# Patient Record
Sex: Female | Born: 1950 | Race: White | Hispanic: No | Marital: Married | Smoking: Never smoker
Health system: Southern US, Community
[De-identification: ages and names within clinical notes are randomized; demographics above are authoritative.]

## PROBLEM LIST (undated history)

## (undated) DIAGNOSIS — E119 Type 2 diabetes mellitus without complications: Secondary | ICD-10-CM

## (undated) DIAGNOSIS — M1990 Unspecified osteoarthritis, unspecified site: Secondary | ICD-10-CM

## (undated) DIAGNOSIS — R112 Nausea with vomiting, unspecified: Secondary | ICD-10-CM

## (undated) DIAGNOSIS — Z9889 Other specified postprocedural states: Secondary | ICD-10-CM

## (undated) DIAGNOSIS — T7840XA Allergy, unspecified, initial encounter: Secondary | ICD-10-CM

## (undated) DIAGNOSIS — F329 Major depressive disorder, single episode, unspecified: Secondary | ICD-10-CM

## (undated) DIAGNOSIS — F419 Anxiety disorder, unspecified: Secondary | ICD-10-CM

## (undated) DIAGNOSIS — H269 Unspecified cataract: Secondary | ICD-10-CM

## (undated) DIAGNOSIS — J449 Chronic obstructive pulmonary disease, unspecified: Secondary | ICD-10-CM

## (undated) DIAGNOSIS — J45909 Unspecified asthma, uncomplicated: Secondary | ICD-10-CM

## (undated) DIAGNOSIS — IMO0002 Reserved for concepts with insufficient information to code with codable children: Secondary | ICD-10-CM

## (undated) DIAGNOSIS — E785 Hyperlipidemia, unspecified: Secondary | ICD-10-CM

## (undated) DIAGNOSIS — I10 Essential (primary) hypertension: Secondary | ICD-10-CM

## (undated) DIAGNOSIS — F32A Depression, unspecified: Secondary | ICD-10-CM

## (undated) HISTORY — DX: Unspecified asthma, uncomplicated: J45.909

## (undated) HISTORY — PX: COLONOSCOPY WITH ESOPHAGOGASTRODUODENOSCOPY (EGD): SHX5779

## (undated) HISTORY — DX: Reserved for concepts with insufficient information to code with codable children: IMO0002

## (undated) HISTORY — PX: BREAST SURGERY: SHX581

## (undated) HISTORY — DX: Chronic obstructive pulmonary disease, unspecified: J44.9

## (undated) HISTORY — DX: Essential (primary) hypertension: I10

## (undated) HISTORY — PX: HAND SURGERY: SHX662

## (undated) HISTORY — DX: Unspecified cataract: H26.9

## (undated) HISTORY — PX: KNEE SURGERY: SHX244

## (undated) HISTORY — PX: EYE SURGERY: SHX253

## (undated) HISTORY — DX: Type 2 diabetes mellitus without complications: E11.9

## (undated) HISTORY — DX: Hyperlipidemia, unspecified: E78.5

## (undated) HISTORY — DX: Allergy, unspecified, initial encounter: T78.40XA

## (undated) HISTORY — DX: Major depressive disorder, single episode, unspecified: F32.9

## (undated) HISTORY — DX: Unspecified osteoarthritis, unspecified site: M19.90

## (undated) HISTORY — PX: BACK SURGERY: SHX140

## (undated) HISTORY — DX: Anxiety disorder, unspecified: F41.9

## (undated) HISTORY — DX: Depression, unspecified: F32.A

---

## 1997-11-16 ENCOUNTER — Ambulatory Visit (HOSPITAL_COMMUNITY): Admission: RE | Admit: 1997-11-16 | Discharge: 1997-11-16 | Payer: Self-pay | Admitting: Neurological Surgery

## 1997-12-30 ENCOUNTER — Encounter: Payer: Self-pay | Admitting: Neurological Surgery

## 1997-12-30 ENCOUNTER — Ambulatory Visit (HOSPITAL_COMMUNITY): Admission: RE | Admit: 1997-12-30 | Discharge: 1997-12-30 | Payer: Self-pay | Admitting: Neurological Surgery

## 1998-02-16 ENCOUNTER — Emergency Department (HOSPITAL_COMMUNITY): Admission: EM | Admit: 1998-02-16 | Discharge: 1998-02-16 | Payer: Self-pay | Admitting: Emergency Medicine

## 1998-12-07 ENCOUNTER — Other Ambulatory Visit: Admission: RE | Admit: 1998-12-07 | Discharge: 1998-12-07 | Payer: Self-pay | Admitting: Gynecology

## 1999-12-06 ENCOUNTER — Encounter: Payer: Self-pay | Admitting: Gynecology

## 1999-12-06 ENCOUNTER — Encounter: Admission: RE | Admit: 1999-12-06 | Discharge: 1999-12-06 | Payer: Self-pay | Admitting: Gynecology

## 2000-01-11 ENCOUNTER — Other Ambulatory Visit: Admission: RE | Admit: 2000-01-11 | Discharge: 2000-01-11 | Payer: Self-pay | Admitting: Gynecology

## 2000-10-16 ENCOUNTER — Encounter: Admission: RE | Admit: 2000-10-16 | Discharge: 2000-10-16 | Payer: Self-pay | Admitting: Neurological Surgery

## 2000-10-16 ENCOUNTER — Encounter: Payer: Self-pay | Admitting: Neurological Surgery

## 2001-03-17 ENCOUNTER — Other Ambulatory Visit: Admission: RE | Admit: 2001-03-17 | Discharge: 2001-03-17 | Payer: Self-pay | Admitting: Gynecology

## 2002-04-01 ENCOUNTER — Encounter: Admission: RE | Admit: 2002-04-01 | Discharge: 2002-04-01 | Payer: Self-pay | Admitting: Gynecology

## 2002-04-01 ENCOUNTER — Encounter: Payer: Self-pay | Admitting: Gynecology

## 2002-04-05 ENCOUNTER — Other Ambulatory Visit: Admission: RE | Admit: 2002-04-05 | Discharge: 2002-04-05 | Payer: Self-pay | Admitting: Gynecology

## 2002-12-07 ENCOUNTER — Encounter: Payer: Self-pay | Admitting: Family Medicine

## 2002-12-07 ENCOUNTER — Encounter: Admission: RE | Admit: 2002-12-07 | Discharge: 2002-12-07 | Payer: Self-pay | Admitting: Family Medicine

## 2003-07-25 ENCOUNTER — Other Ambulatory Visit: Admission: RE | Admit: 2003-07-25 | Discharge: 2003-07-25 | Payer: Self-pay | Admitting: Gynecology

## 2003-07-27 ENCOUNTER — Encounter: Admission: RE | Admit: 2003-07-27 | Discharge: 2003-07-27 | Payer: Self-pay | Admitting: Gynecology

## 2004-11-30 ENCOUNTER — Encounter: Admission: RE | Admit: 2004-11-30 | Discharge: 2004-11-30 | Payer: Self-pay | Admitting: Gynecology

## 2004-12-04 ENCOUNTER — Other Ambulatory Visit: Admission: RE | Admit: 2004-12-04 | Discharge: 2004-12-04 | Payer: Self-pay | Admitting: Gynecology

## 2005-12-30 ENCOUNTER — Other Ambulatory Visit: Admission: RE | Admit: 2005-12-30 | Discharge: 2005-12-30 | Payer: Self-pay | Admitting: Gynecology

## 2006-01-15 ENCOUNTER — Encounter: Admission: RE | Admit: 2006-01-15 | Discharge: 2006-01-15 | Payer: Self-pay | Admitting: Gynecology

## 2006-05-13 LAB — HM COLONOSCOPY: HM Colonoscopy: NORMAL

## 2007-04-13 LAB — HM PAP SMEAR: HM Pap smear: NORMAL

## 2007-07-20 ENCOUNTER — Ambulatory Visit: Payer: Self-pay | Admitting: Internal Medicine

## 2007-07-20 DIAGNOSIS — J209 Acute bronchitis, unspecified: Secondary | ICD-10-CM | POA: Insufficient documentation

## 2007-07-20 DIAGNOSIS — J45909 Unspecified asthma, uncomplicated: Secondary | ICD-10-CM | POA: Insufficient documentation

## 2007-07-20 LAB — CONVERTED CEMR LAB
Basophils Relative: 0.6 % (ref 0.0–1.0)
Eosinophils Absolute: 0.5 10*3/uL (ref 0.0–0.6)
Eosinophils Relative: 7.7 % — ABNORMAL HIGH (ref 0.0–5.0)
Hemoglobin: 12.6 g/dL (ref 12.0–15.0)
Neutro Abs: 2.9 10*3/uL (ref 1.4–7.7)
Platelets: 222 10*3/uL (ref 150–400)
RDW: 12.8 % (ref 11.5–14.6)
WBC: 6.5 10*3/uL (ref 4.5–10.5)

## 2007-09-08 ENCOUNTER — Encounter: Payer: Self-pay | Admitting: Internal Medicine

## 2007-09-08 ENCOUNTER — Ambulatory Visit: Payer: Self-pay | Admitting: Internal Medicine

## 2007-09-08 DIAGNOSIS — J309 Allergic rhinitis, unspecified: Secondary | ICD-10-CM | POA: Insufficient documentation

## 2007-10-19 ENCOUNTER — Encounter: Admission: RE | Admit: 2007-10-19 | Discharge: 2007-10-19 | Payer: Self-pay | Admitting: Internal Medicine

## 2007-10-22 ENCOUNTER — Ambulatory Visit: Payer: Self-pay

## 2008-06-20 LAB — TSH: TSH: 0.39 u[IU]/mL — AB (ref <=5.90)

## 2009-03-20 ENCOUNTER — Encounter: Admission: RE | Admit: 2009-03-20 | Discharge: 2009-03-20 | Payer: Self-pay | Admitting: Gynecology

## 2010-06-03 ENCOUNTER — Encounter: Payer: Self-pay | Admitting: Gynecology

## 2010-06-13 ENCOUNTER — Encounter: Payer: Self-pay | Admitting: Gynecology

## 2010-08-14 ENCOUNTER — Other Ambulatory Visit: Payer: Self-pay | Admitting: Gynecology

## 2010-08-14 DIAGNOSIS — R928 Other abnormal and inconclusive findings on diagnostic imaging of breast: Secondary | ICD-10-CM

## 2010-08-23 ENCOUNTER — Ambulatory Visit
Admission: RE | Admit: 2010-08-23 | Discharge: 2010-08-23 | Disposition: A | Discharge status: Home / Self Care | Payer: 59 | Source: Ambulatory Visit | Attending: Gynecology | Admitting: Gynecology

## 2010-08-23 DIAGNOSIS — R928 Other abnormal and inconclusive findings on diagnostic imaging of breast: Secondary | ICD-10-CM

## 2011-02-11 LAB — LIPID PANEL
Cholesterol: 254 mg/dL — AB (ref 0–200)
HDL: 51 mg/dL (ref 35–70)
LDL Cholesterol: 166 mg/dL
Triglycerides: 187 mg/dL — AB (ref 40–160)

## 2011-02-11 LAB — HEPATIC FUNCTION PANEL
ALT: 22 U/L (ref 7–35)
AST: 23 U/L (ref 13–35)

## 2011-04-22 ENCOUNTER — Ambulatory Visit (INDEPENDENT_AMBULATORY_CARE_PROVIDER_SITE_OTHER): Payer: 59

## 2011-04-22 DIAGNOSIS — Z23 Encounter for immunization: Secondary | ICD-10-CM

## 2011-04-22 DIAGNOSIS — H268 Other specified cataract: Secondary | ICD-10-CM

## 2011-04-22 DIAGNOSIS — Z0000 Encounter for general adult medical examination without abnormal findings: Secondary | ICD-10-CM

## 2011-08-07 ENCOUNTER — Other Ambulatory Visit: Payer: Self-pay | Admitting: Family Medicine

## 2011-08-08 ENCOUNTER — Other Ambulatory Visit: Payer: Self-pay | Admitting: Physician Assistant

## 2011-09-04 ENCOUNTER — Other Ambulatory Visit: Payer: Self-pay | Admitting: Family Medicine

## 2011-09-05 ENCOUNTER — Telehealth: Payer: Self-pay | Admitting: Internal Medicine

## 2011-09-05 MED ORDER — ALPRAZOLAM 1 MG PO TABS: 1.0000 mg | ORAL_TABLET | Freq: Every evening | ORAL | Status: AC | PRN

## 2011-09-05 NOTE — Telephone Encounter (Signed)
Xanax 1 mg #30 refilled

## 2011-09-30 ENCOUNTER — Other Ambulatory Visit: Payer: Self-pay | Admitting: Family Medicine

## 2011-10-02 ENCOUNTER — Ambulatory Visit (INDEPENDENT_AMBULATORY_CARE_PROVIDER_SITE_OTHER): Payer: 59 | Admitting: Family Medicine

## 2011-10-02 VITALS — BP 142/80 | HR 78 | Temp 98.8°F | Resp 16 | Ht 62.0 in | Wt 163.0 lb

## 2011-10-02 DIAGNOSIS — L0291 Cutaneous abscess, unspecified: Secondary | ICD-10-CM

## 2011-10-02 DIAGNOSIS — R52 Pain, unspecified: Secondary | ICD-10-CM

## 2011-10-02 MED ORDER — TRAMADOL HCL 50 MG PO TABS: 50.0000 mg | ORAL_TABLET | Freq: Three times a day (TID) | ORAL | Status: AC | PRN

## 2011-10-02 MED ORDER — CEPHALEXIN 500 MG PO CAPS: 500.0000 mg | ORAL_CAPSULE | Freq: Four times a day (QID) | ORAL | Status: DC

## 2011-10-02 MED ORDER — DOXYCYCLINE HYCLATE 100 MG PO TABS: 100.0000 mg | ORAL_TABLET | Freq: Two times a day (BID) | ORAL | Status: AC

## 2011-10-02 NOTE — Progress Notes (Signed)
  [facility]:  Office Visit  Chief Complaint:  Chief Complaint  Patient presents with  . Cellulitis    Left hand    HPI: Jodi Williams is a 61 y.o. female who complains of  4 day hisotry of wound on left hand near thumb, started out as pimple like, then looked like there might be fluid/pus underneath, patient took a pin needle to it to drain fluid. + pain, + redness. No abx or OTC treatment.   No past medical history on file. No past surgical history on file. History   Social History  . Marital Status: Married    Spouse Name: N/A    Number of Children: N/A  . Years of Education: N/A   Social History Main Topics  . Smoking status: Never Smoker   . Smokeless tobacco: None  . Alcohol Use: None  . Drug Use: None  . Sexually Active: None   Other Topics Concern  . None   Social History Narrative  . None   No family history on file. No Known Allergies Prior to Admission medications   Medication Sig Start Date End Date Taking? Authorizing Provider  ALPRAZolam Prudy Feeler) 1 MG tablet Take 1 tablet (1 mg total) by mouth at bedtime as needed for sleep. 09/05/11 10/05/11 Yes Rickard Patience, PA-C  buPROPion [facility] SR) 150 MG 12 hr tablet Take 1 tablet (150 mg total) by mouth 2 (two) times daily. NEEDS OFFICE VISIT 09/30/11  Yes Ryan M Dunn, PA-C  citalopram (CELEXA) 20 MG tablet TAKE 2 TABLETS BY MOUTH EVERY DAY 08/07/11  Yes Pattricia Boss, PA-C     ROS: The patient denies fevers, chills, night sweats, unintentional weight loss, chest pain, palpitations, wheezing, dyspnea on exertion, nausea, vomiting, abdominal pain, dysuria, hematuria, melena, numbness, weakness, or tingling. + wound  All other systems have been reviewed and were otherwise negative with the exception of those mentioned in the HPI and as above.    PHYSICAL EXAM: Filed Vitals:   10/02/11 1922  BP: 142/80  Pulse: 78  Temp: 98.8 F (37.1 C)  Resp: 16   Filed Vitals:   10/02/11 1922    Height: 5\' 2"  (1.575 m)  Weight: 163 lb (73.936 kg)   Body mass index is 29.81 kg/(m^2).  General: Alert, no acute distress HEENT:  Normocephalic, atraumatic, oropharynx patent.  Cardiovascular:  Regular rate and rhythm, no rubs murmurs or gallops.  No Carotid bruits, radial pulse intact. No pedal edema.  Respiratory: Clear to auscultation bilaterally.  No wheezes, rales, or rhonchi.  No cyanosis, no use of accessory musculature GI: No organomegaly, abdomen is soft and non-tender, positive bowel sounds.  No masses. Skin: +  2 by 2 cm well circumscribed abscess on left dorsum of hand at hyperthenar eminence. + pustular drainage, erythematous, tender Neurologic: Facial musculature symmetric. Radial pulses intact, full ROM Psychiatric: Patient is appropriate throughout our interaction. Lymphatic: No cervical lymphadenopathy Musculoskeletal: Gait intact.   LABS:    EKG/XRAY:   Primary read interpreted by Dr. Conley Rolls at [facility].   ASSESSMENT/PLAN: Encounter Diagnoses  Name Primary?  . Cellulitis and abscess Yes  . Pain    Patient declined I&D She was willing to allow [place] to get a wound cx ( she nearly passed out in the process) Rx Doxycycline 100 mg BID and also Tramadol for pain.     Deloria Brassfield PHUONG, DO 10/03/2011 1:59 AM

## 2011-10-03 ENCOUNTER — Encounter: Payer: Self-pay | Admitting: Family Medicine

## 2011-10-06 ENCOUNTER — Telehealth: Payer: Self-pay | Admitting: Family Medicine

## 2011-10-06 LAB — WOUND CULTURE
Gram Stain: NONE SEEN
Gram Stain: NONE SEEN
Gram Stain: NONE SEEN

## 2011-10-06 NOTE — Telephone Encounter (Signed)
Spoke with patient regarding wound cx, she is feeling better, cellulitis is improved.  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

## 2011-10-08 ENCOUNTER — Telehealth: Payer: Self-pay

## 2011-10-08 NOTE — Telephone Encounter (Signed)
Pt has mersa and Dr Patsy Lager told her not to go get her nails done she has a dental appt tomorrow should she still go to that?

## 2011-10-09 NOTE — Telephone Encounter (Signed)
Patient was dx with mrsa on 5/22 and would like to know if she is ok to go to dental app

## 2011-10-09 NOTE — Telephone Encounter (Signed)
Spoke with patient and let her know that it was ok to go to dentist.

## 2011-10-09 NOTE — Telephone Encounter (Signed)
Yes this is fine. Keep wound covered and continue medication

## 2011-10-14 ENCOUNTER — Other Ambulatory Visit: Payer: Self-pay | Admitting: Internal Medicine

## 2011-11-02 ENCOUNTER — Other Ambulatory Visit: Payer: Self-pay | Admitting: Physician Assistant

## 2011-11-13 ENCOUNTER — Other Ambulatory Visit: Payer: Self-pay | Admitting: Physician Assistant

## 2011-11-13 MED ORDER — ALPRAZOLAM 1 MG PO TABS: ORAL_TABLET | ORAL | Status: DC

## 2011-11-28 ENCOUNTER — Ambulatory Visit (INDEPENDENT_AMBULATORY_CARE_PROVIDER_SITE_OTHER): Payer: 59 | Admitting: Family Medicine

## 2011-11-28 VITALS — BP 114/72

## 2011-11-28 DIAGNOSIS — F329 Major depressive disorder, single episode, unspecified: Secondary | ICD-10-CM

## 2011-11-28 DIAGNOSIS — M25529 Pain in unspecified elbow: Secondary | ICD-10-CM

## 2011-11-28 DIAGNOSIS — F32A Depression, unspecified: Secondary | ICD-10-CM

## 2011-11-28 DIAGNOSIS — M7710 Lateral epicondylitis, unspecified elbow: Secondary | ICD-10-CM

## 2011-11-28 DIAGNOSIS — I10 Essential (primary) hypertension: Secondary | ICD-10-CM

## 2011-11-28 DIAGNOSIS — F3289 Other specified depressive episodes: Secondary | ICD-10-CM

## 2011-11-28 MED ORDER — TRIAMTERENE-HCTZ 37.5-25 MG PO TABS: 1.0000 | ORAL_TABLET | Freq: Every day | ORAL | Status: DC

## 2011-11-28 MED ORDER — CITALOPRAM HYDROBROMIDE 20 MG PO TABS: 40.0000 mg | ORAL_TABLET | Freq: Every day | ORAL | Status: DC

## 2011-11-28 MED ORDER — CELECOXIB 100 MG PO CAPS: 100.0000 mg | ORAL_CAPSULE | Freq: Two times a day (BID) | ORAL | Status: DC

## 2011-11-28 MED ORDER — HYDROCODONE-ACETAMINOPHEN 5-500 MG PO TABS: 1.0000 | ORAL_TABLET | Freq: Three times a day (TID) | ORAL | Status: AC | PRN

## 2011-11-28 NOTE — Patient Instructions (Addendum)
Please let me know if your elbow is not better soon.  You may try applying ice and using a tennis elbow strap or band (available at drug stores).  You can probably call [facility] and make an appt yourself if you do not improve.

## 2011-11-28 NOTE — Progress Notes (Signed)
[facility] [address], [place] [address] [phone]  Date:  11/28/2011   Name:  Jodi Williams   DOB:  1950/10/18   MRN:  [id number]  PCP:  No primary provider on file.    Chief Complaint: Arm Pain   History of Present Illness:  Jodi Williams is a 61 y.o. very pleasant female patient who presents with the following:  She noted some pain in her left elbow a couple of months ago.  He pain has now progressed to hurting up and down her arm, mostly on the radial side.  Her hands/ fingers are normal- no numbness or weakness.  No known injury.  She does tend to carry a lot of things in her left hand.  Shoulder is ok.  The pain is affecting her sleep and she would like something to help with this- received ultram recently and it did not seem to give her any pain relief.   Patient Active Problem List  Diagnosis  . ACUTE BRONCHITIS  . ALLERGIC RHINITIS WITH CONJUNCTIVITIS  . ASTHMA    Past Medical History  Diagnosis Date  . Depression   . Anxiety     No past surgical history on file.  History  Substance Use Topics  . Smoking status: Never Smoker   . Smokeless tobacco: Not on file  . Alcohol Use: Not on file    No family history on file.  No Known Allergies  Medication list has been reviewed and updated.  Current Outpatient Prescriptions on File Prior to Visit  Medication Sig Dispense Refill  . ALPRAZolam (XANAX) 1 MG tablet 1 hs prn  30 tablet  0  . buPROPion (WELLBUTRIN SR) 150 MG 12 hr tablet TAKE 1 TABLET BY MOUTH TWICE DAILY  60 tablet  0  . citalopram (CELEXA) 20 MG tablet TAKE 2 TABLETS BY MOUTH EVERY DAY  60 tablet  1  . RABEprazole (ACIPHEX) 20 MG tablet Take 20 mg by mouth daily.      Marland Kitchen triamterene-hydrochlorothiazide (MAXZIDE-25) 37.5-25 MG per tablet Take 1 tablet by mouth daily.        Review of Systems:  As per HPI- otherwise negative.   Physical Examination: Filed Vitals:   11/28/11 1521  BP: 114/72   There were no vitals  filed for this visit. There is no height or weight on file to calculate BMI. Ideal Body Weight:    GEN: WDWN, NAD, Non-toxic, A & O x 3 HEENT: Atraumatic, Normocephalic. Neck supple. No masses, No LAD. Ears and Nose: No external deformity. CV: RRR, No M/G/R. No JVD. No thrill. No extra heart sounds. PULM: CTA B, no wheezes, crackles, rhonchi. No retractions. No resp. distress. No accessory muscle use. EXTR: No c/c/e NEURO Normal gait.  PSYCH: Normally interactive. Conversant. Not depressed or anxious appearing.  Calm demeanor.  Left arm: there is tenderness over her lateral epicondlye.  She also has pain with resisted supination- otherwise normal elbow and shoulder ROM, normal strength, sensation, and biceps DTR.   Assessment and Plan: 1. Lateral epicondylitis  celecoxib (CELEBREX) 100 MG capsule  2. Depression  citalopram (CELEXA) 20 MG tablet  3. HTN (hypertension)  triamterene-hydrochlorothiazide (MAXZIDE-25) 37.5-25 MG per tablet  4. Elbow pain  HYDROcodone-acetaminophen (VICODIN) 5-500 MG per tablet, celecoxib (CELEBREX) 100 MG capsule   Refilled other medications as above per her request. She may take her celebrex BID temporarily if needed.  Did give a few vicodin as well per her request.  Went over treatment of tennis elbow- she wants to try conservative treatment first.  Gave handout of exercises for lateral epicondylitis.  If not better she will consider seeing ortho for an injection.  Her husband is a patient at [facility] orth[facility] and she would like to go there.   Abbe Amsterdam, MD

## 2011-12-14 ENCOUNTER — Other Ambulatory Visit: Payer: Self-pay | Admitting: Physician Assistant

## 2012-01-13 ENCOUNTER — Telehealth: Payer: Self-pay | Admitting: Physician Assistant

## 2012-01-15 ENCOUNTER — Other Ambulatory Visit: Payer: Self-pay | Admitting: Family Medicine

## 2012-02-04 ENCOUNTER — Other Ambulatory Visit: Payer: Self-pay | Admitting: Physician Assistant

## 2012-02-10 ENCOUNTER — Other Ambulatory Visit: Payer: Self-pay | Admitting: Physician Assistant

## 2012-02-11 ENCOUNTER — Telehealth: Payer: Self-pay

## 2012-02-11 NOTE — Telephone Encounter (Signed)
Pt called req RF of her Xanax. Herbert Seta is responding to RF request. Called in Rx to pharm

## 2012-03-08 ENCOUNTER — Other Ambulatory Visit: Payer: Self-pay | Admitting: Family Medicine

## 2012-03-13 ENCOUNTER — Other Ambulatory Visit: Payer: Self-pay | Admitting: Gynecology

## 2012-03-13 DIAGNOSIS — Z1231 Encounter for screening mammogram for malignant neoplasm of breast: Secondary | ICD-10-CM

## 2012-03-13 DIAGNOSIS — Z9889 Other specified postprocedural states: Secondary | ICD-10-CM

## 2012-03-15 ENCOUNTER — Telehealth: Payer: Self-pay

## 2012-03-15 MED ORDER — ALPRAZOLAM 1 MG PO TABS: 1.0000 mg | ORAL_TABLET | Freq: Every evening | ORAL | Status: DC | PRN

## 2012-03-15 NOTE — Telephone Encounter (Signed)
No electronic request received from her pharmacy.  Rx printed.

## 2012-03-15 NOTE — Telephone Encounter (Signed)
Pt has called her pharmacy and requested a rx refill on xanax and she has  Heard no response. Pt states she cant sleep without them please contact pt to advise. [phone]

## 2012-03-16 NOTE — Telephone Encounter (Signed)
Called in RX to [place], called pt to let her know, LMOM.

## 2012-04-02 ENCOUNTER — Ambulatory Visit
Admission: RE | Admit: 2012-04-02 | Discharge: 2012-04-02 | Disposition: A | Discharge status: Home / Self Care | Payer: 59 | Source: Ambulatory Visit | Attending: Gynecology | Admitting: Gynecology

## 2012-04-02 DIAGNOSIS — Z9889 Other specified postprocedural states: Secondary | ICD-10-CM

## 2012-04-02 DIAGNOSIS — Z1231 Encounter for screening mammogram for malignant neoplasm of breast: Secondary | ICD-10-CM

## 2012-04-08 ENCOUNTER — Other Ambulatory Visit: Payer: Self-pay | Admitting: Family Medicine

## 2012-04-12 ENCOUNTER — Ambulatory Visit (INDEPENDENT_AMBULATORY_CARE_PROVIDER_SITE_OTHER): Payer: 59 | Admitting: Family Medicine

## 2012-04-12 ENCOUNTER — Encounter: Payer: Self-pay | Admitting: Family Medicine

## 2012-04-12 ENCOUNTER — Encounter: Payer: Self-pay | Admitting: Physician Assistant

## 2012-04-12 ENCOUNTER — Other Ambulatory Visit: Payer: Self-pay | Admitting: Physician Assistant

## 2012-04-12 VITALS — BP 128/80 | HR 86 | Temp 98.1°F | Resp 18 | Ht 62.0 in | Wt 168.0 lb

## 2012-04-12 DIAGNOSIS — Z8711 Personal history of peptic ulcer disease: Secondary | ICD-10-CM | POA: Insufficient documentation

## 2012-04-12 DIAGNOSIS — M1710 Unilateral primary osteoarthritis, unspecified knee: Secondary | ICD-10-CM

## 2012-04-12 DIAGNOSIS — M179 Osteoarthritis of knee, unspecified: Secondary | ICD-10-CM

## 2012-04-12 DIAGNOSIS — F3289 Other specified depressive episodes: Secondary | ICD-10-CM

## 2012-04-12 DIAGNOSIS — Z23 Encounter for immunization: Secondary | ICD-10-CM

## 2012-04-12 DIAGNOSIS — Z0000 Encounter for general adult medical examination without abnormal findings: Secondary | ICD-10-CM

## 2012-04-12 DIAGNOSIS — R82998 Other abnormal findings in urine: Secondary | ICD-10-CM

## 2012-04-12 DIAGNOSIS — F418 Other specified anxiety disorders: Secondary | ICD-10-CM | POA: Insufficient documentation

## 2012-04-12 DIAGNOSIS — Z8719 Personal history of other diseases of the digestive system: Secondary | ICD-10-CM | POA: Insufficient documentation

## 2012-04-12 DIAGNOSIS — M47815 Spondylosis without myelopathy or radiculopathy, thoracolumbar region: Secondary | ICD-10-CM | POA: Insufficient documentation

## 2012-04-12 DIAGNOSIS — IMO0002 Reserved for concepts with insufficient information to code with codable children: Secondary | ICD-10-CM

## 2012-04-12 DIAGNOSIS — F32A Depression, unspecified: Secondary | ICD-10-CM

## 2012-04-12 DIAGNOSIS — K219 Gastro-esophageal reflux disease without esophagitis: Secondary | ICD-10-CM

## 2012-04-12 DIAGNOSIS — F329 Major depressive disorder, single episode, unspecified: Secondary | ICD-10-CM

## 2012-04-12 DIAGNOSIS — I10 Essential (primary) hypertension: Secondary | ICD-10-CM

## 2012-04-12 DIAGNOSIS — M479 Spondylosis, unspecified: Secondary | ICD-10-CM

## 2012-04-12 DIAGNOSIS — R739 Hyperglycemia, unspecified: Secondary | ICD-10-CM

## 2012-04-12 LAB — POCT CBC
HCT, POC: 44 % (ref 37.7–47.9)
Hemoglobin: 13.8 g/dL (ref 12.2–16.2)
MCH, POC: 28.5 pg (ref 27–31.2)
MCV: 91 fL (ref 80–97)
MPV: 7.9 fL (ref 0–99.8)
RBC: 4.84 M/uL (ref 4.04–5.48)
WBC: 6.5 10*3/uL (ref 4.6–10.2)

## 2012-04-12 LAB — COMPREHENSIVE METABOLIC PANEL
ALT: 20 U/L (ref 0–35)
AST: 15 U/L (ref 0–37)
Albumin: 4.8 g/dL (ref 3.5–5.2)
BUN: 24 mg/dL — ABNORMAL HIGH (ref 6–23)
Calcium: 9.8 mg/dL (ref 8.4–10.5)
Chloride: 99 mEq/L (ref 96–112)
Potassium: 4.7 mEq/L (ref 3.5–5.3)

## 2012-04-12 LAB — POCT URINALYSIS DIPSTICK
Bilirubin, UA: NEGATIVE
Blood, UA: NEGATIVE
Glucose, UA: NEGATIVE
Ketones, UA: NEGATIVE
Nitrite, UA: NEGATIVE
Spec Grav, UA: 1.015

## 2012-04-12 LAB — POCT UA - MICROSCOPIC ONLY
Casts, Ur, LPF, POC: NEGATIVE
Crystals, Ur, HPF, POC: NEGATIVE

## 2012-04-12 LAB — LIPID PANEL: HDL: 56 mg/dL (ref >39)

## 2012-04-12 LAB — POCT GLYCOSYLATED HEMOGLOBIN (HGB A1C): Hemoglobin A1C: 6.2

## 2012-04-12 LAB — TSH: TSH: 0.915 u[IU]/mL (ref 0.350–4.500)

## 2012-04-12 MED ORDER — METHOCARBAMOL 750 MG PO TABS: 750.0000 mg | ORAL_TABLET | Freq: Every day | ORAL | Status: DC

## 2012-04-12 MED ORDER — TRIAMTERENE-HCTZ 37.5-25 MG PO TABS: 1.0000 | ORAL_TABLET | Freq: Every day | ORAL | Status: DC

## 2012-04-12 MED ORDER — NIACIN ER (ANTIHYPERLIPIDEMIC) 500 MG PO TBCR: 500.0000 mg | EXTENDED_RELEASE_TABLET | Freq: Every day | ORAL | Status: DC

## 2012-04-12 MED ORDER — ALPRAZOLAM 1 MG PO TABS: 1.0000 mg | ORAL_TABLET | Freq: Every evening | ORAL | Status: DC | PRN

## 2012-04-12 MED ORDER — BUPROPION HCL ER (SR) 150 MG PO TB12: 150.0000 mg | ORAL_TABLET | Freq: Two times a day (BID) | ORAL | Status: DC

## 2012-04-12 MED ORDER — CELECOXIB 100 MG PO CAPS
100.0000 mg | ORAL_CAPSULE | Freq: Two times a day (BID) | ORAL | Status: DC
Start: 2012-04-12 — End: 2013-07-26

## 2012-04-12 MED ORDER — CITALOPRAM HYDROBROMIDE 20 MG PO TABS: 40.0000 mg | ORAL_TABLET | Freq: Every day | ORAL | Status: DC

## 2012-04-12 MED ORDER — RABEPRAZOLE SODIUM 20 MG PO TBEC: 20.0000 mg | DELAYED_RELEASE_TABLET | Freq: Every day | ORAL | Status: DC

## 2012-04-12 NOTE — Progress Notes (Signed)
[address] [place] [address]   Phone [phone]  Subjective:    Patient ID: Jodi Williams, female    DOB: May 31, 1950, 61 y.o.   MRN: [id number]  HPI Pt here for CPE.  She is doing well. She just had her mammogram.  She is seeing someone at [facility] for her arthritis.  She sees Dr. Nicholas Lose for her GYN care.     Review of Systems  Constitutional: Negative.   HENT: Negative.   Eyes: Negative.   Respiratory: Negative.   Cardiovascular: Negative.   Gastrointestinal: Negative.   Genitourinary: Negative.   Musculoskeletal: Positive for back pain and arthralgias (knees).  Skin: Negative.   Neurological: Negative.   Hematological: Negative.   Psychiatric/Behavioral: Negative.        Objective:   Physical Exam  Vitals reviewed. Constitutional: She is oriented to person, place, and time. She appears well-developed and well-nourished.  HENT:  Head: Normocephalic and atraumatic.  Right Ear: Hearing, tympanic membrane, external ear and ear canal normal.  Left Ear: Hearing, tympanic membrane, external ear and ear canal normal.  Nose: Nose normal.  Mouth/Throat: Uvula is midline, oropharynx is clear and moist and mucous membranes are normal.  Eyes: Conjunctivae normal, EOM and lids are normal. Pupils are equal, round, and reactive to light.  Neck: Normal range of motion. Neck supple. Carotid bruit is not present. No thyromegaly present.  Cardiovascular: Normal rate, regular rhythm, normal heart sounds and intact distal pulses.  Exam reveals no gallop.   No murmur heard. Pulmonary/Chest: Effort normal and breath sounds normal.  Abdominal: Soft.  Musculoskeletal: Normal range of motion.  Lymphadenopathy:    She has no cervical adenopathy.  Neurological: She is alert and oriented to person, place, and time.  Skin: Skin is warm and dry.  Psychiatric: She has a normal mood and affect. Her behavior is normal. Judgment and thought content normal.    Results for orders placed in  visit on 04/12/12  POCT CBC      Component Value Range   WBC 6.5  4.6 - 10.2 K/uL   Lymph, poc 1.6  0.6 - 3.4   POC LYMPH PERCENT 24.2  10 - 50 %L   MID (cbc) 0.4  0 - 0.9   POC MID % 6.3  0 - 12 %M   POC Granulocyte 4.5  2 - 6.9   Granulocyte percent 69.5  37 - 80 %G   RBC 4.84  4.04 - 5.48 M/uL   Hemoglobin 13.8  12.2 - 16.2 g/dL   HCT, POC 13.2  44.0 - 47.9 %   MCV 91.0  80 - 97 fL   MCH, POC 28.5  27 - 31.2 pg   MCHC 31.4 (*) 31.8 - 35.4 g/dL   RDW, POC 10.2     Platelet Count, POC 300  142 - 424 K/uL   MPV 7.9  0 - 99.8 fL  POCT URINALYSIS DIPSTICK      Component Value Range   Color, UA yellow     Clarity, UA clear     Glucose, UA neg     Bilirubin, UA neg     Ketones, UA neg     Spec Grav, UA 1.015     Blood, UA neg     pH, UA 7.5     Protein, UA neg     Urobilinogen, UA 0.2     Nitrite, UA neg     Leukocytes, UA small (1+)    POCT  UA - MICROSCOPIC ONLY      Component Value Range   WBC, Ur, HPF, POC 0-1     RBC, urine, microscopic neg     Bacteria, U Microscopic trace     Mucus, UA neg     Epithelial cells, urine per micros 0-1     Crystals, Ur, HPF, POC neg     Casts, Ur, LPF, POC neg     Yeast, UA neg     EKG reading - NSR no acute changes.    Assessment & Plan:   1. Annual physical exam  POCT CBC, POCT urinalysis dipstick, TSH  2. HTN (hypertension)  Comprehensive metabolic panel, Lipid panel, EKG 12-Lead, triamterene-hydrochlorothiazide (MAXZIDE-25) 37.5-25 MG per tablet  3. OA (osteoarthritis) of knee  celecoxib (CELEBREX) 100 MG capsule  4. Osteoarthritis of back  methocarbamol (ROBAXIN) 750 MG tablet, celecoxib (CELEBREX) 100 MG capsule  5. Flu vaccine need  Flu vaccine greater than or equal to 3yo preservative free IM  6. Depression  citalopram (CELEXA) 20 MG tablet, buPROPion (WELLBUTRIN SR) 150 MG 12 hr tablet, ALPRAZolam (XANAX) 1 MG tablet  7. GERD (gastroesophageal reflux disease)  RABEprazole (ACIPHEX) 20 MG tablet  8. Leukocytes in urine   POCT UA - Microscopic Only   1- filled out insurance form 2- refilled meds 3- continue medications and care at [facility] 4- continue medications and care of [facility] 5- gave flu vaccine 6- continue meds - ok to fill xanax for 6 months 7- refilled meds 8- urine ok on micro  D/w Dr Katrinka Blazing

## 2012-04-12 NOTE — Progress Notes (Signed)
Below note reviewed; EKG reviewed.  Agree with assessment and plan as outlined by Benny Lennert, PA-C.

## 2012-04-18 NOTE — Progress Notes (Signed)
Reviewed and agree.

## 2012-05-19 ENCOUNTER — Other Ambulatory Visit: Payer: Self-pay | Admitting: Physician Assistant

## 2012-05-23 ENCOUNTER — Other Ambulatory Visit: Payer: Self-pay | Admitting: Physician Assistant

## 2012-05-23 NOTE — Telephone Encounter (Signed)
Pt states she needs a refill on her xanax Pharmacy [facility] on [address]

## 2012-05-25 ENCOUNTER — Other Ambulatory Visit: Payer: Self-pay | Admitting: Physician Assistant

## 2012-05-25 NOTE — Telephone Encounter (Signed)
Pt is needing to talk with someone about her zanax it has been denied twice and the pharmacy told her we told them it was denied but handled a different way

## 2012-05-26 ENCOUNTER — Telehealth: Payer: Self-pay

## 2012-05-26 NOTE — Telephone Encounter (Signed)
Pt called and states [facility] on [address] states they never recd the rx we sent over on 05/20/12 Can we resubmit this to the pharmacy or call them? Please advise pt

## 2012-05-27 NOTE — Telephone Encounter (Signed)
Called in Rx again and notified pt.

## 2012-05-29 ENCOUNTER — Telehealth: Payer: Self-pay | Admitting: Radiology

## 2012-05-29 NOTE — Addendum Note (Signed)
Addended byCaffie Damme on: 05/29/2012 10:31 AM   Modules accepted: Orders

## 2012-05-29 NOTE — Telephone Encounter (Signed)
Called her to advise. This message was sent to Rx requests, not to patient calls

## 2012-05-29 NOTE — Telephone Encounter (Signed)
Was sent on 05/19/12

## 2012-05-29 NOTE — Telephone Encounter (Signed)
Called she had called last week, I apologized for delay and advised her the Rx was sent, she has gotten this already.

## 2012-05-29 NOTE — Telephone Encounter (Signed)
Was sent to [facility] for her

## 2012-09-07 ENCOUNTER — Ambulatory Visit (INDEPENDENT_AMBULATORY_CARE_PROVIDER_SITE_OTHER): Payer: 59 | Admitting: Internal Medicine

## 2012-09-07 VITALS — BP 122/76 | HR 83 | Temp 98.0°F | Resp 17 | Ht 62.5 in | Wt 176.0 lb

## 2012-09-07 DIAGNOSIS — R739 Hyperglycemia, unspecified: Secondary | ICD-10-CM

## 2012-09-07 DIAGNOSIS — M25561 Pain in right knee: Secondary | ICD-10-CM

## 2012-09-07 DIAGNOSIS — E785 Hyperlipidemia, unspecified: Secondary | ICD-10-CM

## 2012-09-07 DIAGNOSIS — Z6831 Body mass index (BMI) 31.0-31.9, adult: Secondary | ICD-10-CM

## 2012-09-07 DIAGNOSIS — R1011 Right upper quadrant pain: Secondary | ICD-10-CM

## 2012-09-07 DIAGNOSIS — F329 Major depressive disorder, single episode, unspecified: Secondary | ICD-10-CM

## 2012-09-07 DIAGNOSIS — M25569 Pain in unspecified knee: Secondary | ICD-10-CM

## 2012-09-07 DIAGNOSIS — M25511 Pain in right shoulder: Secondary | ICD-10-CM

## 2012-09-07 DIAGNOSIS — F411 Generalized anxiety disorder: Secondary | ICD-10-CM

## 2012-09-07 DIAGNOSIS — G8929 Other chronic pain: Secondary | ICD-10-CM

## 2012-09-07 DIAGNOSIS — G2581 Restless legs syndrome: Secondary | ICD-10-CM

## 2012-09-07 DIAGNOSIS — R7309 Other abnormal glucose: Secondary | ICD-10-CM

## 2012-09-07 DIAGNOSIS — F32A Depression, unspecified: Secondary | ICD-10-CM

## 2012-09-07 LAB — POCT CBC
Granulocyte percent: 63.4 %G (ref 37–80)
HCT, POC: 41.9 % (ref 37.7–47.9)
MCH, POC: 27.8 pg (ref 27–31.2)
MCV: 89 fL (ref 80–97)
MID (cbc): 0.4 (ref 0–0.9)
POC LYMPH PERCENT: 29.1 %L (ref 10–50)
RBC: 4.71 M/uL (ref 4.04–5.48)
WBC: 4.8 10*3/uL (ref 4.6–10.2)

## 2012-09-07 LAB — POCT GLYCOSYLATED HEMOGLOBIN (HGB A1C): Hemoglobin A1C: 6.4

## 2012-09-07 MED ORDER — CLONAZEPAM 2 MG PO TABS: ORAL_TABLET | ORAL | Status: DC

## 2012-09-07 MED ORDER — BUPROPION HCL ER (XL) 300 MG PO TB24: 300.0000 mg | ORAL_TABLET | Freq: Every day | ORAL | Status: DC

## 2012-09-07 MED ORDER — CLONAZEPAM 2 MG PO TABS: 2.0000 mg | ORAL_TABLET | Freq: Two times a day (BID) | ORAL | Status: DC | PRN

## 2012-09-07 MED ORDER — HYDROCODONE-ACETAMINOPHEN 10-325 MG PO TABS: ORAL_TABLET | ORAL | Status: DC
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
## 2012-09-08 ENCOUNTER — Encounter: Payer: Self-pay | Admitting: Internal Medicine

## 2012-09-08 LAB — COMPREHENSIVE METABOLIC PANEL
ALT: 22 U/L (ref 0–35)
Alkaline Phosphatase: 87 U/L (ref 39–117)
CO2: 30 mEq/L (ref 19–32)
Creat: 0.85 mg/dL (ref 0.50–1.10)
Glucose, Bld: 101 mg/dL — ABNORMAL HIGH (ref 70–99)
Total Bilirubin: 0.4 mg/dL (ref 0.3–1.2)

## 2012-09-09 ENCOUNTER — Telehealth: Payer: Self-pay

## 2012-09-09 NOTE — Telephone Encounter (Signed)
Pt is calling back Call back number is [phone]

## 2012-09-09 NOTE — Telephone Encounter (Signed)
Labs are normal Mail ov and labs for her to take to Dr Jalene Mullet We can schedule RUQ [place] before she goes if she would like; patient states she will see Dr Randa Evens and proceed with the [place] if he needs it.

## 2012-09-30 ENCOUNTER — Other Ambulatory Visit: Payer: Self-pay | Admitting: Gastroenterology

## 2012-09-30 DIAGNOSIS — R1011 Right upper quadrant pain: Secondary | ICD-10-CM

## 2012-10-06 ENCOUNTER — Ambulatory Visit
Admission: RE | Admit: 2012-10-06 | Discharge: 2012-10-06 | Disposition: A | Discharge status: Home / Self Care | Payer: 59 | Source: Ambulatory Visit | Attending: Gastroenterology | Admitting: Gastroenterology

## 2012-10-06 DIAGNOSIS — R1011 Right upper quadrant pain: Secondary | ICD-10-CM

## 2012-10-07 ENCOUNTER — Other Ambulatory Visit: Payer: Self-pay | Admitting: Internal Medicine

## 2012-10-12 NOTE — Telephone Encounter (Signed)
Meds ordered this encounter  Medications  . clonazePAM (KLONOPIN) 2 MG tablet    Sig: TAKE 1 TABLET BY MOUTH AT BEDTIME    Dispense:  30 tablet    Refill:  0

## 2012-12-10 ENCOUNTER — Other Ambulatory Visit: Payer: Self-pay | Admitting: Family Medicine

## 2012-12-11 ENCOUNTER — Other Ambulatory Visit: Payer: Self-pay | Admitting: Internal Medicine

## 2012-12-11 ENCOUNTER — Other Ambulatory Visit: Payer: Self-pay | Admitting: Family Medicine

## 2012-12-11 NOTE — Telephone Encounter (Signed)
Dr Merla Riches, it looks like you ordered clonazepam for pt in April. Do you want her on the alprazolam also?

## 2013-01-16 ENCOUNTER — Other Ambulatory Visit: Payer: Self-pay | Admitting: Internal Medicine

## 2013-01-17 NOTE — Telephone Encounter (Signed)
At her last visit we changed to Klonopin at bedtime/this is the medicine needs refilling or is she changing back to Xanax?

## 2013-01-22 NOTE — Telephone Encounter (Signed)
Called pt who reported that they had talked about the clonazepam at last OV but never ended up switching her so she has never tried it yet. Pt just p/up her Rx of alprazolam, but does report that it really doesn't help her that much. Transferred pt to 104 for appt, but pt may call back when this RF of alprazolam gets low to see if Dr Merla Riches could send in a Rx of clonazepam for her to try if she hasn't seen him yet. Dr Merla Riches, [place]

## 2013-02-19 ENCOUNTER — Other Ambulatory Visit: Payer: Self-pay | Admitting: Internal Medicine

## 2013-02-19 NOTE — Telephone Encounter (Signed)
RX called in .

## 2013-02-24 ENCOUNTER — Ambulatory Visit: Payer: 59 | Admitting: Physician Assistant

## 2013-03-21 ENCOUNTER — Other Ambulatory Visit: Payer: Self-pay | Admitting: Internal Medicine

## 2013-04-04 ENCOUNTER — Ambulatory Visit (INDEPENDENT_AMBULATORY_CARE_PROVIDER_SITE_OTHER): Payer: 59 | Admitting: Emergency Medicine

## 2013-04-04 VITALS — BP 120/76 | HR 89 | Temp 98.2°F | Resp 17 | Ht 62.0 in | Wt 176.0 lb

## 2013-04-04 DIAGNOSIS — Z0000 Encounter for general adult medical examination without abnormal findings: Secondary | ICD-10-CM

## 2013-04-04 DIAGNOSIS — Z23 Encounter for immunization: Secondary | ICD-10-CM

## 2013-04-04 LAB — COMPREHENSIVE METABOLIC PANEL
ALT: 21 U/L (ref 0–35)
CO2: 28 mEq/L (ref 19–32)
Chloride: 100 mEq/L (ref 96–112)
Creat: 0.83 mg/dL (ref 0.50–1.10)
Glucose, Bld: 133 mg/dL — ABNORMAL HIGH (ref 70–99)
Potassium: 4.2 mEq/L (ref 3.5–5.3)
Sodium: 139 mEq/L (ref 135–145)
Total Protein: 7.2 g/dL (ref 6.0–8.3)

## 2013-04-04 LAB — TSH: TSH: 1.098 u[IU]/mL (ref 0.350–4.500)

## 2013-04-04 LAB — LIPID PANEL
HDL: 45 mg/dL (ref >39)
LDL Cholesterol: 162 mg/dL — ABNORMAL HIGH (ref 0–99)
Total CHOL/HDL Ratio: 5.8 Ratio
Triglycerides: 274 mg/dL — ABNORMAL HIGH (ref <150)
VLDL: 55 mg/dL — ABNORMAL HIGH (ref 0–40)

## 2013-04-04 LAB — POCT UA - MICROSCOPIC ONLY
Casts, Ur, LPF, POC: NEGATIVE
Crystals, Ur, HPF, POC: NEGATIVE
Renal tubular cells: POSITIVE

## 2013-04-04 LAB — POCT CBC
Granulocyte percent: 69.1 %G (ref 37–80)
MCV: 93 fL (ref 80–97)
MID (cbc): 0.4 (ref 0–0.9)
MPV: 8.1 fL (ref 0–99.8)
POC LYMPH PERCENT: 25.4 %L (ref 10–50)
POC MID %: 5.5 %M (ref 0–12)
Platelet Count, POC: 268 10*3/uL (ref 142–424)
RBC: 4.67 M/uL (ref 4.04–5.48)
RDW, POC: 15 %

## 2013-04-04 LAB — POCT URINALYSIS DIPSTICK
Ketones, UA: NEGATIVE
Nitrite, UA: NEGATIVE
pH, UA: 6.5

## 2013-04-04 NOTE — Progress Notes (Signed)
[facility] [address] [place] [address] [phone]  Date:  04/04/2013   Name:  Jodi Williams   DOB:  14-Oct-1950   MRN:  [id number]  PCP:  Jodi Amsterdam, MD    Chief Complaint: Annual Exam   History of Present Illness:  Jodi Williams is a 62 y.o. very pleasant female patient who presents with the following:  Multiple medical problems under treatment.  No adverse effects of treatment.  No flu shot.  Current on colonoscopy.  Nonsmoker.  Scheduled for mammo and pap.  For wellness examination.  Fasting.  Denies other complaint or health concern today.   Patient Active Problem List   Diagnosis Date Noted  . Other and unspecified hyperlipidemia 09/07/2012  . HTN (hypertension) 04/12/2012  . OA (osteoarthritis) of knee 04/12/2012  . 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 Depression with anxiety 04/12/2012  . H/O gastric ulcer 04/12/2012  . ALLERGIC RHINITIS WITH CONJUNCTIVITIS 09/08/2007  . ASTHMA 07/20/2007    Past Medical History  Diagnosis Date  . Depression   . Anxiety   . Allergy   . Arthritis   . Cataract   . Ulcer   . Hyperlipidemia   . Hypertension   . Asthma     Past Surgical History  Procedure Laterality Date  . Breast surgery    . Back surgery    . Hand surgery    . Eye surgery      cataract    History  Substance Use Topics  . Smoking status: Never Smoker   . Smokeless tobacco: Not on file  . Alcohol Use: No    Family History  Problem Relation Age of Onset  . Hyperlipidemia Mother   . Hypertension Mother   . Heart disease Mother   . Dementia Mother   . Arthritis Mother   . Neuropathy Father   . Diabetes Father   . Kidney disease Father     Allergies  Allergen Reactions  . Statins   . Omnicef [Cefdinir] Rash    Medication list has been reviewed and updated.  Current Outpatient Prescriptions on File Prior to Visit  Medication Sig Dispense Refill  . acetaminophen (TYLENOL) 650 MG CR tablet Take 650 mg by  mouth every 8 (eight) hours as needed.      . ALPRAZolam (XANAX) 1 MG tablet TAKE 1 TABLET BY MOUTH EVERY NIGHT AT BEDTIME AS NEEDED FOR SLEEP  30 tablet  0  . aspirin 81 MG tablet Take 81 mg by mouth daily.      Marland Kitchen buPROPion (WELLBUTRIN XL) 300 MG 24 hr tablet TAKE 1 TABLET BY MOUTH EVERY DAY  30 tablet  2  . celecoxib (CELEBREX) 100 MG capsule Take 1 capsule (100 mg total) by mouth 2 (two) times daily.  60 capsule  5  . citalopram (CELEXA) 20 MG tablet Take 2 tablets (40 mg total) by mouth daily.  60 tablet  5  . citalopram (CELEXA) 20 MG tablet TAKE 2 TABLETS BY MOUTH DAILY  60 tablet  2  . clonazePAM (KLONOPIN) 2 MG tablet TAKE 1 TABLET BY MOUTH AT BEDTIME  30 tablet  0  . HYDROcodone-acetaminophen (NORCO) 10-325 MG per tablet 1/2 to one at bedtime  30 tablet  0  . methocarbamol (ROBAXIN) 750 MG tablet Take 1 tablet (750 mg total) by mouth at bedtime.  30 tablet  5  . Omega-3 Fatty Acids (FISH OIL) 1000 MG CAPS Take 3 capsules by mouth daily.      Marland Kitchen  RABEprazole (ACIPHEX) 20 MG tablet Take 1 tablet (20 mg total) by mouth daily.  30 tablet  5  . Red Yeast Rice Extract (RED YEAST RICE PO) Take 1 tablet by mouth daily.      Marland Kitchen triamterene-hydrochlorothiazide (MAXZIDE-25) 37.5-25 MG per tablet Take 1 each (1 tablet total) by mouth daily.  30 tablet  5  . triamterene-hydrochlorothiazide (MAXZIDE-25) 37.5-25 MG per tablet TAKE 1 TABLET BY MOUTH EVERY DAY  30 tablet  2   No current facility-administered medications on file prior to visit.    Review of Systems:  As per HPI, otherwise negative.    Physical Examination: Filed Vitals:   04/04/13 0756  BP: 120/76  Pulse: 89  Temp: 98.2 F (36.8 C)  Resp: 17   Filed Vitals:   04/04/13 0756  Height: 5\' 2"  (1.575 m)  Weight: 176 lb (79.833 kg)   Body mass index is 32.18 kg/(m^2). Ideal Body Weight: Weight in (lb) to have BMI = 25: 136.4  GEN: WDWN, NAD, Non-toxic, A & O x 3 HEENT: Atraumatic, Normocephalic. Neck supple. No masses, No  LAD. Ears and Nose: No external deformity. CV: RRR, No M/G/R. No JVD. No thrill. No extra heart sounds. PULM: CTA B, no wheezes, crackles, rhonchi. No retractions. No resp. distress. No accessory muscle use. ABD: S, NT, ND, +BS. No rebound. No HSM. EXTR: No c/c/e NEURO Normal gait.  PSYCH: Normally interactive. Conversant. Not depressed or anxious appearing.  Calm demeanor.    Assessment and Plan: Wellness examination Labs pending Flu shot  Signed,  Phillips Odor, MD

## 2013-04-04 NOTE — Patient Instructions (Signed)
Calorie Counting Diet A calorie counting diet requires you to eat the number of calories that are right for you in a day. Calories are the measurement of how much energy you get from the food you eat. Eating the right amount of calories is important for staying at a healthy weight. If you eat too many calories, your body will store them as fat and you may gain weight. If you eat too few calories, you may lose weight. Counting the number of calories you eat during a day will help you know if you are eating the right amount. A Registered Dietitian can determine how many calories you need in a day. The amount of calories needed varies from person to person. If your goal is to lose weight, you will need to eat fewer calories. Losing weight can benefit you if you are overweight or have health problems such as heart disease, high blood pressure, or diabetes. If your goal is to gain weight, you will need to eat more calories. Gaining weight may be necessary if you have a certain health problem that causes your body to need more energy. TIPS Whether you are increasing or decreasing the number of calories you eat during a day, it may be hard to get used to changes in what you eat and drink. The following are tips to help you keep track of the number of calories you eat.  Measure foods at home with measuring cups. This helps you know the amount of food and number of calories you are eating.  Restaurants often serve food in amounts that are larger than 1 serving. While eating out, estimate how many servings of a food you are given. For example, a serving of cooked rice is  cup or about the size of half of a fist. Knowing serving sizes will help you be aware of how much food you are eating at restaurants.  Ask for smaller portion sizes or child-size portions at restaurants.  Plan to eat half of a meal at a restaurant. Take the rest home or share the other half with a friend.  Read the Nutrition Facts panel on  food labels for calorie content and serving size. You can find out how many servings are in a package, the size of a serving, and the number of calories each serving has.  For example, a package might contain 3 cookies. The Nutrition Facts panel on that package says that 1 serving is 1 cookie. Below that, it will say there are 3 servings in the container. The calories section of the Nutrition Facts label says there are 90 calories. This means there are 90 calories in 1 cookie (1 serving). If you eat 1 cookie you have eaten 90 calories. If you eat all 3 cookies, you have eaten 270 calories (3 servings x 90 calories = 270 calories). The list below tells you how big or small some common portion sizes are.  1 oz.........4 stacked dice.  3 oz.........Deck of cards.  1 tsp........Tip of little finger.  1 tbs........Thumb.  2 tbs........Golf ball.   cup.......Half of a fist.  1 cup........A fist. KEEP A FOOD LOG Write down every food item you eat, the amount you eat, and the number of calories in each food you eat during the day. At the end of the day, you can add up the total number of calories you have eaten. It may help to keep a list like the one below. Find out the calorie information by reading the   Nutrition Facts panel on food labels. Breakfast  Bran cereal (1 cup, 110 calories).  Fat-free milk ( cup, 45 calories). Snack  Apple (1 medium, 80 calories). Lunch  Spinach (1 cup, 20 calories).  Tomato ( medium, 20 calories).  Chicken breast strips (3 oz, 165 calories).  Shredded cheddar cheese ( cup, 110 calories).  Light Italian dressing (2 tbs, 60 calories).  Whole-wheat bread (1 slice, 80 calories).  Tub margarine (1 tsp, 35 calories).  Vegetable soup (1 cup, 160 calories). Dinner  Pork chop (3 oz, 190 calories).  Brown rice (1 cup, 215 calories).  Steamed broccoli ( cup, 20 calories).  Strawberries (1  cup, 65 calories).  Whipped cream (1 tbs, 50  calories). Daily Calorie Total: 1425 Document Released: 04/29/2005 Document Revised: 07/22/2011 Document Reviewed: 10/24/2006 [employer] Patient Information 2014 [employer].  

## 2013-04-04 NOTE — Addendum Note (Signed)
Addended byAlden Benjamin R on: 04/04/2013 08:42 AM   Modules accepted: Orders

## 2013-04-05 ENCOUNTER — Other Ambulatory Visit: Payer: Self-pay | Admitting: Emergency Medicine

## 2013-04-05 DIAGNOSIS — R7309 Other abnormal glucose: Secondary | ICD-10-CM

## 2013-04-22 ENCOUNTER — Other Ambulatory Visit: Payer: Self-pay | Admitting: Internal Medicine

## 2013-04-23 NOTE — Telephone Encounter (Signed)
Please review

## 2013-04-25 ENCOUNTER — Other Ambulatory Visit: Payer: Self-pay | Admitting: Physician Assistant

## 2013-05-19 ENCOUNTER — Other Ambulatory Visit: Payer: Self-pay

## 2013-05-19 DIAGNOSIS — Z1231 Encounter for screening mammogram for malignant neoplasm of breast: Secondary | ICD-10-CM

## 2013-05-19 DIAGNOSIS — Z9889 Other specified postprocedural states: Secondary | ICD-10-CM

## 2013-05-20 ENCOUNTER — Ambulatory Visit
Admission: RE | Admit: 2013-05-20 | Discharge: 2013-05-20 | Disposition: A | Discharge status: Home / Self Care | Payer: 59 | Source: Ambulatory Visit

## 2013-05-20 DIAGNOSIS — Z1231 Encounter for screening mammogram for malignant neoplasm of breast: Secondary | ICD-10-CM

## 2013-05-20 DIAGNOSIS — Z9889 Other specified postprocedural states: Secondary | ICD-10-CM

## 2013-05-27 ENCOUNTER — Other Ambulatory Visit: Payer: Self-pay | Admitting: Internal Medicine

## 2013-05-28 NOTE — Telephone Encounter (Signed)
Ready

## 2013-06-27 ENCOUNTER — Other Ambulatory Visit: Payer: Self-pay | Admitting: Physician Assistant

## 2013-07-01 ENCOUNTER — Other Ambulatory Visit: Payer: Self-pay

## 2013-07-01 NOTE — Telephone Encounter (Signed)
Patient says she was seen recently in December 2014 for a physical and is needing a refill on her xanax, she see's [employer] architect. Patient says she wants a refilll and doesn't understand why she was denied when she went to pick it up. Please advise does patient need ov or can this be refilled?   [phone]

## 2013-07-05 NOTE — Telephone Encounter (Signed)
Dr Laney Pastor, you were the last to see pt for anxiety and Rx this even though Dr Lorelei Pont is listed as her PCP, and it looks like you have been filling it for pt. Let me know when you want to see pt again and I'll let her know, and also discuss controlled subst policy and need to choose 1 provider.

## 2013-07-05 NOTE — Telephone Encounter (Signed)
Dr Jodi Williams did her CPE.  I have not seen her in >1 year.  I gave her a refill in Jan but she needs to pick 1 provider to see about this due to our controlled substance policy and she will need an OV to discuss this with that provider.  (please explain this to the patient)

## 2013-07-05 NOTE — Telephone Encounter (Signed)
Dr. Lorelei Pont is preferred provider.   Needs xanax to sleep.   Patient is happy to discuss this via phone, since she had a CPE in November.   [phone]   Pharmacy - [facility] [address].

## 2013-07-06 MED ORDER — ALPRAZOLAM 1 MG PO TABS: ORAL_TABLET | ORAL | Status: DC

## 2013-07-07 NOTE — Telephone Encounter (Signed)
Faxed RF and called pt. D//w her contr subst policy and need to see same provider every 6 mos to manage that med. She stated she would like to see Dr Lorelei Pont and was excited to hear that she is now taking appts. Transferred her to 104 to set up appt.

## 2013-07-26 ENCOUNTER — Encounter: Payer: Self-pay | Admitting: Family Medicine

## 2013-07-26 ENCOUNTER — Ambulatory Visit (INDEPENDENT_AMBULATORY_CARE_PROVIDER_SITE_OTHER): Payer: 59 | Admitting: Family Medicine

## 2013-07-26 VITALS — BP 132/78 | HR 81 | Temp 98.2°F | Resp 16 | Ht 61.5 in | Wt 177.2 lb

## 2013-07-26 DIAGNOSIS — M1710 Unilateral primary osteoarthritis, unspecified knee: Secondary | ICD-10-CM

## 2013-07-26 DIAGNOSIS — R229 Localized swelling, mass and lump, unspecified: Secondary | ICD-10-CM

## 2013-07-26 DIAGNOSIS — G4700 Insomnia, unspecified: Secondary | ICD-10-CM

## 2013-07-26 DIAGNOSIS — IMO0002 Reserved for concepts with insufficient information to code with codable children: Secondary | ICD-10-CM

## 2013-07-26 DIAGNOSIS — M179 Osteoarthritis of knee, unspecified: Secondary | ICD-10-CM

## 2013-07-26 DIAGNOSIS — M47815 Spondylosis without myelopathy or radiculopathy, thoracolumbar region: Secondary | ICD-10-CM

## 2013-07-26 DIAGNOSIS — M479 Spondylosis, unspecified: Secondary | ICD-10-CM

## 2013-07-26 MED ORDER — CLONAZEPAM 2 MG PO TABS: ORAL_TABLET | ORAL | Status: DC

## 2013-07-26 MED ORDER — CELECOXIB 100 MG PO CAPS: 100.0000 mg | ORAL_CAPSULE | Freq: Two times a day (BID) | ORAL | Status: DC

## 2013-07-26 NOTE — Progress Notes (Signed)
[facility] [address], [place] [address] [phone]  Date:  07/26/2013   Name:  Jodi Williams   DOB:  Jan 14, 1951   MRN:  [id number]  PCP:  Lamar Blinks, MD    Chief Complaint: Medication Refill and Nevus   History of Present Illness:  Jodi Williams is a 63 y.o. very pleasant female patient who presents with the following:  Here today for a med refill and to look at a couple of moles.  In addition she notes that she has felt more nervous and upset lately.  She lost both of her parents in the last year.    Her mother was very ill, she was in and out of the hospital for about a year which was very stressful.   She has a small nodule on her right shin that has been present for years- has not seemed to change.  However she would like to have this checked.  She also has a couple of moles on her abdomen.    She does not have a history of skin cancer.   She is taking celexa 20 mg, as well as wellbutrin.   She has noted a tremor in her hands for the last few months; not sure if this is due to stress.  Reports that she saw a neurologist at some point many years ago- she had a ?MRI that showed "some spots on my brain."  She is not really sure what this was allabout, has not continued to follow-up She is taking xanax before bed- however it does not seem to help her sleep a lot of the time.  She had talked about klonopin in the past but she never took it as far as she can recall.  It was rx a few months ago but she does not think she ever took it.   She does not really feel that she is overly depressed, but feels very stressed and anxious.   Labs done in November 2014.    She has a history of OA in her legs- she is not sure about having surgery for this  Patient Active Problem List   Diagnosis Date Noted  . Other and unspecified hyperlipidemia 09/07/2012  . HTN (hypertension) 04/12/2012  . OA (osteoarthritis) of knee 04/12/2012  . Osteoarthritis of back  04/12/2012  . Depression with anxiety 04/12/2012  . H/O gastric ulcer 04/12/2012  . ALLERGIC RHINITIS WITH CONJUNCTIVITIS 09/08/2007  . ASTHMA 07/20/2007    Past Medical History  Diagnosis Date  . Depression   . Anxiety   . Allergy   . Arthritis   . Cataract   . Ulcer   . Hyperlipidemia   . Hypertension   . Asthma     Past Surgical History  Procedure Laterality Date  . Breast surgery    . Back surgery    . Hand surgery    . Eye surgery      cataract    History  Substance Use Topics  . Smoking status: Never Smoker   . Smokeless tobacco: Not on file  . Alcohol Use: No    Family History  Problem Relation Age of Onset  . Hyperlipidemia Mother   . Hypertension Mother   . Heart disease Mother   . Dementia Mother   . Arthritis Mother   . Neuropathy Father   . Diabetes Father   . Kidney disease Father     Allergies  Allergen Reactions  . Statins   .  Omnicef [Cefdinir] Rash    Medication list has been reviewed and updated.  Current Outpatient Prescriptions on File Prior to Visit  Medication Sig Dispense Refill  . ALPRAZolam (XANAX) 1 MG tablet TAKE 1 TABLET BY MOUTH BY MOUTH EVERY NIGHT AT BEDTIME AS NEEDED FOR SLEEP  30 tablet  0  . aspirin 81 MG tablet Take 81 mg by mouth daily.      Marland Kitchen buPROPion (WELLBUTRIN XL) 300 MG 24 hr tablet TAKE 1 TABLET BY MOUTH EVERY DAY  30 tablet  2  . celecoxib (CELEBREX) 100 MG capsule Take 1 capsule (100 mg total) by mouth 2 (two) times daily.  60 capsule  5  . citalopram (CELEXA) 20 MG tablet TAKE 2 TABLETS BY MOUTH DAILY  60 tablet  2  . Omega-3 Fatty Acids (FISH OIL) 1000 MG CAPS Take 3 capsules by mouth daily.      . RABEprazole (ACIPHEX) 20 MG tablet Take 1 tablet (20 mg total) by mouth daily.  30 tablet  5  . Red Yeast Rice Extract (RED YEAST RICE PO) Take 1 tablet by mouth daily.      Marland Kitchen triamterene-hydrochlorothiazide (MAXZIDE-25) 37.5-25 MG per tablet TAKE 1 TABLET BY MOUTH EVERY DAY  30 tablet  5  . acetaminophen  (TYLENOL) 650 MG CR tablet Take 650 mg by mouth every 8 (eight) hours as needed.      . citalopram (CELEXA) 20 MG tablet TAKE 2 TABLETS BY MOUTH EVERY DAY  60 tablet  5  . clonazePAM (KLONOPIN) 2 MG tablet TAKE 1 TABLET BY MOUTH AT BEDTIME  30 tablet  0  . HYDROcodone-acetaminophen (NORCO) 10-325 MG per tablet 1/2 to one at bedtime  30 tablet  0  . methocarbamol (ROBAXIN) 750 MG tablet Take 1 tablet (750 mg total) by mouth at bedtime.  30 tablet  5  . triamterene-hydrochlorothiazide (MAXZIDE-25) 37.5-25 MG per tablet TAKE 1 TABLET BY MOUTH EVERY DAY  30 tablet  2   No current facility-administered medications on file prior to visit.    Review of Systems:  As per HPI- otherwise negative.   Physical Examination: Filed Vitals:   07/26/13 0834  BP: 132/78  Pulse: 81  Temp: 98.2 F (36.8 C)  Resp: 16   Filed Vitals:   07/26/13 0834  Height: 5' 1.5" (1.562 m)  Weight: 177 lb 3.2 oz (80.377 kg)   Body mass index is 32.94 kg/(m^2). Ideal Body Weight: Weight in (lb) to have BMI = 25: 134.2  GEN: WDWN, NAD, Non-toxic, A & O x 3, overweight, looks well HEENT: Atraumatic, Normocephalic. Neck supple. No masses, No LAD. Ears and Nose: No external deformity. CV: RRR, No M/G/R. No JVD. No thrill. No extra heart sounds. PULM: CTA B, no wheezes, crackles, rhonchi. No retractions. No resp. distress. No accessory muscle use. ABD: S, NT, ND, +BS. No rebound. No HSM. EXTR: No c/c/e NEURO Normal gait.  PSYCH: Normally interactive. Conversant. Not depressed or anxious appearing.  Calm demeanor.  Right lower leg; she has a small, firm nodule She has a few cherry angiomas on her trunk.  These are the "moles" that were concerning her.   VC obtained. Prepped area on right shin with betadine and alcohol. Anesthesia with 2% lidocaine.  Using usual aseptic technique, removed nodule with 68mm punch bx.  Closed wound with 1 HM suture and 2 SI sutures, 4.0 prolene.  Specimen to path, pt tolerated the  procedure well.  EBL 3 ml   Assessment and Plan: Skin  nodule - Plan: Dermatology pathology  OA (osteoarthritis) of knee - Plan: celecoxib (CELEBREX) 100 MG capsule  Osteoarthritis of back - Plan: celecoxib (CELEBREX) 100 MG capsule  Insomnia - Plan: clonazePAM (KLONOPIN) 2 MG tablet  Removed nodule on leg and sent to path.  Likely benign.  Refilled her celebrex that she uses for arthritis  anxiety/ tremor of hands/ insomnia.  Continue celexa and wellbutrin.  She will try klonopin at bedtime instead of xanax.  Will have her start with 1mg  SR next week Need more time to discuss her other symptoms, hand tremor and anxiety  Signed Lamar Blinks, MD

## 2013-07-26 NOTE — Patient Instructions (Signed)
Let's try klonopin at bedtime- take a 1/2 first, but you can go to a whole if needed.    Please come and see me next Tuesday afternoon at the walk- in and I will take your stitches out.  If you have any sign of infection such as redness or heat please let me know right away.    Let's meet again in 3 weeks or so to discuss your anxiety and see how you are doing with your sleep and mood  WOUND CARE Please return in 8 days to have your stitches/staples removed or sooner if you have concerns. Marland Kitchen Keep area clean and dry for 24 hours. Do not remove bandage, if applied. . After 24 hours, remove bandage and wash wound gently with mild soap and warm water. Reapply a new bandage after cleaning wound, if directed. . Continue daily cleansing with soap and water until stitches/staples are removed. . Do not apply any ointments or creams to the wound while stitches/staples are in place, as this may cause delayed healing. . Notify the office if you experience any of the following signs of infection: Swelling, redness, pus drainage, streaking, fever >101.0 F . Notify the office if you experience excessive bleeding that does not stop after 15-20 minutes of constant, firm pressure.

## 2013-07-29 ENCOUNTER — Other Ambulatory Visit: Payer: Self-pay | Admitting: Internal Medicine

## 2013-08-03 ENCOUNTER — Ambulatory Visit (INDEPENDENT_AMBULATORY_CARE_PROVIDER_SITE_OTHER): Payer: 59 | Admitting: Family Medicine

## 2013-08-03 VITALS — BP 122/84 | HR 76 | Temp 98.3°F | Resp 18 | Ht 61.5 in | Wt 177.5 lb

## 2013-08-03 DIAGNOSIS — Z4802 Encounter for removal of sutures: Secondary | ICD-10-CM

## 2013-08-03 NOTE — Progress Notes (Signed)
[facility] [address] [place] [address] [phone]  Date:  08/03/2013   Name:  Jodi Williams   DOB:  1951/01/29   MRN:  [id number]  PCP:  Lamar Blinks, MD    Chief Complaint: Suture / Staple Removal   History of Present Illness:  Jodi Williams is a 63 y.o. very pleasant female patient who presents with the following:  Here today for suture removal. Placed #3 sutures at site of a punch bx on her right shin 8 days ago.  She has no complaint or concern, area is not painful and seems to be doing well  Patient Active Problem List   Diagnosis Date Noted  . Other and unspecified hyperlipidemia 09/07/2012  . HTN (hypertension) 04/12/2012  . OA (osteoarthritis) of knee 04/12/2012  . 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 Depression with anxiety 04/12/2012  . H/O gastric ulcer 04/12/2012  . ALLERGIC RHINITIS WITH CONJUNCTIVITIS 09/08/2007  . ASTHMA 07/20/2007    Past Medical History  Diagnosis Date  . Depression   . Anxiety   . Allergy   . Arthritis   . Cataract   . Ulcer   . Hyperlipidemia   . Hypertension   . Asthma     Past Surgical History  Procedure Laterality Date  . Breast surgery    . Back surgery    . Hand surgery    . Eye surgery      cataract    History  Substance Use Topics  . Smoking status: Never Smoker   . Smokeless tobacco: Not on file  . Alcohol Use: No    Family History  Problem Relation Age of Onset  . Hyperlipidemia Mother   . Hypertension Mother   . Heart disease Mother   . Dementia Mother   . Arthritis Mother   . Neuropathy Father   . Diabetes Father   . Kidney disease Father     Allergies  Allergen Reactions  . Statins   . Omnicef [Cefdinir] Rash    Medication list has been reviewed and updated.  Current Outpatient Prescriptions on File Prior to Visit  Medication Sig Dispense Refill  . acetaminophen (TYLENOL) 650 MG CR tablet Take 650 mg by mouth every 8 (eight) hours as needed.      Marland Kitchen aspirin  81 MG tablet Take 81 mg by mouth daily.      Marland Kitchen buPROPion (WELLBUTRIN XL) 300 MG 24 hr tablet TAKE 1 TABLET BY MOUTH EVERY DAY  30 tablet  5  . celecoxib (CELEBREX) 100 MG capsule Take 1 capsule (100 mg total) by mouth 2 (two) times daily. Use as needed for knee pain  60 capsule  5  . citalopram (CELEXA) 20 MG tablet TAKE 2 TABLETS BY MOUTH EVERY DAY  60 tablet  5  . clonazePAM (KLONOPIN) 2 MG tablet TAKE 1/2 or 1 TABLET BY MOUTH AT BEDTIME  30 tablet  2  . Omega-3 Fatty Acids (FISH OIL) 1000 MG CAPS Take 3 capsules by mouth daily.      . RABEprazole (ACIPHEX) 20 MG tablet Take 1 tablet (20 mg total) by mouth daily.  30 tablet  5  . Red Yeast Rice Extract (RED YEAST RICE PO) Take 1 tablet by mouth daily.      Marland Kitchen triamterene-hydrochlorothiazide (MAXZIDE-25) 37.5-25 MG per tablet TAKE 1 TABLET BY MOUTH EVERY DAY  30 tablet  5   No current facility-administered medications on file prior to visit.    Review  of Systems:  As per HPI- otherwise negative.   Physical Examination: Filed Vitals:   08/03/13 1844  BP: 122/84  Pulse: 76  Temp: 98.3 F (36.8 C)  Resp: 18   Filed Vitals:   08/03/13 1844  Height: 5' 1.5" (1.562 m)  Weight: 177 lb 8 oz (80.513 kg)   Body mass index is 33 kg/(m^2). Ideal Body Weight: Weight in (lb) to have BMI = 25: 134.2   GEN: WDWN, NAD, Non-toxic, Alert & Oriented x 3 HEENT: Atraumatic, Normocephalic.  Ears and Nose: No external deformity. EXTR: No clubbing/cyanosis/edema NEURO: Normal gait.  PSYCH: Normally interactive. Conversant. Not depressed or anxious appearing.  Calm demeanor.  Removed #3 sutures from right lower leg.  Wound appears to be healing well and there is no sign of infection.    Assessment and Plan: Visit for suture removal  Still awaiting skin pathology; will inquire with lab as I would expect results soon.  Otherwise follow-up as needed  Signed Lamar Blinks, MD

## 2013-08-04 ENCOUNTER — Telehealth: Payer: Self-pay | Admitting: Family Medicine

## 2013-08-04 NOTE — Telephone Encounter (Signed)
Patient notified biopsy results. Benign lesion can occur with leg shaving, trauma, or insect bites. Voiced understanding.

## 2013-08-23 ENCOUNTER — Ambulatory Visit (INDEPENDENT_AMBULATORY_CARE_PROVIDER_SITE_OTHER): Payer: 59 | Admitting: Family Medicine

## 2013-08-23 ENCOUNTER — Encounter: Payer: Self-pay | Admitting: Family Medicine

## 2013-08-23 VITALS — BP 124/78 | HR 75 | Temp 99.1°F | Resp 16 | Ht 62.0 in | Wt 176.0 lb

## 2013-08-23 DIAGNOSIS — F411 Generalized anxiety disorder: Secondary | ICD-10-CM

## 2013-08-23 DIAGNOSIS — R251 Tremor, unspecified: Secondary | ICD-10-CM

## 2013-08-23 DIAGNOSIS — R7309 Other abnormal glucose: Secondary | ICD-10-CM

## 2013-08-23 DIAGNOSIS — R259 Unspecified abnormal involuntary movements: Secondary | ICD-10-CM

## 2013-08-23 DIAGNOSIS — E119 Type 2 diabetes mellitus without complications: Secondary | ICD-10-CM

## 2013-08-23 LAB — BASIC METABOLIC PANEL
BUN: 24 mg/dL — ABNORMAL HIGH (ref 6–23)
CHLORIDE: 101 meq/L (ref 96–112)
CO2: 24 meq/L (ref 19–32)
Calcium: 9.6 mg/dL (ref 8.4–10.5)
Creat: 0.96 mg/dL (ref 0.50–1.10)
Glucose, Bld: 145 mg/dL — ABNORMAL HIGH (ref 70–99)
Potassium: 3.9 mEq/L (ref 3.5–5.3)
SODIUM: 137 meq/L (ref 135–145)

## 2013-08-23 LAB — HEMOGLOBIN A1C
Hgb A1c MFr Bld: 7.2 % — ABNORMAL HIGH (ref <5.7)
Mean Plasma Glucose: 160 mg/dL — ABNORMAL HIGH (ref <117)

## 2013-08-23 LAB — TSH: TSH: 0.864 u[IU]/mL (ref 0.350–4.500)

## 2013-08-23 NOTE — Patient Instructions (Signed)
We will set up your MRI and be in touch with you asap.  Take care!  Consider trying that support group

## 2013-08-23 NOTE — Progress Notes (Addendum)
[facility] [address] [place] [address] [phone]  Date:  08/23/2013   Name:  Jodi Williams   DOB:  Sep 29, 1950   MRN:  [id number]  PCP:  Lamar Blinks, MD    Chief Complaint: Anxiety   History of Present Illness:  Jodi Williams is a 63 y.o. very pleasant female patient who presents with the following:  Here today to discuss anxiety.  We talked about this about one month ago- she is on celexa and wellbutrin, and we used klonopin instead of xanax at bedtime for insomnia.   She is taking 2mg  of klonopin at bedtime. "If I go to sleep, it's all right."  I'm always tired.  Her depression sx seem to "be ok."  She does feel irritable sometimes with certain people.  Mainly she has a lot of conflict with her mother's wife; she feels like she oversteps her bounds in dealing with her parents' estate.   She has noted a tremor in her hands for about 4 months.  This seemed to start after her mother died.  She had seen a neurologist about 20 years ago; cannot remember who she saw, and she thinks this doctor is no longer in town.  She had an MRI and was told she had some "spots on her brain." She was told she had "the beginning stages of MS."  She has not followed up.    She has a contact with Hospice for a support group, but she does not want to be involved in this.  She feels that she is not actually grieving her parents at this point as they were so very ill prior to their deaths- "they were so bad off I would not want them back like they were."  She feels like she is just stressed, but admits this could be part of her grieving process  Patient Active Problem List   Diagnosis Date Noted  . Other and unspecified hyperlipidemia 09/07/2012  . HTN (hypertension) 04/12/2012  . OA (osteoarthritis) of knee 04/12/2012  . 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 Depression with anxiety 04/12/2012  . H/O gastric ulcer 04/12/2012  . ALLERGIC RHINITIS WITH CONJUNCTIVITIS 09/08/2007   . ASTHMA 07/20/2007    Past Medical History  Diagnosis Date  . Depression   . Anxiety   . Allergy   . Arthritis   . Cataract   . Ulcer   . Hyperlipidemia   . Hypertension   . Asthma     Past Surgical History  Procedure Laterality Date  . Breast surgery    . Back surgery    . Hand surgery    . Eye surgery      cataract    History  Substance Use Topics  . Smoking status: Never Smoker   . Smokeless tobacco: Not on file  . Alcohol Use: No    Family History  Problem Relation Age of Onset  . Hyperlipidemia Mother   . Hypertension Mother   . Heart disease Mother   . Dementia Mother   . Arthritis Mother   . Neuropathy Father   . Diabetes Father   . Kidney disease Father     Allergies  Allergen Reactions  . Statins   . Omnicef [Cefdinir] Rash    Medication list has been reviewed and updated.  Current Outpatient Prescriptions on File Prior to Visit  Medication Sig Dispense Refill  . aspirin 81 MG tablet Take 81 mg by mouth daily.      Marland Kitchen  buPROPion (WELLBUTRIN XL) 300 MG 24 hr tablet TAKE 1 TABLET BY MOUTH EVERY DAY  30 tablet  5  . celecoxib (CELEBREX) 100 MG capsule Take 1 capsule (100 mg total) by mouth 2 (two) times daily. Use as needed for knee pain  60 capsule  5  . citalopram (CELEXA) 20 MG tablet TAKE 2 TABLETS BY MOUTH EVERY DAY  60 tablet  5  . clonazePAM (KLONOPIN) 2 MG tablet TAKE 1/2 or 1 TABLET BY MOUTH AT BEDTIME  30 tablet  2  . Omega-3 Fatty Acids (FISH OIL) 1000 MG CAPS Take 3 capsules by mouth daily.      . Red Yeast Rice Extract (RED YEAST RICE PO) Take 1 tablet by mouth daily.      Marland Kitchen triamterene-hydrochlorothiazide (MAXZIDE-25) 37.5-25 MG per tablet TAKE 1 TABLET BY MOUTH EVERY DAY  30 tablet  5  . acetaminophen (TYLENOL) 650 MG CR tablet Take 650 mg by mouth every 8 (eight) hours as needed.      . RABEprazole (ACIPHEX) 20 MG tablet Take 1 tablet (20 mg total) by mouth daily.  30 tablet  5   No current facility-administered medications on  file prior to visit.    Review of Systems:  As per HPI- otherwise negative.   Physical Examination: Filed Vitals:   08/23/13 0841  BP: 124/78  Pulse: 75  Temp: 99.1 F (37.3 C)  Resp: 16   Filed Vitals:   08/23/13 0841  Height: 5\' 2"  (1.575 m)  Weight: 176 lb (79.833 kg)   Body mass index is 32.18 kg/(m^2). Ideal Body Weight: Weight in (lb) to have BMI = 25: 136.4  GEN: WDWN, NAD, Non-toxic, A & O x 3, overweight, looks well HEENT: Atraumatic, Normocephalic. Neck supple. No masses, No LAD. Ears and Nose: No external deformity. CV: RRR, No M/G/R. No JVD. No thrill. No extra heart sounds. PULM: CTA B, no wheezes, crackles, rhonchi. No retractions. No resp. distress. No accessory muscle use. ABD: S, NT, ND, +BS. No rebound. No HSM. EXTR: No c/c/e NEURO Normal gait.  PSYCH: Normally interactive. Conversant. Not depressed or anxious appearing.  Calm demeanor.    Assessment and Plan: Generalized anxiety disorder - Plan: TSH, Basic metabolic panel  Elevated glucose - Plan: Hemoglobin A1c, CANCELED: POCT glycosylated hemoglobin (Hb A1C)  Tremor - Plan: MR Brain W Wo Contrast  Await A1c; noted borderline elevated glucose recently Continue current medications for anxiety; she is doing better with her sleep Send for MRI brain to evaluate possible past dx of MS.   Otherwise I do think her sx of tremor, irritability and fatigue are due in large part to her grief. Encouraged her to participate in the hospice support group and she will think about it.   Will plan further follow- up pending labs.  Signed Lamar Blinks, MD  Called on 4/14 to discuss labs.  unfortunately she does now have DM.  She understands this well as both her parents had the same.  Encouraged her to read the ADA web site, will start metformin, and she will work towards moderate weight loss with diet and exercise changes.  Her husband has a meter which she will start using to check her glucose about once a  week.  Plan to recheck in 3 months.

## 2013-08-24 ENCOUNTER — Encounter: Payer: Self-pay | Admitting: Family Medicine

## 2013-08-24 DIAGNOSIS — E119 Type 2 diabetes mellitus without complications: Secondary | ICD-10-CM | POA: Insufficient documentation

## 2013-08-24 MED ORDER — METFORMIN HCL 500 MG PO TABS: 500.0000 mg | ORAL_TABLET | Freq: Two times a day (BID) | ORAL | Status: DC

## 2013-08-24 NOTE — Addendum Note (Signed)
Addended by: Lamar Blinks C on: 08/24/2013 03:03 PM   Modules accepted: Orders

## 2013-08-30 ENCOUNTER — Other Ambulatory Visit: Payer: Self-pay | Admitting: *Deleted

## 2013-08-30 DIAGNOSIS — G35 Multiple sclerosis: Secondary | ICD-10-CM

## 2013-08-30 DIAGNOSIS — R251 Tremor, unspecified: Secondary | ICD-10-CM

## 2013-08-30 NOTE — Progress Notes (Unsigned)
Cancelled MR referral from 4/14 to send request to the insurance company with the correct dx code. Butch Penny notified.

## 2013-09-07 ENCOUNTER — Ambulatory Visit
Admission: RE | Admit: 2013-09-07 | Discharge: 2013-09-07 | Disposition: A | Discharge status: Home / Self Care | Payer: 59 | Source: Ambulatory Visit | Attending: Family Medicine | Admitting: Family Medicine

## 2013-09-07 ENCOUNTER — Encounter: Payer: Self-pay | Admitting: Family Medicine

## 2013-09-07 ENCOUNTER — Telehealth: Payer: Self-pay | Admitting: Family Medicine

## 2013-09-07 DIAGNOSIS — G35 Multiple sclerosis: Secondary | ICD-10-CM

## 2013-09-07 DIAGNOSIS — R251 Tremor, unspecified: Secondary | ICD-10-CM

## 2013-09-07 MED ORDER — GADOBENATE DIMEGLUMINE 529 MG/ML IV SOLN
15.0000 mL | Freq: Once | INTRAVENOUS | Status: AC | PRN
  Administered 2013-09-07: 15 mL via INTRAVENOUS

## 2013-09-07 NOTE — Telephone Encounter (Signed)
LMOM that her MRI is normal, no sign of MS.  Will send a copy

## 2013-09-30 ENCOUNTER — Telehealth: Payer: Self-pay

## 2013-11-02 ENCOUNTER — Other Ambulatory Visit: Payer: Self-pay | Admitting: Physician Assistant

## 2013-11-18 ENCOUNTER — Other Ambulatory Visit: Payer: Self-pay | Admitting: Family Medicine

## 2013-11-18 DIAGNOSIS — F411 Generalized anxiety disorder: Secondary | ICD-10-CM

## 2013-12-01 ENCOUNTER — Other Ambulatory Visit: Payer: Self-pay | Admitting: Family Medicine

## 2013-12-01 ENCOUNTER — Other Ambulatory Visit: Payer: Self-pay | Admitting: Physician Assistant

## 2013-12-05 ENCOUNTER — Other Ambulatory Visit: Payer: Self-pay | Admitting: Physician Assistant

## 2013-12-08 ENCOUNTER — Telehealth: Payer: Self-pay

## 2013-12-08 NOTE — Telephone Encounter (Signed)
The patient stated that [facility] told her that they have not received her prescription for Celexa.  She said the pharmacy tried to call [facility] several times to request a refill of the patient's Celexa.  The patient's chart shows that the prescription was sent to [facility] on 7/22, but the patient said that [facility] told her that they did not have it.  The patient is leaving on a trip for a week, and she is concerned about the side effects of being off her medication for 1-2 weeks.

## 2013-12-09 MED ORDER — CITALOPRAM HYDROBROMIDE 20 MG PO TABS: ORAL_TABLET | ORAL | Status: DC

## 2013-12-09 NOTE — Telephone Encounter (Signed)
rx resent to pharmacy.  Lmom that rx was sent

## 2013-12-28 ENCOUNTER — Telehealth: Payer: Self-pay | Admitting: *Deleted

## 2013-12-28 NOTE — Telephone Encounter (Signed)
Called an left an voicemail regarding a follow-up of 29month diabetes check.

## 2013-12-31 ENCOUNTER — Other Ambulatory Visit: Payer: Self-pay | Admitting: Family Medicine

## 2013-12-31 DIAGNOSIS — G4700 Insomnia, unspecified: Secondary | ICD-10-CM

## 2014-01-24 ENCOUNTER — Ambulatory Visit (INDEPENDENT_AMBULATORY_CARE_PROVIDER_SITE_OTHER): Payer: 59 | Admitting: Family Medicine

## 2014-01-24 ENCOUNTER — Encounter: Payer: Self-pay | Admitting: Family Medicine

## 2014-01-24 VITALS — BP 132/100 | HR 84 | Temp 98.4°F | Resp 16 | Ht 62.0 in | Wt 166.4 lb

## 2014-01-24 DIAGNOSIS — R5383 Other fatigue: Secondary | ICD-10-CM

## 2014-01-24 DIAGNOSIS — M25561 Pain in right knee: Secondary | ICD-10-CM

## 2014-01-24 DIAGNOSIS — M25569 Pain in unspecified knee: Secondary | ICD-10-CM

## 2014-01-24 DIAGNOSIS — G2581 Restless legs syndrome: Secondary | ICD-10-CM

## 2014-01-24 DIAGNOSIS — I10 Essential (primary) hypertension: Secondary | ICD-10-CM

## 2014-01-24 DIAGNOSIS — F411 Generalized anxiety disorder: Secondary | ICD-10-CM

## 2014-01-24 DIAGNOSIS — E119 Type 2 diabetes mellitus without complications: Secondary | ICD-10-CM

## 2014-01-24 DIAGNOSIS — M25562 Pain in left knee: Secondary | ICD-10-CM

## 2014-01-24 DIAGNOSIS — R5381 Other malaise: Secondary | ICD-10-CM

## 2014-01-24 DIAGNOSIS — E785 Hyperlipidemia, unspecified: Secondary | ICD-10-CM

## 2014-01-24 DIAGNOSIS — Z23 Encounter for immunization: Secondary | ICD-10-CM

## 2014-01-24 LAB — COMPREHENSIVE METABOLIC PANEL
ALT: 23 U/L (ref 0–35)
AST: 14 U/L (ref 0–37)
Albumin: 4.5 g/dL (ref 3.5–5.2)
Alkaline Phosphatase: 88 U/L (ref 39–117)
BUN: 35 mg/dL — ABNORMAL HIGH (ref 6–23)
CHLORIDE: 98 meq/L (ref 96–112)
CO2: 27 meq/L (ref 19–32)
Calcium: 9.7 mg/dL (ref 8.4–10.5)
Creat: 1.06 mg/dL (ref 0.50–1.10)
Glucose, Bld: 113 mg/dL — ABNORMAL HIGH (ref 70–99)
Potassium: 4.2 mEq/L (ref 3.5–5.3)
SODIUM: 135 meq/L (ref 135–145)
TOTAL PROTEIN: 7.2 g/dL (ref 6.0–8.3)
Total Bilirubin: 0.5 mg/dL (ref 0.2–1.2)

## 2014-01-24 LAB — HEMOGLOBIN A1C
HEMOGLOBIN A1C: 6.9 % — AB (ref <5.7)
MEAN PLASMA GLUCOSE: 151 mg/dL — AB (ref <117)

## 2014-01-24 LAB — CBC
HCT: 40.1 % (ref 36.0–46.0)
HEMOGLOBIN: 13.5 g/dL (ref 12.0–15.0)
MCH: 28.7 pg (ref 26.0–34.0)
MCHC: 33.7 g/dL (ref 30.0–36.0)
MCV: 85.3 fL (ref 78.0–100.0)
Platelets: 287 10*3/uL (ref 150–400)
RBC: 4.7 MIL/uL (ref 3.87–5.11)
RDW: 13.9 % (ref 11.5–15.5)
WBC: 8.4 10*3/uL (ref 4.0–10.5)

## 2014-01-24 LAB — LIPID PANEL
CHOLESTEROL: 300 mg/dL — AB (ref 0–200)
HDL: 59 mg/dL (ref >39)
LDL Cholesterol: 200 mg/dL — ABNORMAL HIGH (ref 0–99)
Total CHOL/HDL Ratio: 5.1 Ratio
Triglycerides: 207 mg/dL — ABNORMAL HIGH (ref <150)
VLDL: 41 mg/dL — AB (ref 0–40)

## 2014-01-24 MED ORDER — PRAMIPEXOLE DIHYDROCHLORIDE 0.125 MG PO TABS: 0.1250 mg | ORAL_TABLET | Freq: Every day | ORAL | Status: DC

## 2014-01-24 MED ORDER — CITALOPRAM HYDROBROMIDE 20 MG PO TABS: ORAL_TABLET | ORAL | Status: DC

## 2014-01-24 MED ORDER — MELOXICAM 7.5 MG PO TABS: 7.5000 mg | ORAL_TABLET | Freq: Every day | ORAL | Status: DC

## 2014-01-24 MED ORDER — BUPROPION HCL ER (XL) 300 MG PO TB24: ORAL_TABLET | ORAL | Status: DC

## 2014-01-24 MED ORDER — LISINOPRIL-HYDROCHLOROTHIAZIDE 10-12.5 MG PO TABS: 1.0000 | ORAL_TABLET | Freq: Every day | ORAL | Status: DC

## 2014-01-24 NOTE — Patient Instructions (Addendum)
Good to see you today!  I will be in touch with your labs asap.  Hopefully your A1c will show good control since you have lost weight.    As a diabetic, there are several things you can do to monitor your condition and maintain your health.  1. Check your feet daily for any skin breakdown 2. Exercise and keep track of your diet 3. Let us know before you run out of your medications 4. Get your annual flu shot, and ask if you need a pneumonia shot 5. Ask if you are up to date on your labs; you should have an A1c every 6 months, a urine protein test annually, and a cholesterol test annually.  Your doctor may decide to do labs more often if indicated 6. Take off your shoes and socks at each visit.  Be sure your doctor examines your feet.   7. Ask about your blood pressure.  Your goal is 130/ 80 or less 8. Get an annual eye exam.  Please ask your ophthalmologist to send [place] your report 9. Keep up with your dental cleanings and exams.    I am going to change your BP medication to lisinopril/ HCTZ.  This is because ace- inhibiter medications such as lisinopril have been shown to preserve kidney function in diabetics.  Please check your BP at the drug store a few times over the next month and call or email me with your readings.   As far as fatigue, I would make sure that you are taking care of yourself and managing your stress.  Exercise, sleep, healthy diet, alcohol avoidance, and meditation can all be helpful here.  We will also look for any lab abnormality, but fatigue is most often due to stress/ lifestyle issues instead of a medical problem.

## 2014-01-24 NOTE — Progress Notes (Addendum)
[facility] [address] [place] [address] [phone]  Date:  01/24/2014   Name:  Jodi Williams   DOB:  [birth year]   MRN:  [id number]  PCP:  Lamar Blinks, MD    Chief Complaint: Follow-up   History of Present Illness:  Jodi Williams is a 63 y.o. very pleasant female patient who presents with the following:  Last seen by myself in April for her GAD. She was also dx with DM in April.  She is doing well.  She has been working on losing weight, but it has been hard as her daughter is getting divorced.   She also has noted restless legs for about 40 years.  She borrowed some mirapex from her daughter that did seem to help.  She would like to have an rx of her own if possible She reports that her home BP generally looks good She is taking mobic per Dr. Theda Sers for her knees- would like some more of this today.   Would like to have a flu shot today  She is taking klonopin 2mg  at bedtime.  She is fasting today for labs.  She continues to take wellbutrin and celexa for her GAD/ depression as well Complaint of "feeling tired all the time" for some time   Lab Results  Component Value Date   HGBA1C 7.2* 08/23/2013   Wt Readings from Last 3 Encounters:  01/24/14 166 lb 6.4 oz (75.479 kg)  08/23/13 176 lb (79.833 kg)  08/03/13 177 lb 8 oz (80.513 kg)     Patient Active Problem List   Diagnosis Date Noted  . Type II or unspecified type diabetes mellitus without mention of complication, not stated as uncontrolled 08/24/2013  . Other and unspecified hyperlipidemia 09/07/2012  . HTN (hypertension) 04/12/2012  . OA (osteoarthritis) of knee 04/12/2012  . 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 Depression with anxiety 04/12/2012  . H/O gastric ulcer 04/12/2012  . ALLERGIC RHINITIS WITH CONJUNCTIVITIS 09/08/2007  . ASTHMA 07/20/2007    Past Medical History  Diagnosis Date  . Depression   . Anxiety   . Allergy   . Arthritis   . Cataract   . Ulcer   .  Hyperlipidemia   . Hypertension   . Asthma     Past Surgical History  Procedure Laterality Date  . Breast surgery    . Back surgery    . Hand surgery    . Eye surgery      cataract    History  Substance Use Topics  . Smoking status: Never Smoker   . Smokeless tobacco: Not on file  . Alcohol Use: No    Family History  Problem Relation Age of Onset  . Hyperlipidemia Mother   . Hypertension Mother   . Heart disease Mother   . Dementia Mother   . Arthritis Mother   . Neuropathy Father   . Diabetes Father   . Kidney disease Father     Allergies  Allergen Reactions  . Statins   . Omnicef [Cefdinir] Rash    Medication list has been reviewed and updated.  Current Outpatient Prescriptions on File Prior to Visit  Medication Sig Dispense Refill  . acetaminophen (TYLENOL) 650 MG CR tablet Take 650 mg by mouth every 8 (eight) hours as needed.      Marland Kitchen aspirin 81 MG tablet Take 81 mg by mouth daily.      Marland Kitchen buPROPion (WELLBUTRIN XL) 300 MG 24 hr tablet TAKE  1 TABLET BY MOUTH EVERY DAY  30 tablet  5  . citalopram (CELEXA) 20 MG tablet TAKE 2 TABLETS BY MOUTH EVERY DAY  60 tablet  2  . clonazePAM (KLONOPIN) 2 MG tablet TAKE 1/2-1 TABLET BY MOUTH AT BEDTIME AS NEEDED  30 tablet  1  . metFORMIN (GLUCOPHAGE) 500 MG tablet Take 1 tablet (500 mg total) by mouth 2 (two) times daily with a meal. Start 1 at bedtime for one week  180 tablet  3  . Omega-3 Fatty Acids (FISH OIL) 1000 MG CAPS Take 3 capsules by mouth daily.      Marland Kitchen OVER THE COUNTER MEDICATION OTC Ibuprofen 200 mg taking      . RABEprazole (ACIPHEX) 20 MG tablet Take 1 tablet (20 mg total) by mouth daily.  30 tablet  5  . Red Yeast Rice Extract (RED YEAST RICE PO) Take 1 tablet by mouth daily.      Marland Kitchen triamterene-hydrochlorothiazide (MAXZIDE-25) 37.5-25 MG per tablet TAKE 1 TABLET BY MOUTH EVERY DAY  30 tablet  2  . celecoxib (CELEBREX) 100 MG capsule Take 1 capsule (100 mg total) by mouth 2 (two) times daily. Use as needed for  knee pain  60 capsule  5   No current facility-administered medications on file prior to visit.    Review of Systems:  As per HPI- otherwise negative.   Physical Examination: Filed Vitals:   01/24/14 0916  BP: 132/100  Pulse: 84  Temp: 98.4 F (36.9 C)  Resp: 16   Filed Vitals:   01/24/14 0916  Height: 5\' 2"  (1.575 m)  Weight: 166 lb 6.4 oz (75.479 kg)   Body mass index is 30.43 kg/(m^2). Ideal Body Weight: Weight in (lb) to have BMI = 25: 136.4  GEN: WDWN, NAD, Non-toxic, A & O x 3, overweight, looks well HEENT: Atraumatic, Normocephalic. Neck supple. No masses, No LAD. Ears and Nose: No external deformity. CV: RRR, No M/G/R. No JVD. No thrill. No extra heart sounds. PULM: CTA B, no wheezes, crackles, rhonchi. No retractions. No resp. distress. No accessory muscle use. EXTR: No c/c/e NEURO Normal gait.  PSYCH: Normally interactive. Conversant. Not depressed or anxious appearing.  Calm demeanor.    Assessment and Plan: Type II or unspecified type diabetes mellitus without mention of complication, not stated as uncontrolled - Plan: Comprehensive metabolic panel, Hemoglobin A1c  Dyslipidemia - Plan: Lipid panel  Arthralgia of both knees - Plan: meloxicam (MOBIC) 7.5 MG tablet  Generalized anxiety disorder - Plan: buPROPion (WELLBUTRIN XL) 300 MG 24 hr tablet, citalopram (CELEXA) 20 MG tablet  RLS (restless legs syndrome) - Plan: pramipexole (MIRAPEX) 0.125 MG tablet  Essential hypertension - Plan: lisinopril-hydrochlorothiazide (PRINZIDE,ZESTORETIC) 10-12.5 MG per tablet  Need for prophylactic vaccination and inoculation against influenza - Plan: Flu Vaccine QUAD 36+ mos IM  Other malaise and fatigue - Plan: CBC  Changed to lisinopril/hctz as she now has DM and needs renal protection She may try mirapex for RLS  See patient instructions for more details.     Signed Lamar Blinks, MD  Called 9/15: her cholesterol is worse.  She had tried statins some time  ago and had some aches but no liver issues.  She would be willing to try this again,  Will rx pravachol 20 and she will take it once a day- it trouble she can decrease to QOD or every 3rd day.  DC if this still causes issues.  Recheck 3 months- lab only

## 2014-01-25 ENCOUNTER — Encounter: Payer: Self-pay | Admitting: Family Medicine

## 2014-01-25 MED ORDER — PRAVASTATIN SODIUM 20 MG PO TABS
20.0000 mg | ORAL_TABLET | Freq: Every day | ORAL | Status: DC
Start: 2014-01-25 — End: 2015-10-30

## 2014-01-25 NOTE — Addendum Note (Signed)
Addended by: Lamar Blinks C on: 01/25/2014 04:19 PM   Modules accepted: Orders

## 2014-01-25 NOTE — Addendum Note (Signed)
Addended by: Lamar Blinks C on: 01/25/2014 04:21 PM   Modules accepted: Orders

## 2014-03-07 ENCOUNTER — Other Ambulatory Visit: Payer: Self-pay | Admitting: Family Medicine

## 2014-03-07 DIAGNOSIS — G4700 Insomnia, unspecified: Secondary | ICD-10-CM

## 2014-04-25 ENCOUNTER — Ambulatory Visit: Payer: 59 | Admitting: Family Medicine

## 2014-05-07 ENCOUNTER — Other Ambulatory Visit: Payer: Self-pay | Admitting: Family Medicine

## 2014-05-09 ENCOUNTER — Other Ambulatory Visit: Payer: Self-pay | Admitting: Family Medicine

## 2014-05-16 ENCOUNTER — Encounter: Payer: Self-pay | Admitting: Family Medicine

## 2014-05-16 ENCOUNTER — Ambulatory Visit (INDEPENDENT_AMBULATORY_CARE_PROVIDER_SITE_OTHER): Payer: 59 | Admitting: Family Medicine

## 2014-05-16 VITALS — BP 124/75 | HR 71 | Temp 97.8°F | Resp 16 | Ht 62.5 in | Wt 168.0 lb

## 2014-05-16 DIAGNOSIS — R5383 Other fatigue: Secondary | ICD-10-CM

## 2014-05-16 DIAGNOSIS — E785 Hyperlipidemia, unspecified: Secondary | ICD-10-CM

## 2014-05-16 DIAGNOSIS — L989 Disorder of the skin and subcutaneous tissue, unspecified: Secondary | ICD-10-CM

## 2014-05-16 DIAGNOSIS — E119 Type 2 diabetes mellitus without complications: Secondary | ICD-10-CM

## 2014-05-16 DIAGNOSIS — Z23 Encounter for immunization: Secondary | ICD-10-CM

## 2014-05-16 LAB — BASIC METABOLIC PANEL
BUN: 13 mg/dL (ref 6–23)
CALCIUM: 9.7 mg/dL (ref 8.4–10.5)
CO2: 27 meq/L (ref 19–32)
CREATININE: 0.87 mg/dL (ref 0.50–1.10)
Chloride: 102 mEq/L (ref 96–112)
Glucose, Bld: 132 mg/dL — ABNORMAL HIGH (ref 70–99)
Potassium: 4.3 mEq/L (ref 3.5–5.3)
Sodium: 137 mEq/L (ref 135–145)

## 2014-05-16 LAB — LIPID PANEL
CHOLESTEROL: 208 mg/dL — AB (ref 0–200)
HDL: 53 mg/dL (ref >39)
LDL Cholesterol: 125 mg/dL — ABNORMAL HIGH (ref 0–99)
TRIGLYCERIDES: 150 mg/dL — AB (ref <150)
Total CHOL/HDL Ratio: 3.9 Ratio
VLDL: 30 mg/dL (ref 0–40)

## 2014-05-16 LAB — POCT GLYCOSYLATED HEMOGLOBIN (HGB A1C): Hemoglobin A1C: 6.3

## 2014-05-16 NOTE — Progress Notes (Signed)
Subjective:    Patient ID: Jodi Williams, female    DOB: 08/21/1950, 64 y.o.   MRN: [id number]  PCP: Lamar Blinks, MD   Wt Readings from Last 3 Encounters:  05/16/14 168 lb (76.204 kg)  01/24/14 166 lb 6.4 oz (75.479 kg)  08/23/13 176 lb (79.833 kg)     Chief Complaint  Patient presents with  . Follow-up  . Diabetes   Patient Active Problem List   Diagnosis Date Noted  . Type II or unspecified type diabetes mellitus without mention of complication, not stated as uncontrolled 08/24/2013  . Other and unspecified hyperlipidemia 09/07/2012  . HTN (hypertension) 04/12/2012  . OA (osteoarthritis) of knee 04/12/2012  . 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 Depression with anxiety 04/12/2012  . H/O gastric ulcer 04/12/2012  . ALLERGIC RHINITIS WITH CONJUNCTIVITIS 09/08/2007  . ASTHMA 07/20/2007   Prior to Admission medications   Medication Sig Start Date End Date Taking? Authorizing Provider  acetaminophen (TYLENOL) 650 MG CR tablet Take 650 mg by mouth every 8 (eight) hours as needed.   Yes Historical Provider, MD  aspirin 81 MG tablet Take 81 mg by mouth daily.   Yes Historical Provider, MD  buPROPion (WELLBUTRIN XL) 300 MG 24 hr tablet TAKE 1 TABLET BY MOUTH EVERY DAY 01/24/14  Yes Gay Filler Copland, MD  citalopram (CELEXA) 20 MG tablet TAKE 2 TABLETS BY MOUTH EVERY DAY 01/24/14  Yes Jessica C Copland, MD  clonazePAM (KLONOPIN) 2 MG tablet TAKE 1/2 TO 1 TABLET BY MOUTH EVERY NIGHT AT BEDTIME AS NEEDED FOR SLEEP 05/09/14  Yes Jessica C Copland, MD  lisinopril-hydrochlorothiazide (PRINZIDE,ZESTORETIC) 10-12.5 MG per tablet Take 1 tablet by mouth daily. 01/24/14  Yes Gay Filler Copland, MD  meloxicam (MOBIC) 7.5 MG tablet Take 1 tablet (7.5 mg total) by mouth daily. 01/24/14  Yes Gay Filler Copland, MD  metFORMIN (GLUCOPHAGE) 500 MG tablet Take 1 tablet (500 mg total) by mouth 2 (two) times daily with a meal. Start 1 at bedtime for one week 08/24/13  Yes Gay Filler Copland, MD  pramipexole  (MIRAPEX) 0.125 MG tablet Take 1 tablet (0.125 mg total) by mouth daily. 01/24/14  Yes Gay Filler Copland, MD  pravastatin (PRAVACHOL) 20 MG tablet Take 1 tablet (20 mg total) by mouth daily. May take every 2nd or 3rd day if aches 01/25/14  Yes Gay Filler Copland, MD  RABEprazole (ACIPHEX) 20 MG tablet Take 1 tablet (20 mg total) by mouth daily. 04/12/12  Yes Mancel Bale, PA-C  Omega-3 Fatty Acids (FISH OIL) 1000 MG CAPS Take 3 capsules by mouth daily.    Historical Provider, MD  OVER THE COUNTER MEDICATION OTC Ibuprofen 200 mg taking    Historical Provider, MD  Red Yeast Rice Extract (RED YEAST RICE PO) Take 1 tablet by mouth daily.    Historical Provider, MD   Medications, allergies, past medical history, surgical history, family history, social history and problem list reviewed and updated.  HPI  64 yof with pmh dm2, htn, high cholesterol, and gad presents for dm2 f/u.  She states she has not been doing well with diet/exercise since last visit. She ate poorly over the holidays. She does not drink sugary drinks but otherwise doesn't watch her diet. Has glucometer at home but not checking BG. Taking her metformin 500 mg bid. Last A1C was 6.9 3 months ago, down from 7.2   She is not exercising. She has OA in both knees and had arthroscopic surgery right knee on 04/22/15 for  meniscus issue. Has knee brace on now and is recovering well. She plans to f/u soon with Dr Stacie [facility] for steroid injxs. She was not exercising prior to the knee surgery either.   She mentions fatigue during the day and trouble staying awake during the day. She works full time. Both of her parents passed in the past year and this has been a stressor on her. She also takes care of her granddaughter on school days from 3pm - 6pm. She is very busy with family obligations as well. Has trouble falling asleep at night. Uses xanax at night which helps.    LDL was drawn at last appt 9/15, elevated at 200. Had hx muscle  aches with statins previously so was not on statin. Was started on pravastatin 20 mg qhs in 9/15. Has been tolerating this well.   BP well controlled today. She was changed from triamterene/hctz to lisinopril/hctz 3 months ago for renal protection. Not checking at home.   Mentions a spot on her skin over abdomen.   Review of Systems No CP, SOB, fever, chills.     Objective:   Physical Exam  Constitutional: She is oriented to person, place, and time. She appears well-developed and well-nourished.  Non-toxic appearance. She does not have a sickly appearance. She does not appear ill. No distress.  BP 124/75 mmHg  Pulse 71  Temp(Src) 97.8 F (36.6 C)  Resp 16  Ht 5' 2.5" (1.588 m)  Wt 168 lb (76.204 kg)  BMI 30.22 kg/m2  SpO2 97%   Cardiovascular: Normal rate, regular rhythm and normal heart sounds.  Exam reveals no gallop.   No murmur heard. Pulses:      Dorsalis pedis pulses are 2+ on the right side, and 2+ on the left side.  Pulmonary/Chest: Effort normal and breath sounds normal. She has no decreased breath sounds. She has no wheezes. She has no rhonchi. She has no rales.  Neurological: She is alert and oriented to person, place, and time.  Skin:     78mm consistently dark red lesion with regular borders on lower abdomen.   Psychiatric: She has a normal mood and affect. Her speech is normal.   Results for orders placed or performed in visit on 05/16/14  POCT glycosylated hemoglobin (Hb A1C)  Result Value Ref Range   Hemoglobin A1C 6.3       Assessment & Plan:   64 yof with pmh dm2, htn, high cholesterol, and gad presents for dm2 f/u.  Dyslipidemia - Plan: Lipid panel --Tolerating pravastatin well --lipids drawn today  Type 2 diabetes mellitus without complication - Plan: Basic metabolic panel, POCT glycosylated hemoglobin (Hb A1C), Microalbumin, urine --Weight down 8# last 9 months --A1C down 6.9 --> 6.3 --continue 500 metformin bid --diet/exericse  encouraged --urine ma today, on lisinopril/hctz --rtc 6 months, encouraged checking home bg 2x/week --monofilament testing normal --bmp today as Scr increased slightly last visit  Need for prophylactic vaccination against Streptococcus pneumoniae (pneumococcus) - Plan: Pneumococcal polysaccharide vaccine 23-valent greater than or equal to 2yo subcutaneous/IM --pneumovax today  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

## 2014-05-16 NOTE — Patient Instructions (Addendum)
Try to take it easy when you can. It's important to make sure you're giving yourself enough downtime every day to relax and recover. Your fatigue is probably due to overstretching yourself, the holidays, and your recent knee surgery along with the loss of your parents. If youre still feel fatigued over the next few months please return to clinic and we can work this up further. Try to watch your sugar intake. Limiting sweets, treats, and refined carbs will help to control you sugar. It would be helpful to know where your sugar is running if you check your sugar at home a couple times per week.  Your A1C was improved today.  We gave you a pneumonia vaccine today. We would like to see you back in 6 months for follow up. I'll let you know the results of the labs we drew today.

## 2014-06-09 ENCOUNTER — Other Ambulatory Visit: Payer: Self-pay | Admitting: Family Medicine

## 2014-06-09 DIAGNOSIS — G4700 Insomnia, unspecified: Secondary | ICD-10-CM

## 2014-06-10 ENCOUNTER — Other Ambulatory Visit: Payer: Self-pay | Admitting: Family Medicine

## 2014-08-07 ENCOUNTER — Ambulatory Visit (INDEPENDENT_AMBULATORY_CARE_PROVIDER_SITE_OTHER): Payer: 59

## 2014-08-07 ENCOUNTER — Ambulatory Visit (INDEPENDENT_AMBULATORY_CARE_PROVIDER_SITE_OTHER): Payer: 59 | Admitting: Family Medicine

## 2014-08-07 VITALS — BP 140/84 | HR 95 | Temp 99.1°F | Resp 24 | Ht 61.5 in | Wt 176.4 lb

## 2014-08-07 DIAGNOSIS — R05 Cough: Secondary | ICD-10-CM

## 2014-08-07 DIAGNOSIS — S93402A Sprain of unspecified ligament of left ankle, initial encounter: Secondary | ICD-10-CM

## 2014-08-07 DIAGNOSIS — R509 Fever, unspecified: Secondary | ICD-10-CM

## 2014-08-07 DIAGNOSIS — R059 Cough, unspecified: Secondary | ICD-10-CM

## 2014-08-07 LAB — POCT INFLUENZA A/B
Influenza A, POC: NEGATIVE
Influenza B, POC: NEGATIVE

## 2014-08-07 MED ORDER — METHYLPREDNISOLONE ACETATE 80 MG/ML IJ SUSP
80.0000 mg | Freq: Once | INTRAMUSCULAR | Status: AC
  Administered 2014-08-07: 80 mg via INTRAMUSCULAR

## 2014-08-07 MED ORDER — AZITHROMYCIN 250 MG PO TABS: ORAL_TABLET | ORAL | Status: DC

## 2014-08-07 MED ORDER — HYDROCOD POLST-CHLORPHEN POLST 10-8 MG/5ML PO LQCR: 5.0000 mL | Freq: Two times a day (BID) | ORAL | Status: DC | PRN

## 2014-08-07 NOTE — Progress Notes (Signed)
° °  Subjective:    Patient ID: Jodi Williams, female    DOB: Aug 06, 1950, 64 y.o.   MRN: [id number] This chart was scribed for Jodi Haber, MD by Marti Sleigh, Medical Scribe. This patient was seen in Room 3 and the patient's care was started a 1:44 PM.  Chief Complaint  Patient presents with   Cough   Shortness of Breath   Ankle Injury    [place]   Headache    HPI HPI Comments: Jodi Williams is a 64 y.o. female with a hx of HTN, asthma, DM, and osteoarthritis who presents to [facility] complaining of cough and SOB with associated fever and HA for the last three days. Pt states she has a past hx of asthma and pneumonia. Pt does not use an inhaler. Pt states she had one vomiting event two days ago.   Pt is also complaining of ankle pain after twisting her ankle last night.   Pt received a flu shot this year.    Review of Systems  Constitutional: Positive for fever and chills.  HENT: Positive for congestion and rhinorrhea. Negative for ear pain.   Eyes: Negative for pain and redness.  Respiratory: Positive for cough and shortness of breath.   Gastrointestinal: Positive for vomiting.  Musculoskeletal:       Ankle injury  Neurological: Positive for headaches.       Objective:   Physical Exam  Constitutional: She is oriented to person, place, and time. She appears well-developed and well-nourished.  Pt appears acutely ill, is tremulous and needs assistance moving from recumbent to sitting position.  HENT:  Head: Normocephalic and atraumatic.  False teeth. TM's normal.  Eyes: Pupils are equal, round, and reactive to light.  Neck: Neck supple.  Cardiovascular: Normal rate.   Pulmonary/Chest: She has wheezes.  Bilateral wheezing.  Musculoskeletal: Normal range of motion.  TTP over lateral malleolus.  Neurological: She is alert and oriented to person, place, and time. Coordination normal.  Skin: Skin is warm.  Psychiatric: She has a normal mood and  affect.  Nursing note and vitals reviewed.  [facility] reading (PRIMARY) by  Dr. Joseph Art: Chest x-ray is normal, left ankle is normal.  Results for orders placed or performed in visit on 08/07/14  POCT Influenza A/B  Result Value Ref Range   Influenza A, POC Negative    Influenza B, POC Negative        Assessment & Plan:   This chart was scribed in my presence and reviewed by me personally.    ICD-9-CM ICD-10-CM   1. Cough 786.2 R05 POCT Influenza A/B     DG Chest 2 View     DG Chest 2 View     methylPREDNISolone acetate (DEPO-MEDROL) injection 80 mg     chlorpheniramine-HYDROcodone (TUSSIONEX PENNKINETIC ER) 10-8 MG/5ML LQCR  2. Fever, unspecified fever cause 780.60 R50.9 POCT Influenza A/B     DG Chest 2 View     DG Chest 2 View     azithromycin (ZITHROMAX Z-PAK) 250 MG tablet  3. Sprain of ankle, left, initial encounter 845.00 S93.402A DG Ankle Complete Left     DG Ankle Complete Left     chlorpheniramine-HYDROcodone (TUSSIONEX PENNKINETIC ER) 10-8 MG/5ML LQCR   Left ankle stabilizer x 7 days  Signed, Jodi Haber, MD

## 2014-08-07 NOTE — Patient Instructions (Addendum)
Use the ankle brace for the next 7 days when you open walking. Of work for the next 2 days. Let us know if symptoms persist   Acute Bronchitis Bronchitis is inflammation of the airways that extend from the windpipe into the lungs (bronchi). The inflammation often causes mucus to develop. This leads to a cough, which is the most common symptom of bronchitis.  In acute bronchitis, the condition usually develops suddenly and goes away over time, usually in a couple weeks. Smoking, allergies, and asthma can make bronchitis worse. Repeated episodes of bronchitis may cause further lung problems.  CAUSES Acute bronchitis is most often caused by the same virus that causes a cold. The virus can spread from person to person (contagious) through coughing, sneezing, and touching contaminated objects. SIGNS AND SYMPTOMS   Cough.   Fever.   Coughing up mucus.   Body aches.   Chest congestion.   Chills.   Shortness of breath.   Sore throat.  DIAGNOSIS  Acute bronchitis is usually diagnosed through a physical exam. Your health care provider will also ask you questions about your medical history. Tests, such as chest X-rays, are sometimes done to rule out other conditions.  TREATMENT  Acute bronchitis usually goes away in a couple weeks. Oftentimes, no medical treatment is necessary. Medicines are sometimes given for relief of fever or cough. Antibiotic medicines are usually not needed but may be prescribed in certain situations. In some cases, an inhaler may be recommended to help reduce shortness of breath and control the cough. A cool mist vaporizer may also be used to help thin bronchial secretions and make it easier to clear the chest.  HOME CARE INSTRUCTIONS  Get plenty of rest.   Drink enough fluids to keep your urine clear or pale yellow (unless you have a medical condition that requires fluid restriction). Increasing fluids may help thin your respiratory secretions (sputum) and  reduce chest congestion, and it will prevent dehydration.   Take medicines only as directed by your health care provider.  If you were prescribed an antibiotic medicine, finish it all even if you start to feel better.  Avoid smoking and secondhand smoke. Exposure to cigarette smoke or irritating chemicals will make bronchitis worse. If you are a smoker, consider using nicotine gum or skin patches to help control withdrawal symptoms. Quitting smoking will help your lungs heal faster.   Reduce the chances of another bout of acute bronchitis by washing your hands frequently, avoiding people with cold symptoms, and trying not to touch your hands to your mouth, nose, or eyes.   Keep all follow-up visits as directed by your health care provider.  SEEK MEDICAL CARE IF: Your symptoms do not improve after 1 week of treatment.  SEEK IMMEDIATE MEDICAL CARE IF:  You develop an increased fever or chills.   You have chest pain.   You have severe shortness of breath.  You have bloody sputum.   You develop dehydration.  You faint or repeatedly feel like you are going to pass out.  You develop repeated vomiting.  You develop a severe headache. MAKE SURE YOU:   Understand these instructions.  Will watch your condition.  Will get help right away if you are not doing well or get worse. Document Released: 06/06/2004 Document Revised: 09/13/2013 Document Reviewed: 10/20/2012 [facility] Patient Information 2015 [place], [place]. This information is not intended to replace advice given to you by your health care provider. Make sure you discuss any questions you have with  your health care provider. Ankle Sprain An ankle sprain is an injury to the strong, fibrous tissues (ligaments) that hold the bones of your ankle joint together.  CAUSES An ankle sprain is usually caused by a fall or by twisting your ankle. Ankle sprains most commonly occur when you step on the outer edge of your foot, and your  ankle turns inward. People who participate in sports are more prone to these types of injuries.  SYMPTOMS   Pain in your ankle. The pain may be present at rest or only when you are trying to stand or walk.  Swelling.  Bruising. Bruising may develop immediately or within 1 to 2 days after your injury.  Difficulty standing or walking, particularly when turning corners or changing directions. DIAGNOSIS  Your caregiver will ask you details about your injury and perform a physical exam of your ankle to determine if you have an ankle sprain. During the physical exam, your caregiver will press on and apply pressure to specific areas of your foot and ankle. Your caregiver will try to move your ankle in certain ways. An X-ray exam may be done to be sure a bone was not broken or a ligament did not separate from one of the bones in your ankle (avulsion fracture).  TREATMENT  Certain types of braces can help stabilize your ankle. Your caregiver can make a recommendation for this. Your caregiver may recommend the use of medicine for pain. If your sprain is severe, your caregiver may refer you to a surgeon who helps to restore function to parts of your skeletal system (orthopedist) or a physical therapist. [place] ice to your injury for 1-2 days or as directed by your caregiver. Applying ice helps to reduce inflammation and pain.  Put ice in a plastic bag.  Place a towel between your skin and the bag.  Leave the ice on for 15-20 minutes at a time, every 2 hours while you are awake.  Only take over-the-counter or prescription medicines for pain, discomfort, or fever as directed by your caregiver.  Elevate your injured ankle above the level of your heart as much as possible for 2-3 days.  If your caregiver recommends crutches, use them as instructed. Gradually put weight on the affected ankle. Continue to use crutches or a cane until you can walk without feeling pain in your  ankle.  If you have a plaster splint, wear the splint as directed by your caregiver. Do not rest it on anything harder than a pillow for the first 24 hours. Do not put weight on it. Do not get it wet. You may take it off to take a shower or bath.  You may have been given an elastic bandage to wear around your ankle to provide support. If the elastic bandage is too tight (you have numbness or tingling in your foot or your foot becomes cold and blue), adjust the bandage to make it comfortable.  If you have an air splint, you may blow more air into it or let air out to make it more comfortable. You may take your splint off at night and before taking a shower or bath. Wiggle your toes in the splint several times per day to decrease swelling. SEEK MEDICAL CARE IF:   You have rapidly increasing bruising or swelling.  Your toes feel extremely cold or you lose feeling in your foot.  Your pain is not relieved with medicine. SEEK IMMEDIATE MEDICAL CARE IF:  Your  toes are numb or blue.  You have severe pain that is increasing. MAKE SURE YOU:   Understand these instructions.  Will watch your condition.  Will get help right away if you are not doing well or get worse. Document Released: 04/29/2005 Document Revised: 01/22/2012 Document Reviewed: 05/11/2011 [facility] Patient Information 2015 [place], [place]. This information is not intended to replace advice given to you by your health care provider. Make sure you discuss any questions you have with your health care provider.

## 2014-08-11 ENCOUNTER — Telehealth: Payer: Self-pay | Admitting: Family Medicine

## 2014-08-11 DIAGNOSIS — R509 Fever, unspecified: Secondary | ICD-10-CM

## 2014-08-11 MED ORDER — AZITHROMYCIN 250 MG PO TABS: ORAL_TABLET | ORAL | Status: DC

## 2014-08-11 NOTE — Telephone Encounter (Signed)
Patient wants another z-pack. States she is feeling somewhat better but does not want to RTC just to get a z-pack.  [phone]

## 2014-08-11 NOTE — Telephone Encounter (Signed)
RTC

## 2014-08-11 NOTE — Telephone Encounter (Signed)
I have refilled the Z pak

## 2014-08-11 NOTE — Telephone Encounter (Signed)
Pt.notified

## 2014-08-12 NOTE — Telephone Encounter (Signed)
Patient has finished the Z-Pack and is requesting more medication to be called into the [facility] on [address] and [address]  [phone]

## 2014-08-13 ENCOUNTER — Telehealth: Payer: Self-pay

## 2014-08-13 ENCOUNTER — Other Ambulatory Visit: Payer: Self-pay | Admitting: Family Medicine

## 2014-08-13 DIAGNOSIS — J0100 Acute maxillary sinusitis, unspecified: Secondary | ICD-10-CM

## 2014-08-13 MED ORDER — AMOXICILLIN 875 MG PO TABS: 875.0000 mg | ORAL_TABLET | Freq: Two times a day (BID) | ORAL | Status: DC

## 2014-08-13 NOTE — Telephone Encounter (Signed)
Pt not better. Still running intermittent fevers. Can't cough anything up. Please advise.

## 2014-08-13 NOTE — Telephone Encounter (Signed)
Pt is still having symptoms feels the z-pac is not helping

## 2014-08-14 ENCOUNTER — Ambulatory Visit (INDEPENDENT_AMBULATORY_CARE_PROVIDER_SITE_OTHER): Payer: 59 | Admitting: Emergency Medicine

## 2014-08-14 VITALS — BP 132/80 | HR 88 | Temp 99.7°F | Resp 18 | Ht 61.5 in | Wt 171.8 lb

## 2014-08-14 DIAGNOSIS — J40 Bronchitis, not specified as acute or chronic: Secondary | ICD-10-CM

## 2014-08-14 DIAGNOSIS — J209 Acute bronchitis, unspecified: Secondary | ICD-10-CM

## 2014-08-14 DIAGNOSIS — J0140 Acute pansinusitis, unspecified: Secondary | ICD-10-CM | POA: Diagnosis not present

## 2014-08-14 MED ORDER — ALBUTEROL SULFATE HFA 108 (90 BASE) MCG/ACT IN AERS: 2.0000 | INHALATION_SPRAY | RESPIRATORY_TRACT | Status: DC | PRN

## 2014-08-14 MED ORDER — AMOXICILLIN-POT CLAVULANATE 875-125 MG PO TABS: 1.0000 | ORAL_TABLET | Freq: Two times a day (BID) | ORAL | Status: DC

## 2014-08-14 MED ORDER — HYDROCOD POLST-CHLORPHEN POLST 10-8 MG/5ML PO LQCR: 5.0000 mL | Freq: Two times a day (BID) | ORAL | Status: DC | PRN

## 2014-08-14 MED ORDER — PSEUDOEPHEDRINE-GUAIFENESIN ER 60-600 MG PO TB12: 1.0000 | ORAL_TABLET | Freq: Two times a day (BID) | ORAL | Status: DC

## 2014-08-14 NOTE — Patient Instructions (Signed)
Metered Dose Inhaler (No Spacer Used)  Inhaled medicines are the basis of treatment for asthma and other breathing problems. Inhaled medicine can only be effective if used properly. Good technique assures that the medicine reaches the lungs.  Metered dose inhalers (MDIs) are used to deliver a variety of inhaled medicines. These include quick relief or rescue medicines (such as bronchodilators) and controller medicines (such as corticosteroids). The medicine is delivered by pushing down on a metal canister to release a set amount of spray.  If you are using different kinds of inhalers, use your quick relief medicine to open the airways 10-15 minutes before using a steroid, if instructed to do so by your health care provider. If you are unsure which inhalers to use and the order of using them, ask your health care provider, nurse, or respiratory therapist.  HOW TO USE THE INHALER  1. Remove the cap from the inhaler.  2. If you are using the inhaler for the first time, you will need to prime it. Shake the inhaler for 5 seconds and release four puffs into the air, away from your face. Ask your health care provider or pharmacist if you have questions about priming your inhaler.  3. Shake the inhaler for 5 seconds before each breath in (inhalation).  4. Position the inhaler so that the top of the canister faces up.  5. Put your index finger on the top of the medicine canister. Your thumb supports the bottom of the inhaler.  6. Open your mouth.  7. Either place the inhaler between your teeth and place your lips tightly around the mouthpiece, or hold the inhaler 1-2 inches away from your open mouth. If you are unsure of which technique to use, ask your health care provider.  8. Breathe out (exhale) normally and as completely as possible.  9. Press the canister down with the index finger to release the medicine.  10. At the same time as the canister is pressed, inhale deeply and slowly until your lungs are completely filled.  This should take 4-6 seconds. Keep your tongue down.  11. Hold the medicine in your lungs for 5-10 seconds (10 seconds is best). This helps the medicine get into the small airways of your lungs.  12. Breathe out slowly, through pursed lips. Whistling is an example of pursed lips.  13. Wait at least 1 minute between puffs. Continue with the above steps until you have taken the number of puffs your health care provider has ordered. Do not use the inhaler more than your health care provider directs you to.  14. Replace the cap on the inhaler.  15. Follow the directions from your health care provider or the inhaler insert for cleaning the inhaler.  If you are using a steroid inhaler, after your last puff, rinse your mouth with water, gargle, and spit out the water. Do not swallow the water.  AVOID:  · Inhaling before or after starting the spray of medicine. It takes practice to coordinate your breathing with triggering the spray.  · Inhaling through the nose (rather than the mouth) when triggering the spray.  HOW TO DETERMINE IF YOUR INHALER IS FULL OR NEARLY EMPTY  You cannot know when an inhaler is empty by shaking it. Some inhalers are now being made with dose counters. Ask your health care provider for a prescription that has a dose counter if you feel you need that extra help. If your inhaler does not have a counter, ask your health care   provider to help you determine the date you need to refill your inhaler. Write the refill date on a calendar or your inhaler canister. Refill your inhaler 7-10 days before it runs out. Be sure to keep an adequate supply of medicine. This includes making sure it has not expired, and making sure you have a spare inhaler.  SEEK MEDICAL CARE IF:  · Symptoms are only partially relieved with your inhaler.  · You are having trouble using your inhaler.  · You experience an increase in phlegm.  SEEK IMMEDIATE MEDICAL CARE IF:  · You feel little or no relief with your inhalers. You are still  wheezing and feeling shortness of breath, tightness in your chest, or both.  · You have dizziness, headaches, or a fast heart rate.  · You have chills, fever, or night sweats.  · There is a noticeable increase in phlegm production, or there is blood in the phlegm.  MAKE SURE YOU:  · Understand these instructions.  · Will watch your condition.  · Will get help right away if you are not doing well or get worse.  Document Released: 02/24/2007 Document Revised: 09/13/2013 Document Reviewed: 10/15/2012  [employer]® Patient Information ©2015 [employer]. This information is not intended to replace advice given to you by your health care provider. Make sure you discuss any questions you have with your health care provider.

## 2014-08-14 NOTE — Progress Notes (Signed)
[facility] [address] [place] [address] [phone]  Date:  08/14/2014   Name:  Jodi Williams   DOB:  1950/06/01   MRN:  [id number]  PCP:  Lamar Blinks, MD    Chief Complaint: Follow-up   History of Present Illness:  Jodi Williams is a 64 y.o. very pleasant female patient who presents with the following:  Seen a week ago with bronchitis Treated symptomatically and has not improved.  Had two rounds of zithromax Now has wheezing and cough productive purulent sputum. Weak and fatigued Has fever no chills No nausea or chills Has nasal congestion and purulent nasal drainage Headache  No sore throat. No improvement with over the counter medications or other home remedies.  Denies other complaint or health concern today.    Patient Active Problem List   Diagnosis Date Noted  . Type II or unspecified type diabetes mellitus without mention of complication, not stated as uncontrolled 08/24/2013  . Other and unspecified hyperlipidemia 09/07/2012  . HTN (hypertension) 04/12/2012  . OA (osteoarthritis) of knee 04/12/2012  . 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 Depression with anxiety 04/12/2012  . H/O gastric ulcer 04/12/2012  . ALLERGIC RHINITIS WITH CONJUNCTIVITIS 09/08/2007  . ASTHMA 07/20/2007    Past Medical History  Diagnosis Date  . Depression   . Anxiety   . Allergy   . Arthritis   . Cataract   . Ulcer   . Hyperlipidemia   . Hypertension   . Asthma     Past Surgical History  Procedure Laterality Date  . Breast surgery    . Back surgery    . Hand surgery    . Eye surgery      cataract  . Knee surgery      History  Substance Use Topics  . Smoking status: Never Smoker   . Smokeless tobacco: Never Used  . Alcohol Use: No    Family History  Problem Relation Age of Onset  . Hyperlipidemia Mother   . Hypertension Mother   . Heart disease Mother   . Dementia Mother   . Arthritis Mother   . Neuropathy Father   . Diabetes  Father   . Kidney disease Father     Allergies  Allergen Reactions  . Omnicef [Cefdinir] Rash    Medication list has been reviewed and updated.  Current Outpatient Prescriptions on File Prior to Visit  Medication Sig Dispense Refill  . acetaminophen (TYLENOL) 650 MG CR tablet Take 650 mg by mouth every 8 (eight) hours as needed.    Marland Kitchen amoxicillin (AMOXIL) 875 MG tablet Take 1 tablet (875 mg total) by mouth 2 (two) times daily. 20 tablet 0  . aspirin 81 MG tablet Take 81 mg by mouth daily.    Marland Kitchen azithromycin (ZITHROMAX Z-PAK) 250 MG tablet Take as directed on pack 6 tablet 0  . buPROPion (WELLBUTRIN XL) 300 MG 24 hr tablet TAKE 1 TABLET BY MOUTH EVERY DAY 30 tablet 9  . chlorpheniramine-HYDROcodone (TUSSIONEX PENNKINETIC ER) 10-8 MG/5ML LQCR Take 5 mLs by mouth every 12 (twelve) hours as needed. 115 mL 0  . citalopram (CELEXA) 20 MG tablet TAKE 2 TABLETS BY MOUTH EVERY DAY 60 tablet 9  . clonazePAM (KLONOPIN) 2 MG tablet TAKE 1/2 TO 1 TABLET BY MOUTH EVERY NIGHT AT BEDTIME AS NEEDED FOR SLEEP 30 tablet 1  . lisinopril-hydrochlorothiazide (PRINZIDE,ZESTORETIC) 10-12.5 MG per tablet Take 1 tablet by mouth daily. 30 tablet 9  . meloxicam (MOBIC)  7.5 MG tablet Take 1 tablet (7.5 mg total) by mouth daily. 30 tablet 9  . metFORMIN (GLUCOPHAGE) 500 MG tablet Take 1 tablet (500 mg total) by mouth 2 (two) times daily with a meal. Start 1 at bedtime for one week 180 tablet 3  . Omega-3 Fatty Acids (FISH OIL) 1000 MG CAPS Take 3 capsules by mouth daily.    Marland Kitchen OVER THE COUNTER MEDICATION OTC Ibuprofen 200 mg taking    . pramipexole (MIRAPEX) 0.125 MG tablet Take 1 tablet (0.125 mg total) by mouth daily. 30 tablet 9  . pravastatin (PRAVACHOL) 20 MG tablet Take 1 tablet (20 mg total) by mouth daily. May take every 2nd or 3rd day if aches 90 tablet 3  . RABEprazole (ACIPHEX) 20 MG tablet Take 1 tablet (20 mg total) by mouth daily. 30 tablet 5  . Red Yeast Rice Extract (RED YEAST RICE PO) Take 1 tablet by  mouth daily.     No current facility-administered medications on file prior to visit.    Review of Systems:  As per HPI, otherwise negative.    Physical Examination: Filed Vitals:   08/14/14 0911  BP: 132/80  Pulse: 88  Temp: 99.7 F (37.6 C)  Resp: 18   Filed Vitals:   08/14/14 0911  Height: 5' 1.5" (1.562 m)  Weight: 171 lb 12.8 oz (77.928 kg)   Body mass index is 31.94 kg/(m^2). Ideal Body Weight: Weight in (lb) to have BMI = 25: 134.2  GEN: WDWN, NAD, Non-toxic, A & O x 3 HEENT: Atraumatic, Normocephalic. Neck supple. No masses, No LAD. Ears and Nose: No external deformity. CV: RRR, No M/G/R. No JVD. No thrill. No extra heart sounds. PULM: CTA B, no wheezes, crackles, rhonchi. No retractions. No resp. distress. No accessory muscle use. ABD: S, NT, ND, +BS. No rebound. No HSM. EXTR: No c/c/e NEURO Normal gait.  PSYCH: Normally interactive. Conversant. Not depressed or anxious appearing.  Calm demeanor.    Assessment and Plan: Bronchitis with bronchospasm Sinusitis Albuterol augmentin mucinex d tussionex  Signed,  Ellison Carwin, MD

## 2014-08-19 ENCOUNTER — Other Ambulatory Visit: Payer: Self-pay | Admitting: Family Medicine

## 2014-08-23 ENCOUNTER — Ambulatory Visit (INDEPENDENT_AMBULATORY_CARE_PROVIDER_SITE_OTHER): Payer: 59 | Admitting: Family Medicine

## 2014-08-23 ENCOUNTER — Ambulatory Visit (INDEPENDENT_AMBULATORY_CARE_PROVIDER_SITE_OTHER): Payer: 59

## 2014-08-23 VITALS — BP 114/70 | HR 88 | Temp 98.0°F | Resp 20 | Ht 62.0 in | Wt 171.5 lb

## 2014-08-23 DIAGNOSIS — J189 Pneumonia, unspecified organism: Secondary | ICD-10-CM | POA: Diagnosis not present

## 2014-08-23 DIAGNOSIS — H6501 Acute serous otitis media, right ear: Secondary | ICD-10-CM

## 2014-08-23 DIAGNOSIS — R059 Cough, unspecified: Secondary | ICD-10-CM

## 2014-08-23 DIAGNOSIS — J0110 Acute frontal sinusitis, unspecified: Secondary | ICD-10-CM | POA: Diagnosis not present

## 2014-08-23 DIAGNOSIS — K219 Gastro-esophageal reflux disease without esophagitis: Secondary | ICD-10-CM

## 2014-08-23 DIAGNOSIS — R05 Cough: Secondary | ICD-10-CM

## 2014-08-23 DIAGNOSIS — R42 Dizziness and giddiness: Secondary | ICD-10-CM

## 2014-08-23 DIAGNOSIS — J309 Allergic rhinitis, unspecified: Secondary | ICD-10-CM | POA: Diagnosis not present

## 2014-08-23 DIAGNOSIS — R197 Diarrhea, unspecified: Secondary | ICD-10-CM

## 2014-08-23 DIAGNOSIS — J387 Other diseases of larynx: Secondary | ICD-10-CM | POA: Diagnosis not present

## 2014-08-23 LAB — POCT CBC
Granulocyte percent: 81.9 %G — AB (ref 37–80)
HCT, POC: 40.3 % (ref 37.7–47.9)
Hemoglobin: 12.8 g/dL (ref 12.2–16.2)
LYMPH, POC: 2.1 (ref 0.6–3.4)
MCH: 28 pg (ref 27–31.2)
MCHC: 31.9 g/dL (ref 31.8–35.4)
MCV: 87.7 fL (ref 80–97)
MID (CBC): 0.6 (ref 0–0.9)
MPV: 6.4 fL (ref 0–99.8)
POC Granulocyte: 12.4 — AB (ref 2–6.9)
POC LYMPH PERCENT: 13.9 %L (ref 10–50)
POC MID %: 4.2 % (ref 0–12)
Platelet Count, POC: 383 10*3/uL (ref 142–424)
RBC: 4.59 M/uL (ref 4.04–5.48)
RDW, POC: 14.9 %
WBC: 15.1 10*3/uL — AB (ref 4.6–10.2)

## 2014-08-23 LAB — GLUCOSE, POCT (MANUAL RESULT ENTRY): POC Glucose: 125 mg/dl — AB (ref 70–99)

## 2014-08-23 MED ORDER — LEVOFLOXACIN 500 MG PO TABS: 500.0000 mg | ORAL_TABLET | Freq: Every day | ORAL | Status: DC

## 2014-08-23 NOTE — Progress Notes (Addendum)
Subjective:    Patient ID: Jodi Williams, female    DOB: Sep 24, 1950, 64 y.o.   MRN: [id number] This chart was scribed for Merri Ray, MD by Zola Button, Medical Scribe. This patient was seen in Room 3 and the patient's care was started at 11:08 AM.   HPI HPI Comments: Jodi Williams is a 64 y.o. female who presents to the [facility] for a follow-up  Patient has hx of asthma, allergic rhinitis, and type II DM. Her A1C was 6.3 this past January. She was seen by Dr. Ouida Sills 9 days ago, diagnosed with bronchitis, bronchospasm and sinusitis, treated with Augmentin, Mucinex, Tussionex, and albuterol. Prior to that, she was seen by Dr. Joseph Art on 08/07/14. She had a negative flu test and negative CXR. She was treated with Z-pack, Tussionex, and 80 mg Depo-Medrol injection.   She has used her albuterol inhaler about 3 times last week. Patient does note improvement 2-3 days ago, but then her persistent dry cough progressively worsened yesterday. The cough does interrupt her sleep. She also reports having intermittent problems with balance and weakness that worsened today. She has concern for pneumonia. Patient denies fever. She has had her flu shot and pneumonia vaccine. For her allergies, she has been taking Allegra, but no nasal sprays. She uses albuterol prn for asthma; she has not had any recent flare-ups or hospitalizations for asthma. Patient also has problems with acid reflux, for which she takes OTC omeprazole and Tums.  Patient had a left ankle sprain about 2 weeks ago.  Has had some diarrhea past few weeks multiple episodes per day, sometimes small amounts. Noticed since travel to [place].  This occurred prior to start of antibiotics and unchanged since starting antibiotics. Had been drinking some diet drinks at the time as well. No vomiting. No abd pain. No rectal bleeding.   Patient Active Problem List   Diagnosis Date Noted  . Type II or unspecified type diabetes  mellitus without mention of complication, not stated as uncontrolled 08/24/2013  . Other and unspecified hyperlipidemia 09/07/2012  . HTN (hypertension) 04/12/2012  . OA (osteoarthritis) of knee 04/12/2012  . 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 Depression with anxiety 04/12/2012  . H/O gastric ulcer 04/12/2012  . ALLERGIC RHINITIS WITH CONJUNCTIVITIS 09/08/2007  . ASTHMA 07/20/2007   Past Medical History  Diagnosis Date  . Depression   . Anxiety   . Allergy   . Arthritis   . Cataract   . Ulcer   . Hyperlipidemia   . Hypertension   . Asthma    Past Surgical History  Procedure Laterality Date  . Breast surgery    . Back surgery    . Hand surgery    . Eye surgery      cataract  . Knee surgery     Allergies  Allergen Reactions  . Omnicef [Cefdinir] Rash   Prior to Admission medications   Medication Sig Start Date End Date Taking? Authorizing Provider  acetaminophen (TYLENOL) 650 MG CR tablet Take 650 mg by mouth every 8 (eight) hours as needed.   Yes Historical Provider, MD  albuterol (PROVENTIL HFA;VENTOLIN HFA) 108 (90 BASE) MCG/ACT inhaler Inhale 2 puffs into the lungs every 4 (four) hours as needed for wheezing or shortness of breath (cough, shortness of breath or wheezing.). 08/14/14  Yes Roselee Culver, MD  aspirin 81 MG tablet Take 81 mg by mouth daily.   Yes Historical Provider, MD  buPROPion (WELLBUTRIN XL) 300  MG 24 hr tablet TAKE 1 TABLET BY MOUTH EVERY DAY 01/24/14  Yes Gay Filler Copland, MD  citalopram (CELEXA) 20 MG tablet TAKE 2 TABLETS BY MOUTH EVERY DAY 01/24/14  Yes Jessica C Copland, MD  clonazePAM (KLONOPIN) 2 MG tablet TAKE 1/2 TO 1 TABLET BY MOUTH EVERY NIGHT AT BEDTIME AS NEEDED FOR SLEEP 08/19/14  Yes Jessica C Copland, MD  lisinopril-hydrochlorothiazide (PRINZIDE,ZESTORETIC) 10-12.5 MG per tablet Take 1 tablet by mouth daily. 01/24/14  Yes Gay Filler Copland, MD  meloxicam (MOBIC) 7.5 MG tablet Take 1 tablet (7.5 mg total) by mouth daily. 01/24/14  Yes  Gay Filler Copland, MD  metFORMIN (GLUCOPHAGE) 500 MG tablet Take 1 tablet (500 mg total) by mouth 2 (two) times daily with a meal. Start 1 at bedtime for one week 08/24/13  Yes Gay Filler Copland, MD  Omega-3 Fatty Acids (FISH OIL) 1000 MG CAPS Take 3 capsules by mouth daily.   Yes Historical Provider, MD  OVER THE COUNTER MEDICATION OTC Ibuprofen 200 mg taking   Yes Historical Provider, MD  pramipexole (MIRAPEX) 0.125 MG tablet Take 1 tablet (0.125 mg total) by mouth daily. 01/24/14  Yes Gay Filler Copland, MD  pravastatin (PRAVACHOL) 20 MG tablet Take 1 tablet (20 mg total) by mouth daily. May take every 2nd or 3rd day if aches 01/25/14  Yes Gay Filler Copland, MD  RABEprazole (ACIPHEX) 20 MG tablet Take 1 tablet (20 mg total) by mouth daily. 04/12/12  Yes Mancel Bale, PA-C  Red Yeast Rice Extract (RED YEAST RICE PO) Take 1 tablet by mouth daily.   Yes Historical Provider, MD   History   Social History  . Marital Status: Married    Spouse Name: N/A  . Number of Children: N/A  . Years of Education: N/A   Occupational History  . House cleaner    Social History Main Topics  . Smoking status: Never Smoker   . Smokeless tobacco: Never Used  . Alcohol Use: No  . Drug Use: No  . Sexual Activity: Yes    Birth Control/ Protection: None   Other Topics Concern  . Not on file   Social History Narrative     Review of Systems  Constitutional: Negative for fever.  Respiratory: Positive for cough.   Musculoskeletal: Positive for myalgias and arthralgias.  Neurological: Positive for weakness.       Objective:   Physical Exam  Constitutional: She is oriented to person, place, and time. She appears well-developed and well-nourished. No distress.  HENT:  Head: Normocephalic and atraumatic.  Right Ear: Hearing, external ear and ear canal normal.  Left Ear: Hearing, tympanic membrane, external ear and ear canal normal.  Nose: Right sinus exhibits frontal sinus tenderness. Right sinus exhibits  no maxillary sinus tenderness. Left sinus exhibits frontal sinus tenderness. Left sinus exhibits no maxillary sinus tenderness.  Mouth/Throat: Oropharynx is clear and moist. No oropharyngeal exudate.  Minimal clear fluid behind right TM.  Eyes: Conjunctivae and EOM are normal. Pupils are equal, round, and reactive to light.  Cardiovascular: Normal rate, regular rhythm, normal heart sounds and intact distal pulses.   No murmur heard. Pulmonary/Chest: Effort normal and breath sounds normal. No respiratory distress. She has no wheezes. She has no rhonchi.  Abdominal: Bowel sounds are normal. She exhibits no distension. There is no tenderness. There is no rebound.  Neurological: She is alert and oriented to person, place, and time. She has normal strength. Coordination and gait normal. GCS eye subscore is 4. GCS  verbal subscore is 5. GCS motor subscore is 6.  Skin: Skin is warm and dry. No rash noted.  Psychiatric: She has a normal mood and affect. Her behavior is normal.  Vitals reviewed.     Filed Vitals:   08/23/14 1054  BP: 114/70  Pulse: 88  Temp: 98 F (36.7 C)  TempSrc: Oral  Resp: 20  Height: 5\' 2"  (1.575 m)  Weight: 171 lb 8 oz (77.792 kg)  SpO2: 97%    Results for orders placed or performed in visit on 08/23/14  POCT glucose (manual entry)  Result Value Ref Range   POC Glucose 125 (A) 70 - 99 mg/dl  POCT CBC  Result Value Ref Range   WBC 15.1 (A) 4.6 - 10.2 K/uL   Lymph, poc 2.1 0.6 - 3.4   POC LYMPH PERCENT 13.9 10 - 50 %L   MID (cbc) 0.6 0 - 0.9   POC MID % 4.2 0 - 12 %M   POC Granulocyte 12.4 (A) 2 - 6.9   Granulocyte percent 81.9 (A) 37 - 80 %G   RBC 4.59 4.04 - 5.48 M/uL   Hemoglobin 12.8 12.2 - 16.2 g/dL   HCT, POC 40.3 37.7 - 47.9 %   MCV 87.7 80 - 97 fL   MCH, POC 28.0 27 - 31.2 pg   MCHC 31.9 31.8 - 35.4 g/dL   RDW, POC 14.9 %   Platelet Count, POC 383 142 - 424 K/uL   MPV 6.4 0 - 99.8 fL   [facility] reading (PRIMARY) by  Dr. Carlota Raspberry:  CXR: few increased  RLL markings.        Assessment & Plan:   Jodi Williams is a 63 y.o. female Cough, CAP (community acquired pneumonia), Acute frontal sinusitis, recurrence not specified - Plan: levofloxacin (LEVAQUIN) 500 MG tablet.   -possible multifactorial cough.  Persistent with now worsening cough on augmentin - possible atypical bacteria with early pneumonia or bronchitis. Also with signs of frontal sinusits and this with allergies may be causing PND cough, and LPR with underlying GERD.   - stop augmentin, start levaquin (rsik of QT prolongation with levaquin or axithro discussed, can try decreased dose of CElexa while on Levaquin, but if serotonin w/d sx - may need to return to prior dose.  RTC precauions discussed for any palpitations or new/worsened dizziness).   -cont allegra, start flonase.   -Avoid triggers for GERD.   Allergic rhinitis, unspecified allergic rhinitis type  -as above.   Laryngopharyngeal reflux (LPR)  -as above  Dizziness, Right acute serous otitis media, recurrence not specified  -suspected serous otitis cause of dizziness vs allergies. Reassuring exam, nonfocal neuro exam. flonase and levaquin as above. saline ns otc.  Rtc/er precautions discussed.   Diarrhea - Plan: Stool culture, Clostridium Difficile by PCR  - noted prior to abx's.  Check C diff testing and cautioned about occurrence of C diff with recent antibiotics.  rtc precautions if any worsening of diarrhea, abd pain or fever.   Meds ordered this encounter  Medications  . omeprazole (PRILOSEC) 20 MG capsule    Sig: Take 20 mg by mouth daily.  Marland Kitchen levofloxacin (LEVAQUIN) 500 MG tablet    Sig: Take 1 tablet (500 mg total) by mouth daily.    Dispense:  10 tablet    Refill:  0   Patient Instructions  Your cough may be a combination of allergies, heartburn causing cough, asthma/wheezing and possible early pneumonia.  Start flonase using techniue we discussed  for allergies - continue the Allegra each day.  Continue  prilosec, but avoid foods as listed below that can make heartburn worse.  Albuterol if needed for wheezing or "tight cough".  Stop augmentin, start levaquin once per day for 10 days. This medicine would also cover a sinus infection if you have this as well. THere is a risk of heart rhythm abnormality with this and your citalopram as we discussed.  Can try decreasing your citalopram to one per day while taking levaquin, but if any new lightheadedness, heart palpitations or chest pains - go to emergency room or return here right away.   The balance symptoms are likely due to congestion and pressure behind the ears.  Flonase may help this. Saline nasal spray atleast 4 times per day for congestion.  If any worsening of diarrhea - return right away.   Recheck in next 2-3 days.  Return to the clinic or go to the nearest emergency room if any of your symptoms worsen or new symptoms occur.  Cough, Adult  A cough is a reflex that helps clear your throat and airways. It can help heal the body or may be a reaction to an irritated airway. A cough may only last 2 or 3 weeks (acute) or may last more than 8 weeks (chronic).  CAUSES Acute cough:  Viral or bacterial infections. Chronic cough:  Infections.  Allergies.  Asthma.  Post-nasal drip.  Smoking.  Heartburn or acid reflux.  Some medicines.  Chronic lung problems (COPD).  Cancer. SYMPTOMS   Cough.  Fever.  Chest pain.  Increased breathing rate.  High-pitched whistling sound when breathing (wheezing).  Colored mucus that you cough up (sputum). TREATMENT   A bacterial cough may be treated with antibiotic medicine.  A viral cough must run its course and will not respond to antibiotics.  Your caregiver may recommend other treatments if you have a chronic cough. HOME CARE INSTRUCTIONS   Only take over-the-counter or prescription medicines for pain, discomfort, or fever as directed by your caregiver. Use cough suppressants  only as directed by your caregiver.  Use a cold steam vaporizer or humidifier in your bedroom or home to help loosen secretions.  Sleep in a semi-upright position if your cough is worse at night.  Rest as needed.  Stop smoking if you smoke. SEEK IMMEDIATE MEDICAL CARE IF:   You have pus in your sputum.  Your cough starts to worsen.  You cannot control your cough with suppressants and are losing sleep.  You begin coughing up blood.  You have difficulty breathing.  You develop pain which is getting worse or is uncontrolled with medicine.  You have a fever. MAKE SURE YOU:   Understand these instructions.  Will watch your condition.  Will get help right away if you are not doing well or get worse. Document Released: 10/26/2010 Document Revised: 07/22/2011 Document Reviewed: 10/26/2010 [facility] Patient Information 2015 [place], [place]. This information is not intended to replace advice given to you by your health care provider. Make sure you discuss any questions you have with your health care provider.  Food Choices for Gastroesophageal Reflux Disease When you have gastroesophageal reflux disease (GERD), the foods you eat and your eating habits are very important. Choosing the right foods can help ease the discomfort of GERD. WHAT GENERAL GUIDELINES DO I NEED TO FOLLOW?  Choose fruits, vegetables, whole grains, low-fat dairy products, and low-fat meat, fish, and poultry.  Limit fats such as oils, salad dressings, butter, nuts,  and avocado.  Keep a food diary to identify foods that cause symptoms.  Avoid foods that cause reflux. These may be different for different people.  Eat frequent small meals instead of three large meals each day.  Eat your meals slowly, in a relaxed setting.  Limit fried foods.  Cook foods using methods other than frying.  Avoid drinking alcohol.  Avoid drinking large amounts of liquids with your meals.  Avoid bending over or lying down  until 2-3 hours after eating. WHAT FOODS ARE NOT RECOMMENDED? The following are some foods and drinks that may worsen your symptoms: Vegetables Tomatoes. Tomato juice. Tomato and spaghetti sauce. Chili peppers. Onion and garlic. Horseradish. Fruits Oranges, grapefruit, and lemon (fruit and juice). Meats High-fat meats, fish, and poultry. This includes hot dogs, ribs, ham, sausage, salami, and bacon. Dairy Whole milk and chocolate milk. Sour cream. Cream. Butter. Ice cream. Cream cheese.  Beverages Coffee and tea, with or without caffeine. Carbonated beverages or energy drinks. Condiments Hot sauce. Barbecue sauce.  Sweets/Desserts Chocolate and cocoa. Donuts. Peppermint and spearmint. Fats and Oils High-fat foods, including [place] fries and potato chips. Other Vinegar. Strong spices, such as black pepper, white pepper, red pepper, cayenne, curry powder, cloves, ginger, and chili powder. The items listed above may not be a complete list of foods and beverages to avoid. Contact your dietitian for more information. Document Released: 04/29/2005 Document Revised: 05/04/2013 Document Reviewed: 03/03/2013 [facility] Patient Information 2015 [place], [place]. This information is not intended to replace advice given to you by your health care provider. Make sure you discuss any questions you have with your health care provider.  Serous Otitis Media Serous otitis media is fluid in the middle ear space. This space contains the bones for hearing and air. Air in the middle ear space helps to transmit sound.  The air gets there through the eustachian tube. This tube goes from the back of the nose (nasopharynx) to the middle ear space. It keeps the pressure in the middle ear the same as the outside world. It also helps to drain fluid from the middle ear space. CAUSES  Serous otitis media occurs when the eustachian tube gets blocked. Blockage can come from:  Ear infections.  Colds and other upper  respiratory infections.  Allergies.  Irritants such as cigarette smoke.  Sudden changes in air pressure (such as descending in an airplane).  Enlarged adenoids.  A mass in the nasopharynx. During colds and upper respiratory infections, the middle ear space can become temporarily filled with fluid. This can happen after an ear infection also. Once the infection clears, the fluid will generally drain out of the ear through the eustachian tube. If it does not, then serous otitis media occurs. SIGNS AND SYMPTOMS   Hearing loss.  A feeling of fullness in the ear, without pain.  Young children may not show any symptoms but may show slight behavioral changes, such as agitation, ear pulling, or crying. DIAGNOSIS  Serous otitis media is diagnosed by an ear exam. Tests may be done to check on the movement of the eardrum. Hearing exams may also be done. TREATMENT  The fluid most often goes away without treatment. If allergy is the cause, allergy treatment may be helpful. Fluid that persists for several months may require minor surgery. A small tube is placed in the eardrum to:  Drain the fluid.  Restore the air in the middle ear space. In certain situations, antibiotic medicines are used to avoid surgery. Surgery may be  done to remove enlarged adenoids (if this is the cause). HOME CARE INSTRUCTIONS   Keep children away from tobacco smoke.  Keep all follow-up visits as directed by your health care provider. SEEK MEDICAL CARE IF:   Your hearing is not better in 3 months.  Your hearing is worse.  You have ear pain.  You have drainage from the ear.  You have dizziness.  You have serous otitis media only in one ear or have any bleeding from your nose (epistaxis).  You notice a lump on your neck. MAKE SURE YOU:  Understand these instructions.   Will watch your condition.   Will get help right away if you are not doing well or get worse.  Document Released: 07/20/2003 Document  Revised: 09/13/2013 Document Reviewed: 11/24/2012 [facility] Patient Information 2015 [place], [place]. This information is not intended to replace advice given to you by your health care provider. Make sure you discuss any questions you have with your health care provider.  Pneumonia Pneumonia is an infection of the lungs.  CAUSES Pneumonia may be caused by bacteria or a virus. Usually, these infections are caused by breathing infectious particles into the lungs (respiratory tract). SIGNS AND SYMPTOMS   Cough.  Fever.  Chest pain.  Increased rate of breathing.  Wheezing.  Mucus production. DIAGNOSIS  If you have the common symptoms of pneumonia, your health care provider will typically confirm the diagnosis with a chest X-ray. The X-ray will show an abnormality in the lung (pulmonary infiltrate) if you have pneumonia. Other tests of your blood, urine, or sputum may be done to find the specific cause of your pneumonia. Your health care provider may also do tests (blood gases or pulse oximetry) to see how well your lungs are working. TREATMENT  Some forms of pneumonia may be spread to other people when you cough or sneeze. You may be asked to wear a mask before and during your exam. Pneumonia that is caused by bacteria is treated with antibiotic medicine. Pneumonia that is caused by the influenza virus may be treated with an antiviral medicine. Most other viral infections must run their course. These infections will not respond to antibiotics.  HOME CARE INSTRUCTIONS   Cough suppressants may be used if you are losing too much rest. However, coughing protects you by clearing your lungs. You should avoid using cough suppressants if you can.  Your health care provider may have prescribed medicine if he or she thinks your pneumonia is caused by bacteria or influenza. Finish your medicine even if you start to feel better.  Your health care provider may also prescribe an expectorant. This loosens  the mucus to be coughed up.  Take medicines only as directed by your health care provider.  Do not smoke. Smoking is a common cause of bronchitis and can contribute to pneumonia. If you are a smoker and continue to smoke, your cough may last several weeks after your pneumonia has cleared.  A cold steam vaporizer or humidifier in your room or home may help loosen mucus.  Coughing is often worse at night. Sleeping in a semi-upright position in a recliner or using a couple pillows under your head will help with this.  Get rest as you feel it is needed. Your body will usually let you know when you need to rest. PREVENTION A pneumococcal shot (vaccine) is available to prevent a common bacterial cause of pneumonia. This is usually suggested for:  People over 60 years old.  Patients on chemotherapy.  People with chronic lung problems, such as bronchitis or emphysema.  People with immune system problems. If you are over 65 or have a high risk condition, you may receive the pneumococcal vaccine if you have not received it before. In some countries, a routine influenza vaccine is also recommended. This vaccine can help prevent some cases of pneumonia.You may be offered the influenza vaccine as part of your care. If you smoke, it is time to quit. You may receive instructions on how to stop smoking. Your health care provider can provide medicines and counseling to help you quit. SEEK MEDICAL CARE IF: You have a fever. SEEK IMMEDIATE MEDICAL CARE IF:   Your illness becomes worse. This is especially true if you are elderly or weakened from any other disease.  You cannot control your cough with suppressants and are losing sleep.  You begin coughing up blood.  You develop pain which is getting worse or is uncontrolled with medicines.  Any of the symptoms which initially brought you in for treatment are getting worse rather than better.  You develop shortness of breath or chest  pain.  Diarrhea Diarrhea is frequent loose and watery bowel movements. It can cause you to feel weak and dehydrated. Dehydration can cause you to become tired and thirsty, have a dry mouth, and have decreased urination that often is dark yellow. Diarrhea is a sign of another problem, most often an infection that will not last long. In most cases, diarrhea typically lasts 2-3 days. However, it can last longer if it is a sign of something more serious. It is important to treat your diarrhea as directed by your caregiver to lessen or prevent future episodes of diarrhea. CAUSES  Some common causes include:  Gastrointestinal infections caused by viruses, bacteria, or parasites.  Food poisoning or food allergies.  Certain medicines, such as antibiotics, chemotherapy, and laxatives.  Artificial sweeteners and fructose.  Digestive disorders. HOME CARE INSTRUCTIONS  Ensure adequate fluid intake (hydration): Have 1 cup (8 oz) of fluid for each diarrhea episode. Avoid fluids that contain simple sugars or sports drinks, fruit juices, whole milk products, and sodas. Your urine should be clear or pale yellow if you are drinking enough fluids. Hydrate with an oral rehydration solution that you can purchase at pharmacies, retail stores, and online. You can prepare an oral rehydration solution at home by mixing the following ingredients together:   - tsp table salt.   tsp baking soda.   tsp salt substitute containing potassium chloride.  1  tablespoons sugar.  1 L (34 oz) of water.  Certain foods and beverages may increase the speed at which food moves through the gastrointestinal (GI) tract. These foods and beverages should be avoided and include:  Caffeinated and alcoholic beverages.  High-fiber foods, such as raw fruits and vegetables, nuts, seeds, and whole grain breads and cereals.  Foods and beverages sweetened with sugar alcohols, such as xylitol, sorbitol, and mannitol.  Some foods may  be well tolerated and may help thicken stool including:  Starchy foods, such as rice, toast, pasta, low-sugar cereal, oatmeal, grits, baked potatoes, crackers, and bagels.  Bananas.  Applesauce.  Add probiotic-rich foods to help increase healthy bacteria in the GI tract, such as yogurt and fermented milk products.  Wash your hands well after each diarrhea episode.  Only take over-the-counter or prescription medicines as directed by your caregiver.  Take a warm bath to relieve any burning or pain from frequent diarrhea episodes. SEEK IMMEDIATE MEDICAL CARE IF:  You are unable to keep fluids down.  You have persistent vomiting.  You have blood in your stool, or your stools are black and tarry.  You do not urinate in 6-8 hours, or there is only a small amount of very dark urine.  You have abdominal pain that increases or localizes.  You have weakness, dizziness, confusion, or light-headedness.  You have a severe headache.  Your diarrhea gets worse or does not get better.  You have a fever or persistent symptoms for more than 2-3 days.  You have a fever and your symptoms suddenly get worse. MAKE SURE YOU:   Understand these instructions.  Will watch your condition.  Will get help right away if you are not doing well or get worse. Document Released: 04/19/2002 Document Revised: 09/13/2013 Document Reviewed: 01/05/2012 [facility] Patient Information 2015 [place], [place]. This information is not intended to replace advice given to you by your health care provider. Make sure you discuss any questions you have with your health care provider.  MAKE SURE YOU:   Understand these instructions.  Will watch your condition.  Will get help right away if you are not doing well or get worse. Document Released: 04/29/2005 Document Revised: 09/13/2013 Document Reviewed: 07/19/2010 [facility] Patient Information 2015 [place], [place]. This information is not intended to replace advice  given to you by your health care provider. Make sure you discuss any questions you have with your health care provider.      I personally performed the services described in this documentation, which was scribed in my presence. The recorded information has been reviewed and considered, and addended by me as needed.

## 2014-08-23 NOTE — Patient Instructions (Addendum)
Your cough may be a combination of allergies, heartburn causing cough, asthma/wheezing and possible early pneumonia.  Start flonase using techniue we discussed for allergies - continue the Allegra each day.  Continue prilosec, but avoid foods as listed below that can make heartburn worse.  Albuterol if needed for wheezing or "tight cough".  Stop augmentin, start levaquin once per day for 10 days. This medicine would also cover a sinus infection if you have this as well. THere is a risk of heart rhythm abnormality with this and your citalopram as we discussed.  Can try decreasing your citalopram to one per day while taking levaquin, but if any new lightheadedness, heart palpitations or chest pains - go to emergency room or return here right away.   The balance symptoms are likely due to congestion and pressure behind the ears.  Flonase may help this. Saline nasal spray atleast 4 times per day for congestion.  If any worsening of diarrhea - return right away.   Recheck in next 2-3 days.  Return to the clinic or go to the nearest emergency room if any of your symptoms worsen or new symptoms occur.  Cough, Adult  A cough is a reflex that helps clear your throat and airways. It can help heal the body or may be a reaction to an irritated airway. A cough may only last 2 or 3 weeks (acute) or may last more than 8 weeks (chronic).  CAUSES Acute cough:  Viral or bacterial infections. Chronic cough:  Infections.  Allergies.  Asthma.  Post-nasal drip.  Smoking.  Heartburn or acid reflux.  Some medicines.  Chronic lung problems (COPD).  Cancer. SYMPTOMS   Cough.  Fever.  Chest pain.  Increased breathing rate.  High-pitched whistling sound when breathing (wheezing).  Colored mucus that you cough up (sputum). TREATMENT   A bacterial cough may be treated with antibiotic medicine.  A viral cough must run its course and will not respond to antibiotics.  Your caregiver may  recommend other treatments if you have a chronic cough. HOME CARE INSTRUCTIONS   Only take over-the-counter or prescription medicines for pain, discomfort, or fever as directed by your caregiver. Use cough suppressants only as directed by your caregiver.  Use a cold steam vaporizer or humidifier in your bedroom or home to help loosen secretions.  Sleep in a semi-upright position if your cough is worse at night.  Rest as needed.  Stop smoking if you smoke. SEEK IMMEDIATE MEDICAL CARE IF:   You have pus in your sputum.  Your cough starts to worsen.  You cannot control your cough with suppressants and are losing sleep.  You begin coughing up blood.  You have difficulty breathing.  You develop pain which is getting worse or is uncontrolled with medicine.  You have a fever. MAKE SURE YOU:   Understand these instructions.  Will watch your condition.  Will get help right away if you are not doing well or get worse. Document Released: 10/26/2010 Document Revised: 07/22/2011 Document Reviewed: 10/26/2010 [facility] Patient Information 2015 [place], [place]. This information is not intended to replace advice given to you by your health care provider. Make sure you discuss any questions you have with your health care provider.  Food Choices for Gastroesophageal Reflux Disease When you have gastroesophageal reflux disease (GERD), the foods you eat and your eating habits are very important. Choosing the right foods can help ease the discomfort of GERD. WHAT GENERAL GUIDELINES DO I NEED TO FOLLOW?  Choose fruits,  vegetables, whole grains, low-fat dairy products, and low-fat meat, fish, and poultry.  Limit fats such as oils, salad dressings, butter, nuts, and avocado.  Keep a food diary to identify foods that cause symptoms.  Avoid foods that cause reflux. These may be different for different people.  Eat frequent small meals instead of three large meals each day.  Eat your meals  slowly, in a relaxed setting.  Limit fried foods.  Cook foods using methods other than frying.  Avoid drinking alcohol.  Avoid drinking large amounts of liquids with your meals.  Avoid bending over or lying down until 2-3 hours after eating. WHAT FOODS ARE NOT RECOMMENDED? The following are some foods and drinks that may worsen your symptoms: Vegetables Tomatoes. Tomato juice. Tomato and spaghetti sauce. Chili peppers. Onion and garlic. Horseradish. Fruits Oranges, grapefruit, and lemon (fruit and juice). Meats High-fat meats, fish, and poultry. This includes hot dogs, ribs, ham, sausage, salami, and bacon. Dairy Whole milk and chocolate milk. Sour cream. Cream. Butter. Ice cream. Cream cheese.  Beverages Coffee and tea, with or without caffeine. Carbonated beverages or energy drinks. Condiments Hot sauce. Barbecue sauce.  Sweets/Desserts Chocolate and cocoa. Donuts. Peppermint and spearmint. Fats and Oils High-fat foods, including [place] fries and potato chips. Other Vinegar. Strong spices, such as black pepper, white pepper, red pepper, cayenne, curry powder, cloves, ginger, and chili powder. The items listed above may not be a complete list of foods and beverages to avoid. Contact your dietitian for more information. Document Released: 04/29/2005 Document Revised: 05/04/2013 Document Reviewed: 03/03/2013 [facility] Patient Information 2015 [place], [place]. This information is not intended to replace advice given to you by your health care provider. Make sure you discuss any questions you have with your health care provider.  Serous Otitis Media Serous otitis media is fluid in the middle ear space. This space contains the bones for hearing and air. Air in the middle ear space helps to transmit sound.  The air gets there through the eustachian tube. This tube goes from the back of the nose (nasopharynx) to the middle ear space. It keeps the pressure in the middle ear the same as  the outside world. It also helps to drain fluid from the middle ear space. CAUSES  Serous otitis media occurs when the eustachian tube gets blocked. Blockage can come from:  Ear infections.  Colds and other upper respiratory infections.  Allergies.  Irritants such as cigarette smoke.  Sudden changes in air pressure (such as descending in an airplane).  Enlarged adenoids.  A mass in the nasopharynx. During colds and upper respiratory infections, the middle ear space can become temporarily filled with fluid. This can happen after an ear infection also. Once the infection clears, the fluid will generally drain out of the ear through the eustachian tube. If it does not, then serous otitis media occurs. SIGNS AND SYMPTOMS   Hearing loss.  A feeling of fullness in the ear, without pain.  Young children may not show any symptoms but may show slight behavioral changes, such as agitation, ear pulling, or crying. DIAGNOSIS  Serous otitis media is diagnosed by an ear exam. Tests may be done to check on the movement of the eardrum. Hearing exams may also be done. TREATMENT  The fluid most often goes away without treatment. If allergy is the cause, allergy treatment may be helpful. Fluid that persists for several months may require minor surgery. A small tube is placed in the eardrum to:  Drain the fluid.  Restore the air in the middle ear space. In certain situations, antibiotic medicines are used to avoid surgery. Surgery may be done to remove enlarged adenoids (if this is the cause). HOME CARE INSTRUCTIONS   Keep children away from tobacco smoke.  Keep all follow-up visits as directed by your health care provider. SEEK MEDICAL CARE IF:   Your hearing is not better in 3 months.  Your hearing is worse.  You have ear pain.  You have drainage from the ear.  You have dizziness.  You have serous otitis media only in one ear or have any bleeding from your nose (epistaxis).  You  notice a lump on your neck. MAKE SURE YOU:  Understand these instructions.   Will watch your condition.   Will get help right away if you are not doing well or get worse.  Document Released: 07/20/2003 Document Revised: 09/13/2013 Document Reviewed: 11/24/2012 [facility] Patient Information 2015 [place], [place]. This information is not intended to replace advice given to you by your health care provider. Make sure you discuss any questions you have with your health care provider.  Pneumonia Pneumonia is an infection of the lungs.  CAUSES Pneumonia may be caused by bacteria or a virus. Usually, these infections are caused by breathing infectious particles into the lungs (respiratory tract). SIGNS AND SYMPTOMS   Cough.  Fever.  Chest pain.  Increased rate of breathing.  Wheezing.  Mucus production. DIAGNOSIS  If you have the common symptoms of pneumonia, your health care provider will typically confirm the diagnosis with a chest X-ray. The X-ray will show an abnormality in the lung (pulmonary infiltrate) if you have pneumonia. Other tests of your blood, urine, or sputum may be done to find the specific cause of your pneumonia. Your health care provider may also do tests (blood gases or pulse oximetry) to see how well your lungs are working. TREATMENT  Some forms of pneumonia may be spread to other people when you cough or sneeze. You may be asked to wear a mask before and during your exam. Pneumonia that is caused by bacteria is treated with antibiotic medicine. Pneumonia that is caused by the influenza virus may be treated with an antiviral medicine. Most other viral infections must run their course. These infections will not respond to antibiotics.  HOME CARE INSTRUCTIONS   Cough suppressants may be used if you are losing too much rest. However, coughing protects you by clearing your lungs. You should avoid using cough suppressants if you can.  Your health care provider may have  prescribed medicine if he or she thinks your pneumonia is caused by bacteria or influenza. Finish your medicine even if you start to feel better.  Your health care provider may also prescribe an expectorant. This loosens the mucus to be coughed up.  Take medicines only as directed by your health care provider.  Do not smoke. Smoking is a common cause of bronchitis and can contribute to pneumonia. If you are a smoker and continue to smoke, your cough may last several weeks after your pneumonia has cleared.  A cold steam vaporizer or humidifier in your room or home may help loosen mucus.  Coughing is often worse at night. Sleeping in a semi-upright position in a recliner or using a couple pillows under your head will help with this.  Get rest as you feel it is needed. Your body will usually let you know when you need to rest. PREVENTION A pneumococcal shot (vaccine) is available to prevent  a common bacterial cause of pneumonia. This is usually suggested for:  People over 75 years old.  Patients on chemotherapy.  People with chronic lung problems, such as bronchitis or emphysema.  People with immune system problems. If you are over 65 or have a high risk condition, you may receive the pneumococcal vaccine if you have not received it before. In some countries, a routine influenza vaccine is also recommended. This vaccine can help prevent some cases of pneumonia.You may be offered the influenza vaccine as part of your care. If you smoke, it is time to quit. You may receive instructions on how to stop smoking. Your health care provider can provide medicines and counseling to help you quit. SEEK MEDICAL CARE IF: You have a fever. SEEK IMMEDIATE MEDICAL CARE IF:   Your illness becomes worse. This is especially true if you are elderly or weakened from any other disease.  You cannot control your cough with suppressants and are losing sleep.  You begin coughing up blood.  You develop pain  which is getting worse or is uncontrolled with medicines.  Any of the symptoms which initially brought you in for treatment are getting worse rather than better.  You develop shortness of breath or chest pain.  Diarrhea Diarrhea is frequent loose and watery bowel movements. It can cause you to feel weak and dehydrated. Dehydration can cause you to become tired and thirsty, have a dry mouth, and have decreased urination that often is dark yellow. Diarrhea is a sign of another problem, most often an infection that will not last long. In most cases, diarrhea typically lasts 2-3 days. However, it can last longer if it is a sign of something more serious. It is important to treat your diarrhea as directed by your caregiver to lessen or prevent future episodes of diarrhea. CAUSES  Some common causes include:  Gastrointestinal infections caused by viruses, bacteria, or parasites.  Food poisoning or food allergies.  Certain medicines, such as antibiotics, chemotherapy, and laxatives.  Artificial sweeteners and fructose.  Digestive disorders. HOME CARE INSTRUCTIONS  Ensure adequate fluid intake (hydration): Have 1 cup (8 oz) of fluid for each diarrhea episode. Avoid fluids that contain simple sugars or sports drinks, fruit juices, whole milk products, and sodas. Your urine should be clear or pale yellow if you are drinking enough fluids. Hydrate with an oral rehydration solution that you can purchase at pharmacies, retail stores, and online. You can prepare an oral rehydration solution at home by mixing the following ingredients together:   - tsp table salt.   tsp baking soda.   tsp salt substitute containing potassium chloride.  1  tablespoons sugar.  1 L (34 oz) of water.  Certain foods and beverages may increase the speed at which food moves through the gastrointestinal (GI) tract. These foods and beverages should be avoided and include:  Caffeinated and alcoholic  beverages.  High-fiber foods, such as raw fruits and vegetables, nuts, seeds, and whole grain breads and cereals.  Foods and beverages sweetened with sugar alcohols, such as xylitol, sorbitol, and mannitol.  Some foods may be well tolerated and may help thicken stool including:  Starchy foods, such as rice, toast, pasta, low-sugar cereal, oatmeal, grits, baked potatoes, crackers, and bagels.  Bananas.  Applesauce.  Add probiotic-rich foods to help increase healthy bacteria in the GI tract, such as yogurt and fermented milk products.  Wash your hands well after each diarrhea episode.  Only take over-the-counter or prescription medicines as directed by  your caregiver.  Take a warm bath to relieve any burning or pain from frequent diarrhea episodes. SEEK IMMEDIATE MEDICAL CARE IF:   You are unable to keep fluids down.  You have persistent vomiting.  You have blood in your stool, or your stools are black and tarry.  You do not urinate in 6-8 hours, or there is only a small amount of very dark urine.  You have abdominal pain that increases or localizes.  You have weakness, dizziness, confusion, or light-headedness.  You have a severe headache.  Your diarrhea gets worse or does not get better.  You have a fever or persistent symptoms for more than 2-3 days.  You have a fever and your symptoms suddenly get worse. MAKE SURE YOU:   Understand these instructions.  Will watch your condition.  Will get help right away if you are not doing well or get worse. Document Released: 04/19/2002 Document Revised: 09/13/2013 Document Reviewed: 01/05/2012 [facility] Patient Information 2015 [place], [place]. This information is not intended to replace advice given to you by your health care provider. Make sure you discuss any questions you have with your health care provider.  MAKE SURE YOU:   Understand these instructions.  Will watch your condition.  Will get help right away if you  are not doing well or get worse. Document Released: 04/29/2005 Document Revised: 09/13/2013 Document Reviewed: 07/19/2010 [facility] Patient Information 2015 [place], [place]. This information is not intended to replace advice given to you by your health care provider. Make sure you discuss any questions you have with your health care provider.

## 2014-08-25 LAB — CLOSTRIDIUM DIFFICILE BY PCR: Toxigenic C. Difficile by PCR: NOT DETECTED

## 2014-08-28 ENCOUNTER — Telehealth: Payer: Self-pay

## 2014-08-28 LAB — STOOL CULTURE

## 2014-08-28 NOTE — Telephone Encounter (Signed)
Looks like C Diff is negative and Stool Cx is still pending, but so far no growth. Please advise. Thanks  (Dr. Vonna Kotyk pt)

## 2014-08-28 NOTE — Telephone Encounter (Signed)
Pt called to check on her lab results from [phone]. She said she is still having diarrhea and nothing has improved since her last visit.

## 2014-08-29 NOTE — Telephone Encounter (Signed)
C. Diff and stool cultures all negative. She has taken another antibiotic in the meantime, so we still have to think about the possibility of C. Diff.  She needs to drink lots of liquids and get plenty of rest. As best she can, she needs to resume her regular eating habits, making sure she's getting plenty of fiber.  Cough can persist for weeks after a respiratory illness. She's had excellent treatment.  If she has persistent fever/chills or feels ILL, she needs to RTC.  If she doesn't, we can refill the cough medication and she can work on hydration and eating. If the diarrhea persists, however, or worsens, we need to recheck the stool for C. Diff.

## 2014-08-29 NOTE — Telephone Encounter (Signed)
Pt is still having watery diarrhea and a cough. Has been here 3 times and really doesn't want to come back. Wants to know what she should do. Please advise. Thanks

## 2014-08-30 ENCOUNTER — Ambulatory Visit (INDEPENDENT_AMBULATORY_CARE_PROVIDER_SITE_OTHER): Payer: 59 | Admitting: Family Medicine

## 2014-08-30 VITALS — BP 114/74 | HR 107 | Temp 98.3°F | Resp 18 | Ht 62.0 in | Wt 171.0 lb

## 2014-08-30 DIAGNOSIS — E860 Dehydration: Secondary | ICD-10-CM | POA: Diagnosis not present

## 2014-08-30 DIAGNOSIS — R197 Diarrhea, unspecified: Secondary | ICD-10-CM

## 2014-08-30 DIAGNOSIS — R05 Cough: Secondary | ICD-10-CM

## 2014-08-30 DIAGNOSIS — R059 Cough, unspecified: Secondary | ICD-10-CM

## 2014-08-30 DIAGNOSIS — R531 Weakness: Secondary | ICD-10-CM | POA: Diagnosis not present

## 2014-08-30 DIAGNOSIS — E119 Type 2 diabetes mellitus without complications: Secondary | ICD-10-CM

## 2014-08-30 LAB — POCT CBC
GRANULOCYTE PERCENT: 74.5 % (ref 37–80)
HCT, POC: 42.5 % (ref 37.7–47.9)
Hemoglobin: 13.9 g/dL (ref 12.2–16.2)
Lymph, poc: 3 (ref 0.6–3.4)
MCH, POC: 28.3 pg (ref 27–31.2)
MCHC: 32.8 g/dL (ref 31.8–35.4)
MCV: 86.5 fL (ref 80–97)
MID (CBC): 0.9 (ref 0–0.9)
MPV: 7.1 fL (ref 0–99.8)
PLATELET COUNT, POC: 349 10*3/uL (ref 142–424)
POC GRANULOCYTE: 11.2 — AB (ref 2–6.9)
POC LYMPH %: 19.7 % (ref 10–50)
POC MID %: 5.8 % (ref 0–12)
RBC: 4.92 M/uL (ref 4.04–5.48)
RDW, POC: 15 %
WBC: 15 10*3/uL — AB (ref 4.6–10.2)

## 2014-08-30 LAB — COMPREHENSIVE METABOLIC PANEL
ALBUMIN: 4.5 g/dL (ref 3.5–5.2)
ALT: 21 U/L (ref 0–35)
AST: 15 U/L (ref 0–37)
Alkaline Phosphatase: 94 U/L (ref 39–117)
BUN: 20 mg/dL (ref 6–23)
CALCIUM: 9.9 mg/dL (ref 8.4–10.5)
CHLORIDE: 99 meq/L (ref 96–112)
CO2: 25 meq/L (ref 19–32)
Creat: 1.21 mg/dL — ABNORMAL HIGH (ref 0.50–1.10)
GLUCOSE: 107 mg/dL — AB (ref 70–99)
POTASSIUM: 4.6 meq/L (ref 3.5–5.3)
SODIUM: 136 meq/L (ref 135–145)
TOTAL PROTEIN: 7.6 g/dL (ref 6.0–8.3)
Total Bilirubin: 0.5 mg/dL (ref 0.2–1.2)

## 2014-08-30 LAB — POCT URINALYSIS DIPSTICK
GLUCOSE UA: NEGATIVE
NITRITE UA: NEGATIVE
Protein, UA: NEGATIVE
Spec Grav, UA: >=1.03
Urobilinogen, UA: 0.2
pH, UA: 5

## 2014-08-30 LAB — POCT UA - MICROSCOPIC ONLY
CASTS, UR, LPF, POC: NEGATIVE
CRYSTALS, UR, HPF, POC: NEGATIVE
Mucus, UA: NEGATIVE
YEAST UA: NEGATIVE

## 2014-08-30 LAB — GLUCOSE, POCT (MANUAL RESULT ENTRY): POC GLUCOSE: 107 mg/dL — AB (ref 70–99)

## 2014-08-30 NOTE — Patient Instructions (Signed)
Continue using Imodium as needed  Drink plenty of fluids in order to keep himself urinating frequently  In the event of getting worse go to the emergency room if you have more abdominal pain or fever or passing blood or just feel worse.  Return tomorrow for recheck

## 2014-08-30 NOTE — Telephone Encounter (Signed)
Spoke with pt and gave her below message. She is still having diarrhea so I told her she needs to RTC. She says she will come in.

## 2014-08-30 NOTE — Progress Notes (Signed)
Subjective: Pleasant 64 year old lady who's been having health issues over the past month. On March 26 they flew back from [place]. That morning she had blowout diarrhea. She had a bad cough when she got here and was seen a day or 2 later. She did not mention the diarrhea because the cough was so bad, but she had had some episodes of diarrhea at that time. She has continued off and on over the last 3 weeks with episodic diarrhea. She may go a day or 2 without a lot of diarrhea and then she has multiple episodes. She gets a lot of rumbling in her stomach and and has explosive diarrhea. She feels like she's been drinking sufficient liquids, but is only urinated once today and it is now almost 3:00 in the afternoon. She has been feeling extremely weak. She tried vacuuming yesterday and that just about killed her. She is not having a lot of abdominal pain. She's been treated several times for the cough, the cough is finally doing better the last couple of days. That is not a significant concern today. She did have some abdominal wall pain when she was coughing a lot, but that was not from the diarrhea.  She is diabetic. Her husband was sick with a cough but he did not have the diarrhea does not have diarrhea. She has been worked up with stool for C. difficile and culture which were negative.  Objective: Pleasant lady in no major acute distress but is very fatigued feeling. She has some rumbling in her stomach. Her throat is clear. Mucous membranes still moist. Neck supple without nodes. Chest is clear to auscultation. Heart regular without murmurs. Abdomen has active bowel sounds. Soft without masses or tenderness. Skin turgor fair to a little poor.  Assessment: Diarrhea Cough improved Dehydration Weakness Type 2 diabetes without complication  Plan: Hydrated with a couple liters of IV fluids and see if that will help break the cycle of things. She did take some Imodium a couple days ago. Last night  she had about 5 episodes of diarrhea, has not had a fever the day today but feels like she is going to. She ate very little today, a part of an Egg McMuffin.  Results for orders placed or performed in visit on 08/30/14  POCT CBC  Result Value Ref Range   WBC 15.0 (A) 4.6 - 10.2 K/uL   Lymph, poc 3.0 0.6 - 3.4   POC LYMPH PERCENT 19.7 10 - 50 %L   MID (cbc) 0.9 0 - 0.9   POC MID % 5.8 0 - 12 %M   POC Granulocyte 11.2 (A) 2 - 6.9   Granulocyte percent 74.5 37 - 80 %G   RBC 4.92 4.04 - 5.48 M/uL   Hemoglobin 13.9 12.2 - 16.2 g/dL   HCT, POC 42.5 37.7 - 47.9 %   MCV 86.5 80 - 97 fL   MCH, POC 28.3 27 - 31.2 pg   MCHC 32.8 31.8 - 35.4 g/dL   RDW, POC 15.0 %   Platelet Count, POC 349 142 - 424 K/uL   MPV 7.1 0 - 99.8 fL  POCT UA - Microscopic Only  Result Value Ref Range   WBC, Ur, HPF, POC 0-1    RBC, urine, microscopic 0-1    Bacteria, U Microscopic trace    Mucus, UA neg    Epithelial cells, urine per micros 3-5    Crystals, Ur, HPF, POC neg    Casts, Ur, LPF, POC neg  Yeast, UA neg   POCT urinalysis dipstick  Result Value Ref Range   Color, UA yellow    Clarity, UA clear    Glucose, UA neg    Bilirubin, UA small    Ketones, UA trace    Spec Grav, UA >=1.030    Blood, UA trace-lysed    pH, UA 5.0    Protein, UA neg    Urobilinogen, UA 0.2    Nitrite, UA neg    Leukocytes, UA Trace   POCT glucose (manual entry)  Result Value Ref Range   POC Glucose 107 (A) 70 - 99 mg/dl   Leukocytosis is noted.  Patient received 2 L of IV fluid in abdomen reexamined. Still has active bowel sounds. The abdomen is soft.  Instructed her to go to the emergency room if she gets abruptly worsen any time. Needs to come back tomorrow and get a CBC and urine rechecked in abdomen reexamined. She is to take Imodium tonight.

## 2014-08-31 ENCOUNTER — Ambulatory Visit (INDEPENDENT_AMBULATORY_CARE_PROVIDER_SITE_OTHER): Payer: 59

## 2014-08-31 ENCOUNTER — Ambulatory Visit (INDEPENDENT_AMBULATORY_CARE_PROVIDER_SITE_OTHER): Payer: 59 | Admitting: Family Medicine

## 2014-08-31 VITALS — BP 118/64 | HR 86 | Temp 98.1°F | Resp 18 | Ht 62.0 in | Wt 171.0 lb

## 2014-08-31 DIAGNOSIS — R05 Cough: Secondary | ICD-10-CM | POA: Diagnosis not present

## 2014-08-31 DIAGNOSIS — R197 Diarrhea, unspecified: Secondary | ICD-10-CM | POA: Diagnosis not present

## 2014-08-31 DIAGNOSIS — R059 Cough, unspecified: Secondary | ICD-10-CM

## 2014-08-31 DIAGNOSIS — R1030 Lower abdominal pain, unspecified: Secondary | ICD-10-CM | POA: Diagnosis not present

## 2014-08-31 LAB — BASIC METABOLIC PANEL
BUN: 14 mg/dL (ref 6–23)
CALCIUM: 9.5 mg/dL (ref 8.4–10.5)
CO2: 23 mEq/L (ref 19–32)
Chloride: 102 mEq/L (ref 96–112)
Creat: 0.9 mg/dL (ref 0.50–1.10)
GLUCOSE: 119 mg/dL — AB (ref 70–99)
POTASSIUM: 4.7 meq/L (ref 3.5–5.3)
SODIUM: 137 meq/L (ref 135–145)

## 2014-08-31 LAB — POCT CBC
Granulocyte percent: 67.6 %G (ref 37–80)
HEMATOCRIT: 38.3 % (ref 37.7–47.9)
Hemoglobin: 12.7 g/dL (ref 12.2–16.2)
LYMPH, POC: 2.1 (ref 0.6–3.4)
MCH, POC: 28.4 pg (ref 27–31.2)
MCHC: 33.1 g/dL (ref 31.8–35.4)
MCV: 85.8 fL (ref 80–97)
MID (cbc): 0.5 (ref 0–0.9)
MPV: 6.9 fL (ref 0–99.8)
POC GRANULOCYTE: 5.5 (ref 2–6.9)
POC LYMPH %: 26.1 % (ref 10–50)
POC MID %: 6.3 % (ref 0–12)
Platelet Count, POC: 308 10*3/uL (ref 142–424)
RBC: 4.47 M/uL (ref 4.04–5.48)
RDW, POC: 14.9 %
WBC: 8.1 10*3/uL (ref 4.6–10.2)

## 2014-08-31 LAB — POCT URINALYSIS DIPSTICK
BILIRUBIN UA: NEGATIVE
GLUCOSE UA: NEGATIVE
KETONES UA: NEGATIVE
Leukocytes, UA: NEGATIVE
Protein, UA: NEGATIVE
RBC UA: NEGATIVE
SPEC GRAV UA: 1.02
Urobilinogen, UA: 0.2
pH, UA: 5.5

## 2014-08-31 MED ORDER — DICYCLOMINE HCL 10 MG PO CAPS: ORAL_CAPSULE | ORAL | Status: DC

## 2014-08-31 NOTE — Patient Instructions (Signed)
Drink lots of fluids still  Avoid artificial sweeteners  Take the dicyclomine 1 or 2 pills 4 times daily before meals and at bedtime for the diarrhea  If you have more pain or are not improving over the next 2 or 3 days please return and get rechecked yet again.  It is important that you drink enough liquids that you are urinating frequently.

## 2014-08-31 NOTE — Progress Notes (Signed)
Subjective: Patient was here last night with diarrhea. She was pretty dehydrated. She was given 2 L of fluids. She has continued to have diarrhea overnight. Still feels very weak. She has some low abdominal discomfort. He is drinking some water, though not a lot of it. The cough has come back.  Objective: Lungs clear. Abdomen has active bowel sounds. Soft without organomegaly. Tender across lower abdomen. Prominent central scar tissue from old surgery.  Labs from last night have a 50% bump in her creatinine level from 3 months earlier consistent with significant dehydration  Assessment: Diarrhea Dehydration Cough  Plan: Urine dip, CBC to recheck on the leukocytosis, bmet to recheck on the creatinine, abdominal x-ray series  [facility] reading (PRIMARY) by  Dr. Linna Darner Nonspecific abdomen and chest.  Old hardware lumbar spine.  Results for orders placed or performed in visit on 08/31/14  POCT urinalysis dipstick  Result Value Ref Range   Color, UA yellow    Clarity, UA clear    Glucose, UA neg    Bilirubin, UA neg    Ketones, UA neg    Spec Grav, UA 1.020    Blood, UA neg    pH, UA 5.5    Protein, UA neg    Urobilinogen, UA 0.2    Nitrite, UA nerg    Leukocytes, UA Negative   POCT CBC  Result Value Ref Range   WBC 8.1 4.6 - 10.2 K/uL   Lymph, poc 2.1 0.6 - 3.4   POC LYMPH PERCENT 26.1 10 - 50 %L   MID (cbc) 0.5 0 - 0.9   POC MID % 6.3 0 - 12 %M   POC Granulocyte 5.5 2 - 6.9   Granulocyte percent 67.6 37 - 80 %G   RBC 4.47 4.04 - 5.48 M/uL   Hemoglobin 12.7 12.2 - 16.2 g/dL   HCT, POC 38.3 37.7 - 47.9 %   MCV 85.8 80 - 97 fL   MCH, POC 28.4 27 - 31.2 pg   MCHC 33.1 31.8 - 35.4 g/dL   RDW, POC 14.9 %   Platelet Count, POC 308 142 - 424 K/uL   MPV 6.9 0 - 99.8 fL   . Will treat symptomatically for the diarrhea and cough.  Return if not improving. May ultimately need a scan of the abdomen also.  Although she is still having diarrhea, think that he dehydration face has  improved a little bit with the hydration last night. Urged her that she needs to keep on drinking lots more fluids. She has been taking things with artificial sweeteners, that might be adding to diarrhea so we will discontinue that. Try dicyclomine for the diarrhea.

## 2014-09-01 ENCOUNTER — Emergency Department (HOSPITAL_COMMUNITY): Payer: 59

## 2014-09-01 ENCOUNTER — Telehealth: Payer: Self-pay

## 2014-09-01 ENCOUNTER — Emergency Department (HOSPITAL_COMMUNITY)
Admission: EM | Admit: 2014-09-01 | Discharge: 2014-09-01 | Disposition: A | Discharge status: Home / Self Care | Payer: 59 | Attending: Emergency Medicine | Admitting: Emergency Medicine

## 2014-09-01 ENCOUNTER — Encounter (HOSPITAL_COMMUNITY): Payer: Self-pay | Admitting: Emergency Medicine

## 2014-09-01 DIAGNOSIS — Z9849 Cataract extraction status, unspecified eye: Secondary | ICD-10-CM | POA: Diagnosis not present

## 2014-09-01 DIAGNOSIS — Z7982 Long term (current) use of aspirin: Secondary | ICD-10-CM | POA: Diagnosis not present

## 2014-09-01 DIAGNOSIS — F419 Anxiety disorder, unspecified: Secondary | ICD-10-CM | POA: Insufficient documentation

## 2014-09-01 DIAGNOSIS — E785 Hyperlipidemia, unspecified: Secondary | ICD-10-CM | POA: Diagnosis not present

## 2014-09-01 DIAGNOSIS — M1990 Unspecified osteoarthritis, unspecified site: Secondary | ICD-10-CM | POA: Diagnosis not present

## 2014-09-01 DIAGNOSIS — Z791 Long term (current) use of non-steroidal anti-inflammatories (NSAID): Secondary | ICD-10-CM | POA: Diagnosis not present

## 2014-09-01 DIAGNOSIS — I10 Essential (primary) hypertension: Secondary | ICD-10-CM | POA: Diagnosis not present

## 2014-09-01 DIAGNOSIS — J45909 Unspecified asthma, uncomplicated: Secondary | ICD-10-CM | POA: Insufficient documentation

## 2014-09-01 DIAGNOSIS — F329 Major depressive disorder, single episode, unspecified: Secondary | ICD-10-CM | POA: Insufficient documentation

## 2014-09-01 DIAGNOSIS — Z3202 Encounter for pregnancy test, result negative: Secondary | ICD-10-CM | POA: Diagnosis not present

## 2014-09-01 DIAGNOSIS — R197 Diarrhea, unspecified: Secondary | ICD-10-CM | POA: Diagnosis not present

## 2014-09-01 DIAGNOSIS — Z792 Long term (current) use of antibiotics: Secondary | ICD-10-CM | POA: Insufficient documentation

## 2014-09-01 DIAGNOSIS — R1084 Generalized abdominal pain: Secondary | ICD-10-CM | POA: Diagnosis not present

## 2014-09-01 DIAGNOSIS — Z79899 Other long term (current) drug therapy: Secondary | ICD-10-CM | POA: Insufficient documentation

## 2014-09-01 DIAGNOSIS — R112 Nausea with vomiting, unspecified: Secondary | ICD-10-CM | POA: Insufficient documentation

## 2014-09-01 LAB — COMPREHENSIVE METABOLIC PANEL
ALBUMIN: 4 g/dL (ref 3.5–5.2)
ALK PHOS: 82 U/L (ref 39–117)
ALT: 19 U/L (ref 0–35)
AST: 15 U/L (ref 0–37)
Anion gap: 9 (ref 5–15)
BUN: 15 mg/dL (ref 6–23)
CALCIUM: 8.9 mg/dL (ref 8.4–10.5)
CO2: 24 mmol/L (ref 19–32)
Chloride: 104 mmol/L (ref 96–112)
Creatinine, Ser: 0.98 mg/dL (ref 0.50–1.10)
GFR calc Af Amer: 70 mL/min — ABNORMAL LOW (ref >90)
GFR calc non Af Amer: 61 mL/min — ABNORMAL LOW (ref >90)
GLUCOSE: 97 mg/dL (ref 70–99)
Potassium: 3.7 mmol/L (ref 3.5–5.1)
SODIUM: 137 mmol/L (ref 135–145)
TOTAL PROTEIN: 7.3 g/dL (ref 6.0–8.3)
Total Bilirubin: 0.4 mg/dL (ref 0.3–1.2)

## 2014-09-01 LAB — CBC WITH DIFFERENTIAL/PLATELET
Basophils Absolute: 0 10*3/uL (ref 0.0–0.1)
Basophils Relative: 0 % (ref 0–1)
EOS ABS: 0.4 10*3/uL (ref 0.0–0.7)
EOS PCT: 3 % (ref 0–5)
HCT: 39 % (ref 36.0–46.0)
HEMOGLOBIN: 12.6 g/dL (ref 12.0–15.0)
LYMPHS PCT: 20 % (ref 12–46)
Lymphs Abs: 2.3 10*3/uL (ref 0.7–4.0)
MCH: 28.9 pg (ref 26.0–34.0)
MCHC: 32.3 g/dL (ref 30.0–36.0)
MCV: 89.4 fL (ref 78.0–100.0)
Monocytes Absolute: 0.6 10*3/uL (ref 0.1–1.0)
Monocytes Relative: 5 % (ref 3–12)
NEUTROS PCT: 72 % (ref 43–77)
Neutro Abs: 8.2 10*3/uL — ABNORMAL HIGH (ref 1.7–7.7)
PLATELETS: 250 10*3/uL (ref 150–400)
RBC: 4.36 MIL/uL (ref 3.87–5.11)
RDW: 13.8 % (ref 11.5–15.5)
WBC: 11.5 10*3/uL — AB (ref 4.0–10.5)

## 2014-09-01 LAB — I-STAT TROPONIN, ED: Troponin i, poc: 0 ng/mL (ref 0.00–0.08)

## 2014-09-01 LAB — URINALYSIS, ROUTINE W REFLEX MICROSCOPIC
Bilirubin Urine: NEGATIVE
Glucose, UA: NEGATIVE mg/dL
Hgb urine dipstick: NEGATIVE
Ketones, ur: NEGATIVE mg/dL
Nitrite: NEGATIVE
PROTEIN: NEGATIVE mg/dL
Specific Gravity, Urine: 1.01 (ref 1.005–1.030)
UROBILINOGEN UA: 0.2 mg/dL (ref 0.0–1.0)
pH: 5.5 (ref 5.0–8.0)

## 2014-09-01 LAB — URINE MICROSCOPIC-ADD ON

## 2014-09-01 LAB — LIPASE, BLOOD: LIPASE: 22 U/L (ref 11–59)

## 2014-09-01 LAB — POC OCCULT BLOOD, ED: Fecal Occult Bld: NEGATIVE

## 2014-09-01 LAB — PREGNANCY, URINE: Preg Test, Ur: NEGATIVE — AB

## 2014-09-01 MED ORDER — IOHEXOL 300 MG/ML  SOLN
100.0000 mL | Freq: Once | INTRAMUSCULAR | Status: AC | PRN
  Administered 2014-09-01: 100 mL via INTRAVENOUS

## 2014-09-01 MED ORDER — IBUPROFEN 800 MG PO TABS
800.0000 mg | ORAL_TABLET | Freq: Once | ORAL | Status: AC
  Administered 2014-09-01: 800 mg via ORAL
  Filled 2014-09-01: qty 1

## 2014-09-01 MED ORDER — SODIUM CHLORIDE 0.9 % IV BOLUS (SEPSIS)
1000.0000 mL | Freq: Once | INTRAVENOUS | Status: AC
Start: 2014-09-01 — End: 2014-09-01
  Administered 2014-09-01: 1000 mL via INTRAVENOUS

## 2014-09-01 MED ORDER — ONDANSETRON HCL 4 MG PO TABS: 4.0000 mg | ORAL_TABLET | Freq: Three times a day (TID) | ORAL | Status: DC | PRN

## 2014-09-01 NOTE — ED Notes (Signed)
PA at bedside.

## 2014-09-01 NOTE — Telephone Encounter (Signed)
Patient is calling because she was told by Dr. Linna Darner that if she feel worse to go to the hospital and she wants [place] to call the hospital and let them know that she is coming. Patient was told that she can head over to the hospital and no call is necessary in order for her to be treated. Patient states that she has been throwing up and has diarrhea non stop.

## 2014-09-01 NOTE — ED Notes (Signed)
Pt ambulated to restroom with steady gait.

## 2014-09-01 NOTE — ED Notes (Signed)
Pt has been sick since 3/27.  States that she has been seen 5 times since then.  States that she started having cough and diarrhea in march and then progressed to vomiting.  Has not been able to keep water down today.  Pt states that she has had labwork done.  Pt points to mid abd where pain is.  Had xrays taken.

## 2014-09-01 NOTE — ED Notes (Signed)
Hemocult negative per lab, results not crossing over to pt chart.

## 2014-09-01 NOTE — ED Notes (Addendum)
Pt requesting medication for headache. Pa made aware.

## 2014-09-01 NOTE — Telephone Encounter (Signed)
We don't call the hospital, they can see the notes in the computer if needed. FYI Dr. Linna Darner.

## 2014-09-01 NOTE — ED Provider Notes (Signed)
CSN: [id number]     Arrival date & time 09/01/14  1451 History   First MD Initiated Contact with Patient 09/01/14 1529     Chief Complaint  Patient presents with  . Nausea  . Emesis  . Diarrhea     (Consider location/radiation/quality/duration/timing/severity/associated sxs/prior Treatment) HPI Jodi Williams is a 64 year old female who presents the ER with ongoing diarrhea for the past month. Patient states since 08/07/14 she has been experiencing multiple episodes of diarrhea every day. Patient reports some persistent URI symptoms. Patient states she has been seen 5 times by her primary care doctor in the past month for the signs and symptoms. Patient states she is seen twice over the past 2 days and rehydrated with IV fluids. Patient states that over the past 24 hours she has began vomiting again. With several episodes of nonbilious, nonbloody emesis. Patient reports up to 20 episodes of loose, watery diarrhea per day. Patient reports nausea, minimal appetite. Patient describes an right upper abdominal discomfort, however points to her right lower chest. Patient states this has been mild, she has only noticed it beginning of the past 24 hours. Patient denies fever, chills, shortness of breath, chest pain, dizziness, weakness, headache, blurred vision, dysuria. Patient states that her main concern was that she has been ongoing, uncontrollable symptoms despite use of IV fluids at her PCP, as well as Imodium last night.  Past Medical History  Diagnosis Date  . Depression   . Anxiety   . Allergy   . Arthritis   . Cataract   . Ulcer   . Hyperlipidemia   . Hypertension   . Asthma    Past Surgical History  Procedure Laterality Date  . Breast surgery    . Back surgery    . Hand surgery    . Eye surgery      cataract  . Knee surgery     Family History  Problem Relation Age of Onset  . Hyperlipidemia Mother   . Hypertension Mother   . Heart disease Mother   . Dementia Mother   .  Arthritis Mother   . Neuropathy Father   . Diabetes Father   . Kidney disease Father    History  Substance Use Topics  . Smoking status: Never Smoker   . Smokeless tobacco: Never Used  . Alcohol Use: No   OB History    No data available     Review of Systems  Constitutional: Negative for fever.  HENT: Negative for trouble swallowing.   Eyes: Negative for visual disturbance.  Respiratory: Negative for shortness of breath.   Cardiovascular: Negative for chest pain.  Gastrointestinal: Positive for nausea, vomiting and diarrhea. Negative for abdominal pain.  Genitourinary: Negative for dysuria.  Musculoskeletal: Negative for neck pain.  Skin: Negative for rash.  Neurological: Negative for dizziness, weakness and numbness.  Psychiatric/Behavioral: Negative.       Allergies  Omnicef  Home Medications   Prior to Admission medications   Medication Sig Start Date End Date Taking? Authorizing Provider  acetaminophen (TYLENOL) 650 MG CR tablet Take 650 mg by mouth every 8 (eight) hours as needed for pain (pain).    Yes Historical Provider, MD  albuterol (PROVENTIL HFA;VENTOLIN HFA) 108 (90 BASE) MCG/ACT inhaler Inhale 2 puffs into the lungs every 4 (four) hours as needed for wheezing or shortness of breath (cough, shortness of breath or wheezing.). 08/14/14  Yes Roselee Culver, MD  aspirin 81 MG tablet Take 81 mg by mouth daily.  Yes Historical Provider, MD  buPROPion (WELLBUTRIN XL) 300 MG 24 hr tablet TAKE 1 TABLET BY MOUTH EVERY DAY 01/24/14  Yes Jessica C Copland, MD  citalopram (CELEXA) 20 MG tablet TAKE 2 TABLETS BY MOUTH EVERY DAY Patient taking differently: Take 20 mg by mouth daily.  01/24/14  Yes Jessica C Copland, MD  clonazePAM (KLONOPIN) 2 MG tablet TAKE 1/2 TO 1 TABLET BY MOUTH EVERY NIGHT AT BEDTIME AS NEEDED FOR SLEEP Patient taking differently: TAKE 1 TABLET BY MOUTH EVERY NIGHT AT BEDTIME AS NEEDED FOR SLEEP 08/19/14  Yes Gay Filler Copland, MD  dicyclomine (BENTYL)  10 MG capsule Take 1 or 2 pills before meals and at bedtime for diarrhea Patient taking differently: Take 10 mg by mouth at bedtime.  08/31/14  Yes Posey Boyer, MD  guaifenesin (ROBITUSSIN) 100 MG/5ML syrup Take 200 mg by mouth 3 (three) times daily as needed for cough (cough).   Yes Historical Provider, MD  levofloxacin (LEVAQUIN) 500 MG tablet Take 1 tablet (500 mg total) by mouth daily. 08/23/14  Yes Wendie Agreste, MD  lisinopril-hydrochlorothiazide (PRINZIDE,ZESTORETIC) 10-12.5 MG per tablet Take 1 tablet by mouth daily. 01/24/14  Yes Gay Filler Copland, MD  meloxicam (MOBIC) 7.5 MG tablet Take 1 tablet (7.5 mg total) by mouth daily. 01/24/14  Yes Gay Filler Copland, MD  metFORMIN (GLUCOPHAGE) 500 MG tablet Take 1 tablet (500 mg total) by mouth 2 (two) times daily with a meal. Start 1 at bedtime for one week Patient taking differently: Take 500 mg by mouth 2 (two) times daily with a meal.  08/24/13  Yes Gay Filler Copland, MD  Omega-3 Fatty Acids (FISH OIL) 1000 MG CAPS Take 2 capsules by mouth daily.    Yes Historical Provider, MD  omeprazole (PRILOSEC) 20 MG capsule Take 20 mg by mouth daily.   Yes Historical Provider, MD  pravastatin (PRAVACHOL) 20 MG tablet Take 1 tablet (20 mg total) by mouth daily. May take every 2nd or 3rd day if aches 01/25/14  Yes Gay Filler Copland, MD  Red Yeast Rice Extract (RED YEAST RICE PO) Take 1 tablet by mouth daily.   Yes Historical Provider, MD  ondansetron (ZOFRAN) 4 MG tablet Take 1 tablet (4 mg total) by mouth every 8 (eight) hours as needed for nausea or vomiting. 09/01/14   Dahlia Bailiff, PA-C  pramipexole (MIRAPEX) 0.125 MG tablet Take 1 tablet (0.125 mg total) by mouth daily. 01/24/14   Gay Filler Copland, MD   BP 112/55 mmHg  Pulse 74  Temp(Src) 98.2 F (36.8 C) (Oral)  Resp 17  SpO2 100% Physical Exam  Constitutional: She is oriented to person, place, and time. She appears well-developed and well-nourished. No distress.  HENT:  Head: Normocephalic and  atraumatic.  Mouth/Throat: Oropharynx is clear and moist. No oropharyngeal exudate.  Eyes: Right eye exhibits no discharge. Left eye exhibits no discharge. No scleral icterus.  Neck: Normal range of motion.  Cardiovascular: Normal rate, regular rhythm and normal heart sounds.   No murmur heard. Pulmonary/Chest: Effort normal and breath sounds normal. No respiratory distress.  Abdominal: Soft. Normal appearance and bowel sounds are normal. There is generalized tenderness. There is no rigidity, no guarding, no tenderness at McBurney's point and negative Murphy's sign.  Musculoskeletal: Normal range of motion. She exhibits no edema or tenderness.  Neurological: She is alert and oriented to person, place, and time. No cranial nerve deficit. Coordination normal.  Skin: Skin is warm and dry. No rash noted. She is not diaphoretic.  Psychiatric: She has a normal mood and affect.  Nursing note and vitals reviewed.   ED Course  Procedures (including critical care time) Labs Review Labs Reviewed  CBC WITH DIFFERENTIAL/PLATELET - Abnormal; Notable for the following:    WBC 11.5 (*)    Neutro Abs 8.2 (*)    All other components within normal limits  COMPREHENSIVE METABOLIC PANEL - Abnormal; Notable for the following:    GFR calc non Af Amer 61 (*)    GFR calc Af Amer 70 (*)    All other components within normal limits  URINALYSIS, ROUTINE W REFLEX MICROSCOPIC - Abnormal; Notable for the following:    Leukocytes, UA TRACE (*)    All other components within normal limits  PREGNANCY, URINE - Abnormal; Notable for the following:    Preg Test, Ur NEGATIVE (*)    All other components within normal limits  URINE MICROSCOPIC-ADD ON - Abnormal; Notable for the following:    Bacteria, UA FEW (*)    All other components within normal limits  LIPASE, BLOOD  OCCULT BLOOD X 1 CARD TO LAB, STOOL  I-STAT TROPOININ, ED  POC OCCULT BLOOD, ED    Imaging Review Ct Abdomen Pelvis W Contrast  09/01/2014    CLINICAL DATA:  Cough and diarrhea beginning in 07/2014 progressing to vomiting. Mid abdominal pain.  EXAM: CT ABDOMEN AND PELVIS WITH CONTRAST  TECHNIQUE: Multidetector CT imaging of the abdomen and pelvis was performed using the standard protocol following bolus administration of intravenous contrast.  CONTRAST:  158mL OMNIPAQUE IOHEXOL 300 MG/ML  SOLN  COMPARISON:  Abdominal ultrasound 10/06/2012  FINDINGS: There is a noncalcified 3 mm nodule in the left lower lobe (series 8, image 10). There is also the suggestion of an additional 3 mm nodule on the entry slice in the left lower lobe (series 8, image 1). Calcified 3 mm granulomas are noted in the left lower lobe and lingula. Subsegmental atelectasis and scarring are noted in the right middle lobe and right lower lobe. There is no pleural effusion.  Decreased attenuation of the liver is suggestive mild steatosis. The gallbladder, spleen, adrenal glands, left kidney, and pancreas have an unremarkable enhanced appearance. A splenule is noted inferior to the splenic hilum. Two subcentimeter low-density lesions in the upper pole of the right kidney are too small to fully characterize but most likely represent cysts.  There is a small sliding hiatal hernia. Oral contrast is present in loops of mid to distal small bowel large bowel to the level of the hepatic flexure. There is no evidence of bowel obstruction. A moderate amount of colonic stool is noted. Appendix is identified in the right lower quadrant and is unremarkable.  No free fluid or enlarged lymph nodes are identified. Uterus and ovaries are identified. No pelvic mass is seen. Bladder is unremarkable. The sequelae of prior L5-S1 posterior fusion are identified. Grade 1 anterolisthesis of L5 on S1 measures approximately 8 mm.  IMPRESSION: 1. No acute abnormality identified in the abdomen or pelvis. 2. Mild hepatic steatosis. 3. Small sliding hiatal hernia. 4. One or two 3 mm left lower lobe lung nodules. If  the patient is at high risk for bronchogenic carcinoma, follow-up chest CT at 1 year is recommended. If the patient is at low risk, no follow-up is needed. This recommendation follows the consensus statement: Guidelines for Management of Small Pulmonary Nodules Detected on CT Scans: A Statement from the [employer] as published in Radiology 2005; 237:395-400.   Electronically Signed  By: Logan Bores   On: 09/01/2014 18:33   Dg Abd Acute W/chest  08/31/2014   CLINICAL DATA:  Diarrhea, lower abdominal pain  EXAM: DG ABDOMEN ACUTE W/ 1V CHEST  COMPARISON:  Chest x-ray 08/23/2014  FINDINGS: The bowel gas pattern is normal. There is no evidence of free intraperitoneal air. No suspicious radio-opaque calculi or other significant radiographic abnormality is seen. Heart size and mediastinal contours are within normal limits. Both lungs are clear.  No acute bony abnormality. Postoperative changes in the lower lumbar spine.  IMPRESSION: Negative abdominal radiographs.  No acute cardiopulmonary disease.   Electronically Signed   By: Rolm Baptise M.D.   On: 08/31/2014 12:36     EKG Interpretation None      MDM   Final diagnoses:  Diarrhea   Patient here after 1 month of persistent diarrhea. Patient recently on several courses of antibiotics, recently had stool PCR done with C. difficile negative. Stool PCR was negative for any other pathology. Patient been seen by several providers recently. Recent been hydrated IV over the past several days. Patient is well-appearing on exam here. Abdominal exam benign. Due the fact is been ongoing for a month, patient has been followed up with CT abdomen pelvis.  CT abdomen pelvis with impression: 1. No acute abnormality identified in the abdomen or pelvis. 2. Mild hepatic steatosis. 3. Small sliding hiatal hernia. 4. One or two 3 mm left lower lobe lung nodules. If the patient is at high risk for bronchogenic carcinoma, follow-up chest CT at 1 year is  recommended. If the patient is at low risk, no follow-up is needed. This recommendation follows the consensus statement: Guidelines for Management of Small Pulmonary Nodules Detected on CT Scans: A Statement from the [place] as published in Radiology 2005; 237:395-400.  Discussed results with patient. Patient is Hemoccult note negative. On reexamination, abdominal exam is benign. There is no concern for acute abdomen. All other labs during workup here are unremarkable for acute pathology. No leukocytosis or anemia. Renal function is intact, has improved over the past 3 days after fluid replacement at urgent care, and fluids were also repleted here. No left-sided abnormalities noted. UA unremarkable for acute pathology. Patient's diarrheal illness has no clear etiology, however patient is hemodialysis stable, does not have any acute pathology recognizable here. Patient to be discharged home to follow-up with her gastroenterologist. After treatment and workup here, patient is asymptomatic. Discussed return precautions with patient, patient verbalizes understanding and agreement this plan. Encouraged patient to call or return to the ER with any worsening symptoms or should she have any questions or concerns.  BP 112/55 mmHg  Pulse 74  Temp(Src) 98.2 F (36.8 C) (Oral)  Resp 17  SpO2 100%  Signed,  Dahlia Bailiff, PA-C 10:31 PM   Dahlia Bailiff, PA-C 09/01/14 2232  Quintella Reichert, MD 09/01/14 2249

## 2014-09-01 NOTE — ED Notes (Signed)
Pt getting dressed and family reports that "she's tired of waiting".

## 2014-09-01 NOTE — Discharge Instructions (Signed)
Chronic Diarrhea  Diarrhea is frequent loose and watery bowel movements. It can cause you to feel weak and dehydrated. Dehydration can cause you to become tired and thirsty and to have a dry mouth, decreased urination, and dark yellow urine. Diarrhea is a sign of another problem, most often an infection that will not last long. In most cases, diarrhea lasts 2-3 days. Diarrhea that lasts longer than 4 weeks is called long-lasting (chronic) diarrhea. It is important to treat your diarrhea as directed by your health care provider to lessen or prevent future episodes of diarrhea.   CAUSES   There are many causes of chronic diarrhea. The following are some possible causes:   · Gastrointestinal infections caused by viruses, bacteria, or parasites.    · Food poisoning or food allergies.    · Certain medicines, such as antibiotics, chemotherapy, and laxatives.    · Artificial sweeteners and fructose.    · Digestive disorders, such as celiac disease and inflammatory bowel diseases.    · Irritable bowel syndrome.  · Some disorders of the pancreas.  · Disorders of the thyroid.  · Reduced blood flow to the intestines.  · Cancer.  Sometimes the cause of chronic diarrhea is unknown.  RISK FACTORS  · Having a severely weakened immune system, such as from HIV or AIDS.    · Taking certain types of cancer-fighting drugs (such as with chemotherapy) or other medicines.    · Having had a recent organ transplant.    · Having a portion of the stomach or small bowel removed.    · Traveling to countries where food and water supplies are often contaminated.    SYMPTOMS   In addition to frequent, loose stools, diarrhea may cause:   · Cramping.    · Abdominal pain.    · Nausea.    · Fever.  · Fatigue.  · Urgent need to use the bathroom.  · Loss of bowel control.  DIAGNOSIS   Your health care provider must take a careful history and perform a physical exam. Tests given are based on your symptoms and history. Tests may include:   · Blood or  stool tests. Three or more stool samples may be examined. Stool cultures may be used to test for bacteria or parasites.    · X-rays.    · A procedure in which a thin tube is inserted into the mouth or rectum (endoscopy). This allows the health care provider to look inside the intestine.    TREATMENT   · Treatment is aimed at correcting the cause of the diarrhea when possible.  · Diarrhea caused by an infection can often be treated with antibiotic medicines.  · Diarrhea not caused by an infection may require you to take long-term medicine or have surgery. Specific treatment should be discussed with your health care provider.  · If the cause cannot be determined, treatment aims to relieve symptoms and prevent dehydration. Serious health problems can occur if you do not maintain proper fluid levels. Treatment may include:  ¨ Taking an oral rehydration solution (ORS).  ¨ Not drinking beverages that contain caffeine (such as tea, coffee, and soft drinks).  ¨ Not drinking alcohol.  ¨ Maintaining well-balanced nutrition to help you recover faster.  HOME CARE INSTRUCTIONS   · Drink enough fluids to keep urine clear or pale yellow. Drink 1 cup (8 oz) of fluid for each diarrhea episode. Avoid fluids that contain simple sugars, fruit juices, whole milk products, and sodas. Hydrate with an ORS. You may purchase the ORS or prepare it at home by mixing the   following ingredients together:  ¨  - tsp (1.7-3  mL) table salt.  ¨ ¾ tsp (3 ¾ mL) baking soda.  ¨  tsp (1.7 mL) salt substitute containing potassium chloride.  ¨ 1 tbsp (20 mL) sugar.  ¨ 4.2 c (1 L) of water.    · Certain foods and beverages may increase the speed at which food moves through the gastrointestinal (GI) tract. These foods and beverages should be avoided. They include:  ¨ Caffeinated and alcoholic beverages.  ¨ High-fiber foods, such as raw fruits and vegetables, nuts, seeds, and whole grain breads and cereals.  ¨ Foods and beverages sweetened with sugar  alcohols, such as xylitol, sorbitol, and mannitol.    · Some foods may be well tolerated and may help thicken stool. These include:  ¨ Starchy foods, such as rice, toast, pasta, low-sugar cereal, oatmeal, grits, baked potatoes, crackers, and bagels.  ¨ Bananas.  ¨ Applesauce.  · Add probiotic-rich foods to help increase healthy bacteria in the GI tract. These include yogurt and fermented milk products.  · Wash your hands well after each diarrhea episode.  · Only take over-the-counter or prescription medicines as directed by your health care provider.  · Take a warm bath to relieve any burning or pain from frequent diarrhea episodes.  SEEK MEDICAL CARE IF:   · You are not urinating as often.  · Your urine is a dark color.  · You become very tired or dizzy.  · You have severe pain in the abdomen or rectum.  · Your have blood or pus in your stools.  · Your stools look black and tarry.  SEEK IMMEDIATE MEDICAL CARE IF:   · You are unable to keep fluids down.  · You have persistent vomiting.  · You have blood in your stool.  · Your stools are black and tarry.  · You do not urinate in 6-8 hours, or there is only a small amount of very dark urine.  · You have abdominal pain that increases or localizes.  · You have weakness, dizziness, confusion, or lightheadedness.  · You have a severe headache.  · Your diarrhea gets worse or does not get better.  · You have a fever or persistent symptoms for more than 2-3 days.  · You have a fever and your symptoms suddenly get worse.  MAKE SURE YOU:   · Understand these instructions.  · Will watch your condition.  · Will get help right away if you are not doing well or get worse.  Document Released: 07/20/2003 Document Revised: 05/04/2013 Document Reviewed: 10/22/2012  [employer]® Patient Information ©2015 [employer]. This information is not intended to replace advice given to you by your health care provider. Make sure you discuss any questions you have with your health care  provider.

## 2014-09-05 NOTE — Telephone Encounter (Signed)
Report noted.

## 2014-09-21 ENCOUNTER — Other Ambulatory Visit: Payer: Self-pay | Admitting: Family Medicine

## 2014-09-21 DIAGNOSIS — G4700 Insomnia, unspecified: Secondary | ICD-10-CM

## 2014-09-26 ENCOUNTER — Other Ambulatory Visit: Payer: Self-pay | Admitting: Family Medicine

## 2014-10-24 ENCOUNTER — Ambulatory Visit (INDEPENDENT_AMBULATORY_CARE_PROVIDER_SITE_OTHER): Payer: 59 | Admitting: Family Medicine

## 2014-10-24 VITALS — BP 122/70 | HR 88 | Temp 97.7°F | Resp 17 | Ht 62.0 in | Wt 178.6 lb

## 2014-10-24 DIAGNOSIS — H8110 Benign paroxysmal vertigo, unspecified ear: Secondary | ICD-10-CM

## 2014-10-24 DIAGNOSIS — F329 Major depressive disorder, single episode, unspecified: Secondary | ICD-10-CM | POA: Diagnosis not present

## 2014-10-24 DIAGNOSIS — E119 Type 2 diabetes mellitus without complications: Secondary | ICD-10-CM | POA: Diagnosis not present

## 2014-10-24 DIAGNOSIS — G4700 Insomnia, unspecified: Secondary | ICD-10-CM | POA: Diagnosis not present

## 2014-10-24 DIAGNOSIS — F32A Depression, unspecified: Secondary | ICD-10-CM

## 2014-10-24 DIAGNOSIS — R600 Localized edema: Secondary | ICD-10-CM | POA: Diagnosis not present

## 2014-10-24 LAB — CBC
HCT: 37.1 % (ref 36.0–46.0)
Hemoglobin: 12.3 g/dL (ref 12.0–15.0)
MCH: 28.3 pg (ref 26.0–34.0)
MCHC: 33.2 g/dL (ref 30.0–36.0)
MCV: 85.3 fL (ref 78.0–100.0)
MPV: 9.6 fL (ref 8.6–12.4)
Platelets: 255 10*3/uL (ref 150–400)
RBC: 4.35 MIL/uL (ref 3.87–5.11)
RDW: 14.4 % (ref 11.5–15.5)
WBC: 10.3 10*3/uL (ref 4.0–10.5)

## 2014-10-24 LAB — POCT GLYCOSYLATED HEMOGLOBIN (HGB A1C): Hemoglobin A1C: 6.7

## 2014-10-24 MED ORDER — ALPRAZOLAM 1 MG PO TABS: 1.0000 mg | ORAL_TABLET | Freq: Every evening | ORAL | Status: DC | PRN

## 2014-10-24 NOTE — Progress Notes (Addendum)
[facility] [address], [place] [address] [phone]  Date:  10/24/2014   Name:  Jodi Williams   DOB:  04-Apr-1951   MRN:  [id number]  PCP:  Lamar Blinks, MD    Chief Complaint: Leg Cramps and Medication Refill   History of Present Illness:  Jodi Williams is a 64 y.o. very pleasant female patient who presents with the following:  Here today with concern of feeling weak and tired. Back in March/ April she was ill and got dehydrated, had persistent diarrhea.  She finally recovered from this illness and did well until recently.   This past Saturday she was sitting on the beach- she turned her head to the side and had suddent onset of a spinning sensation.  She had not been drinking any alcohol.  She then went for a walk and noted that when she would look down for shells she would have the sensation again.  This seems to be better now.  No tinnitus or hearing changes  When she first had the sensation she also noticed that her feet were tingly and "swollen," this is better now.  She has noted some leg cramps recenttly as well.  She also notes difficulty with sleep, "that klonopin is not helping me sleep at all"   She had been on xanax in the past- we had changed to klonopin for sleep but this is not doing as well recenlty.  She would like to go back to xanax.    She is taking 40 mg of celexa.  She has been on this for about one month.  Had been on wellbutrin as well but has been of this for a couple of months; in the meantime she increased her celexa to 40 She does have DM but does not check her glucose at home  Lab Results  Component Value Date   HGBA1C 6.3 05/16/2014     Patient Active Problem List   Diagnosis Date Noted  . Diabetes 08/24/2013  . Other and unspecified hyperlipidemia 09/07/2012  . HTN (hypertension) 04/12/2012  . OA (osteoarthritis) of knee 04/12/2012  . 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 Depression with anxiety 04/12/2012  . H/O  gastric ulcer 04/12/2012  . ALLERGIC RHINITIS WITH CONJUNCTIVITIS 09/08/2007  . ASTHMA 07/20/2007    Past Medical History  Diagnosis Date  . Depression   . Anxiety   . Allergy   . Arthritis   . Cataract   . Ulcer   . Hyperlipidemia   . Hypertension   . Asthma     Past Surgical History  Procedure Laterality Date  . Breast surgery    . Back surgery    . Hand surgery    . Eye surgery      cataract  . Knee surgery      History  Substance Use Topics  . Smoking status: Never Smoker   . Smokeless tobacco: Never Used  . Alcohol Use: No    Family History  Problem Relation Age of Onset  . Hyperlipidemia Mother   . Hypertension Mother   . Heart disease Mother   . Dementia Mother   . Arthritis Mother   . Neuropathy Father   . Diabetes Father   . Kidney disease Father     Allergies  Allergen Reactions  . Omnicef [Cefdinir] Rash    Medication list has been reviewed and updated.  Current Outpatient Prescriptions on File Prior to Visit  Medication Sig Dispense Refill  .  acetaminophen (TYLENOL) 650 MG CR tablet Take 650 mg by mouth every 8 (eight) hours as needed for pain (pain).     Marland Kitchen albuterol (PROVENTIL HFA;VENTOLIN HFA) 108 (90 BASE) MCG/ACT inhaler Inhale 2 puffs into the lungs every 4 (four) hours as needed for wheezing or shortness of breath (cough, shortness of breath or wheezing.). 1 Inhaler 1  . aspirin 81 MG tablet Take 81 mg by mouth daily.    Marland Kitchen buPROPion (WELLBUTRIN XL) 300 MG 24 hr tablet TAKE 1 TABLET BY MOUTH EVERY DAY (Patient not taking: Reported on 10/24/2014) 30 tablet 9  . citalopram (CELEXA) 20 MG tablet TAKE 2 TABLETS BY MOUTH EVERY DAY (Patient taking differently: Take 20 mg by mouth daily. ) 60 tablet 9  . clonazePAM (KLONOPIN) 2 MG tablet TAKE 1/2 TO 1 TABLET BY MOUTH EVERY NIGHT AT BEDTIME AS NEEDED FOR SLEEP (Patient not taking: Reported on 10/24/2014) 30 tablet 1  . dicyclomine (BENTYL) 10 MG capsule Take 1 or 2 pills before meals and at bedtime  for diarrhea (Patient not taking: Reported on 10/24/2014) 40 capsule 1  . guaifenesin (ROBITUSSIN) 100 MG/5ML syrup Take 200 mg by mouth 3 (three) times daily as needed for cough (cough).    Marland Kitchen levofloxacin (LEVAQUIN) 500 MG tablet Take 1 tablet (500 mg total) by mouth daily. (Patient not taking: Reported on 10/24/2014) 10 tablet 0  . lisinopril-hydrochlorothiazide (PRINZIDE,ZESTORETIC) 10-12.5 MG per tablet Take 1 tablet by mouth daily. 30 tablet 9  . meloxicam (MOBIC) 7.5 MG tablet Take 1 tablet (7.5 mg total) by mouth daily. 30 tablet 9  . metFORMIN (GLUCOPHAGE) 500 MG tablet TAKE 1 TABLET AT BEDTIME FOR 1 WEEK THEN TAKE 1 TABLET TWICE A DAY WITH A MEAL THEREAFTER 180 tablet 0  . Omega-3 Fatty Acids (FISH OIL) 1000 MG CAPS Take 2 capsules by mouth daily.     Marland Kitchen omeprazole (PRILOSEC) 20 MG capsule Take 20 mg by mouth daily.    . ondansetron (ZOFRAN) 4 MG tablet Take 1 tablet (4 mg total) by mouth every 8 (eight) hours as needed for nausea or vomiting. (Patient not taking: Reported on 10/24/2014) 12 tablet 0  . pramipexole (MIRAPEX) 0.125 MG tablet Take 1 tablet (0.125 mg total) by mouth daily. 30 tablet 9  . pravastatin (PRAVACHOL) 20 MG tablet Take 1 tablet (20 mg total) by mouth daily. May take every 2nd or 3rd day if aches 90 tablet 3  . Red Yeast Rice Extract (RED YEAST RICE PO) Take 1 tablet by mouth daily.     No current facility-administered medications on file prior to visit.    Review of Systems:  As per HPI- otherwise negative.   Physical Examination: Filed Vitals:   10/24/14 1310  BP: 122/70  Pulse: 88  Temp: 97.7 F (36.5 C)  Resp: 17   Filed Vitals:   10/24/14 1310  Height: 5\' 2"  (1.575 m)  Weight: 178 lb 9.6 oz (81.012 kg)   Body mass index is 32.66 kg/(m^2). Ideal Body Weight: Weight in (lb) to have BMI = 25: 136.4  GEN: WDWN, NAD, Non-toxic, A & O x 3, obese, looks well.  Has been getting a lot of sun this summer HEENT: Atraumatic, Normocephalic. Neck supple. No  masses, No LAD.  Bilateral TM wnl, oropharynx normal.  PEERL,EOMI.   Ears and Nose: No external deformity. CV: RRR, No M/G/R. No JVD. No thrill. No extra heart sounds. PULM: CTA B, no wheezes, crackles, rhonchi. No retractions. No resp. distress. No accessory muscle  use. EXTR: No c/c/e NEURO Normal gait. Normal strength, sensation and DTR all extremities, normal romberg PSYCH: Normally interactive. Conversant. Not depressed or anxious appearing.  Calm demeanor.   Wt Readings from Last 3 Encounters:  10/24/14 178 lb 9.6 oz (81.012 kg)  08/31/14 171 lb (77.565 kg)  08/30/14 171 lb (77.565 kg)     Results for orders placed or performed in visit on 10/24/14  POCT glycosylated hemoglobin (Hb A1C)  Result Value Ref Range   Hemoglobin A1C 6.7      Assessment and Plan: Diabetes mellitus type 2, controlled - Plan: CBC, POCT glycosylated hemoglobin (Hb A1C), Comprehensive metabolic panel, Microalbumin, urine  Benign paroxysmal positional vertigo, unspecified laterality  Pedal edema  Depression  Insomnia - Plan: ALPRAZolam (XANAX) 1 MG tablet  Reassured that her glucose control is reasonable.  Encouraged her to exercise and work towards an A1c goal of 6.5% Will try xanax instead of klonopin for her insomnia; she was on 1mg  in the past.  She will try a half tablet first She has no edema at this time; check labs She is doing ok she thinks without wellbutrin right now.  Hopefully her mood will improve more with better sleep She will let me know if she wants to go back on wellbutrin as well  Signed Lamar Blinks, MD  Called to check on her with labs on 6/16: she reports an incident of vertigo yesterday, today feeling well.   Labs look good.  She will let me know if vertigo does not continue to ease off Copy of labs to pt

## 2014-10-24 NOTE — Patient Instructions (Signed)
Your diabetes control is ok- I do not think that your sugar is getting overly high.   I will be in touch with the rest of your labs.   Use the xanax as needed for sleep.   Let me know if your mood is not getting back to normal over the next couple of weeks- I can add back wellbutrin if needed  Try to get some more exercise as well  Please see me in about 3 months for a rechecK  I think the vertigo you had was "BPPV-" let me know if it comes back   Benign Positional Vertigo Vertigo means you feel like you or your surroundings are moving when they are not. Benign positional vertigo is the most common form of vertigo. Benign means that the cause of your condition is not serious. Benign positional vertigo is more common in older adults. CAUSES  Benign positional vertigo is the result of an upset in the labyrinth system. This is an area in the middle ear that helps control your balance. This may be caused by a viral infection, head injury, or repetitive motion. However, often no specific cause is found. SYMPTOMS  Symptoms of benign positional vertigo occur when you move your head or eyes in different directions. Some of the symptoms may include:  Loss of balance and falls.  Vomiting.  Blurred vision.  Dizziness.  Nausea.  Involuntary eye movements (nystagmus). DIAGNOSIS  Benign positional vertigo is usually diagnosed by physical exam. If the specific cause of your benign positional vertigo is unknown, your caregiver may perform imaging tests, such as magnetic resonance imaging (MRI) or computed tomography (CT). TREATMENT  Your caregiver may recommend movements or procedures to correct the benign positional vertigo. Medicines such as meclizine, benzodiazepines, and medicines for nausea may be used to treat your symptoms. In rare cases, if your symptoms are caused by certain conditions that affect the inner ear, you may need surgery. HOME CARE INSTRUCTIONS   Follow your caregiver's  instructions.  Move slowly. Do not make sudden body or head movements.  Avoid driving.  Avoid operating heavy machinery.  Avoid performing any tasks that would be dangerous to you or others during a vertigo episode.  Drink enough fluids to keep your urine clear or pale yellow. SEEK IMMEDIATE MEDICAL CARE IF:   You develop problems with walking, weakness, numbness, or using your arms, hands, or legs.  You have difficulty speaking.  You develop severe headaches.  Your nausea or vomiting continues or gets worse.  You develop visual changes.  Your family or friends notice any behavioral changes.  Your condition gets worse.  You have a fever.  You develop a stiff neck or sensitivity to light. MAKE SURE YOU:   Understand these instructions.  Will watch your condition.  Will get help right away if you are not doing well or get worse. Document Released: 02/04/2006 Document Revised: 07/22/2011 Document Reviewed: 01/17/2011 [facility] Patient Information 2015 [place], [place]. This information is not intended to replace advice given to you by your health care provider. Make sure you discuss any questions you have with your health care provider.

## 2014-10-25 ENCOUNTER — Telehealth: Payer: Self-pay

## 2014-10-25 LAB — MICROALBUMIN, URINE: MICROALB UR: 6.4 mg/dL — AB (ref <2.0)

## 2014-10-25 NOTE — Telephone Encounter (Signed)
Dr Lorelei Pont, I somehow managed to lose this pt's SST from yesterday. I apologize sincerely. Do you want me to have her RTC for a re draw?

## 2014-10-26 ENCOUNTER — Other Ambulatory Visit: Payer: 59

## 2014-10-26 DIAGNOSIS — H8110 Benign paroxysmal vertigo, unspecified ear: Secondary | ICD-10-CM | POA: Diagnosis not present

## 2014-10-26 DIAGNOSIS — E119 Type 2 diabetes mellitus without complications: Secondary | ICD-10-CM | POA: Diagnosis not present

## 2014-10-26 DIAGNOSIS — R600 Localized edema: Secondary | ICD-10-CM | POA: Diagnosis not present

## 2014-10-26 LAB — COMPREHENSIVE METABOLIC PANEL
ALBUMIN: 4 g/dL (ref 3.5–5.2)
ALT: 19 U/L (ref 0–35)
AST: 14 U/L (ref 0–37)
Alkaline Phosphatase: 74 U/L (ref 39–117)
BUN: 23 mg/dL (ref 6–23)
CO2: 24 mEq/L (ref 19–32)
Calcium: 9.5 mg/dL (ref 8.4–10.5)
Chloride: 103 mEq/L (ref 96–112)
Creat: 0.94 mg/dL (ref 0.50–1.10)
GLUCOSE: 99 mg/dL (ref 70–99)
Potassium: 4.3 mEq/L (ref 3.5–5.3)
SODIUM: 137 meq/L (ref 135–145)
Total Bilirubin: 0.3 mg/dL (ref 0.2–1.2)
Total Protein: 6.8 g/dL (ref 6.0–8.3)

## 2014-10-26 NOTE — Telephone Encounter (Signed)
Jodi Williams

## 2014-10-26 NOTE — Telephone Encounter (Signed)
Yes, I think we should because I do want to go ahead with her CMP.  Please ask her to come in for a redraw at her convenience with my apologies. Thank you!

## 2014-10-26 NOTE — Progress Notes (Signed)
Np charge lab draw due to mistake on lab

## 2014-10-26 NOTE — Telephone Encounter (Signed)
Pt notified. Will RTC today

## 2014-10-27 ENCOUNTER — Encounter: Payer: Self-pay | Admitting: Family Medicine

## 2014-11-11 ENCOUNTER — Other Ambulatory Visit: Payer: Self-pay | Admitting: Family Medicine

## 2014-12-26 ENCOUNTER — Other Ambulatory Visit: Payer: Self-pay | Admitting: Family Medicine

## 2014-12-30 ENCOUNTER — Other Ambulatory Visit: Payer: Self-pay | Admitting: Family Medicine

## 2015-01-02 NOTE — Telephone Encounter (Signed)
Patient is calling because the xanax was wasn't sent at the same time as the mirapex. She states she is completely out. [phone]

## 2015-01-03 NOTE — Telephone Encounter (Signed)
Called in alprazolam that was approved and notified pt on VM done.

## 2015-01-18 ENCOUNTER — Encounter: Payer: Self-pay | Admitting: Family Medicine

## 2015-01-18 ENCOUNTER — Ambulatory Visit (INDEPENDENT_AMBULATORY_CARE_PROVIDER_SITE_OTHER): Payer: 59 | Admitting: Family Medicine

## 2015-01-18 VITALS — BP 113/74 | HR 66 | Temp 98.8°F | Resp 16 | Ht 62.0 in | Wt 169.0 lb

## 2015-01-18 DIAGNOSIS — D229 Melanocytic nevi, unspecified: Secondary | ICD-10-CM | POA: Diagnosis not present

## 2015-01-18 DIAGNOSIS — E785 Hyperlipidemia, unspecified: Secondary | ICD-10-CM

## 2015-01-18 DIAGNOSIS — Z23 Encounter for immunization: Secondary | ICD-10-CM | POA: Diagnosis not present

## 2015-01-18 DIAGNOSIS — Z119 Encounter for screening for infectious and parasitic diseases, unspecified: Secondary | ICD-10-CM

## 2015-01-18 DIAGNOSIS — E119 Type 2 diabetes mellitus without complications: Secondary | ICD-10-CM | POA: Diagnosis not present

## 2015-01-18 DIAGNOSIS — G2581 Restless legs syndrome: Secondary | ICD-10-CM

## 2015-01-18 LAB — LIPID PANEL
CHOL/HDL RATIO: 4.9 ratio (ref <=5.0)
CHOLESTEROL: 186 mg/dL (ref 125–200)
HDL: 38 mg/dL — ABNORMAL LOW (ref >=46)
LDL Cholesterol: 103 mg/dL (ref <130)
TRIGLYCERIDES: 227 mg/dL — AB (ref <150)
VLDL: 45 mg/dL — ABNORMAL HIGH (ref <30)

## 2015-01-18 LAB — HIV ANTIBODY (ROUTINE TESTING W REFLEX): HIV: NONREACTIVE

## 2015-01-18 LAB — HEPATITIS C ANTIBODY: HCV AB: NEGATIVE

## 2015-01-18 MED ORDER — PRAMIPEXOLE DIHYDROCHLORIDE 0.25 MG PO TABS: ORAL_TABLET | ORAL | Status: DC

## 2015-01-18 NOTE — Progress Notes (Signed)
[facility] [address] [place] [address] [phone]  Date:  01/18/2015   Name:  Jodi Williams   DOB:  [birth year]   MRN:  [id number]  PCP:  Lamar Blinks, MD    Chief Complaint: restless leg; watery eye; and Nevus   History of Present Illness:  Jodi Williams is a 64 y.o. very pleasant female patient who presents with the following:  Here today to follow-up a few concerns history of DM, HTN, OA, obesity, depression  She has noted watering of just the right eye for "month."  She has been taking a generic claritin and benadryl in the am She is not using any eye drops- she tried some drops but they seemed to make her worse She uses OTC readers- no contacts  She did have cataract surgery.   She does not notice any difference between her 2 eyes as far as vision No photophobia No am crusting- just watering  She has been working on her diet, has been avoiding sweets. They were just on vacation so she gained back a little bit of what she had lost but she is still down about 10 lbs She is fasting today  She has not yet had her shingles vaccine.   She notes a mole on her belly- it seems to have changed some. She has not had a skin check in some time and does enjoy the sun  She notes a lot of pains in her legs at night- uses mirapex at 0.125 for RLS.  Would be interested in increasing her dose   Wt Readings from Last 3 Encounters:  01/18/15 169 lb (76.658 kg)  10/24/14 178 lb 9.6 oz (81.012 kg)  08/31/14 171 lb (77.565 kg)     Lab Results  Component Value Date   HGBA1C 6.7 10/24/2014     Patient Active Problem List   Diagnosis Date Noted  . Diabetes 08/24/2013  . Other and unspecified hyperlipidemia 09/07/2012  . HTN (hypertension) 04/12/2012  . OA (osteoarthritis) of knee 04/12/2012  . 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 Depression with anxiety 04/12/2012  . H/O gastric ulcer 04/12/2012  . ALLERGIC RHINITIS WITH CONJUNCTIVITIS 09/08/2007  .  ASTHMA 07/20/2007    Past Medical History  Diagnosis Date  . Depression   . Anxiety   . Allergy   . Arthritis   . Cataract   . Ulcer   . Hyperlipidemia   . Hypertension   . Asthma     Past Surgical History  Procedure Laterality Date  . Breast surgery    . Back surgery    . Hand surgery    . Eye surgery      cataract  . Knee surgery      Social History  Substance Use Topics  . Smoking status: Never Smoker   . Smokeless tobacco: Never Used  . Alcohol Use: No    Family History  Problem Relation Age of Onset  . Hyperlipidemia Mother   . Hypertension Mother   . Heart disease Mother   . Dementia Mother   . Arthritis Mother   . Neuropathy Father   . Diabetes Father   . Kidney disease Father     Allergies  Allergen Reactions  . Omnicef [Cefdinir] Rash    Medication list has been reviewed and updated.  Current Outpatient Prescriptions on File Prior to Visit  Medication Sig Dispense Refill  . acetaminophen (TYLENOL) 650 MG CR tablet Take 650 mg by mouth  every 8 (eight) hours as needed for pain (pain).     Marland Kitchen albuterol (PROVENTIL HFA;VENTOLIN HFA) 108 (90 BASE) MCG/ACT inhaler Inhale 2 puffs into the lungs every 4 (four) hours as needed for wheezing or shortness of breath (cough, shortness of breath or wheezing.). 1 Inhaler 1  . ALPRAZolam (XANAX) 1 MG tablet TAKE 1 TABLET BY MOUTH AT BEDTIME AS NEEDED FOR SLEEP 30 tablet 0  . aspirin 81 MG tablet Take 81 mg by mouth daily.    Marland Kitchen buPROPion (WELLBUTRIN XL) 300 MG 24 hr tablet TAKE 1 TABLET BY MOUTH EVERY DAY (Patient not taking: Reported on 10/24/2014) 30 tablet 9  . citalopram (CELEXA) 20 MG tablet TAKE 2 TABLETS BY MOUTH EVERY DAY (Patient taking differently: Take 20 mg by mouth daily. ) 60 tablet 9  . lisinopril-hydrochlorothiazide (PRINZIDE,ZESTORETIC) 10-12.5 MG per tablet TAKE 1 TABLET BY MOUTH DAILY 30 tablet 5  . meloxicam (MOBIC) 7.5 MG tablet Take 1 tablet (7.5 mg total) by mouth daily. 30 tablet 9  .  metFORMIN (GLUCOPHAGE) 500 MG tablet Take 1 tablet (500 mg total) by mouth 2 (two) times daily with a meal. 60 tablet 0  . Omega-3 Fatty Acids (FISH OIL) 1000 MG CAPS Take 2 capsules by mouth daily.     Marland Kitchen omeprazole (PRILOSEC) 20 MG capsule Take 20 mg by mouth daily.    . pramipexole (MIRAPEX) 0.125 MG tablet TAKE 1 TABLET BY MOUTH DAILY.  'OV NEEDED" 30 tablet 0  . pravastatin (PRAVACHOL) 20 MG tablet Take 1 tablet (20 mg total) by mouth daily. May take every 2nd or 3rd day if aches 90 tablet 3   No current facility-administered medications on file prior to visit.    Review of Systems:  As per HPI- otherwise negative.   Physical Examination: Filed Vitals:   01/18/15 0823  BP: 113/74  Pulse: 66  Temp: 98.8 F (37.1 C)  Resp: 16   Filed Vitals:   01/18/15 0823  Height: 5\' 2"  (1.575 m)  Weight: 169 lb (76.658 kg)   Body mass index is 30.9 kg/(m^2). Ideal Body Weight: Weight in (lb) to have BMI = 25: 136.4  GEN: WDWN, NAD, Non-toxic, A & O x 3, overweight  But has lost HEENT: Atraumatic, Normocephalic. Neck supple. No masses, No LAD.  Bilateral TM wnl, oropharynx normal.  PEERL,EOMI.   Eyes appear quite healthy, not currently watering,  Limited fundoscopic exam shows large floater in right eye (known)- OW normal Ears and Nose: No external deformity. CV: RRR, No M/G/R. No JVD. No thrill. No extra heart sounds. PULM: CTA B, no wheezes, crackles, rhonchi. No retractions. No resp. distress. No accessory muscle use. EXTR: No c/c/e NEURO Normal gait.  PSYCH: Normally interactive. Conversant. Not depressed or anxious appearing.  Calm demeanor.  Benign appearing vascularity on her abdomen   Assessment and Plan: Diabetes mellitus type 2, controlled - Plan: Hemoglobin A1c, CANCELED: Comprehensive metabolic panel  Need for prophylactic vaccination and inoculation against influenza - Plan: Flu Vaccine QUAD 36+ mos IM  Nevus of multiple sites - Plan: Ambulatory referral to  Dermatology  Screening examination for infectious disease - Plan: HIV antibody, Hepatitis C antibody  Dyslipidemia - Plan: Lipid panel  RLS (restless legs syndrome) - Plan: pramipexole (MIRAPEX) 0.25 MG tablet  Increased mirapex Flu shot Referral to derm Great job on weight loss! Will plan further follow- up pending labs.   Signed Lamar Blinks, MD

## 2015-01-18 NOTE — Patient Instructions (Signed)
Try increasing your mirapex for restless legs to 0.25 mg at bedtime I will be in touch with your labs- great job with weight loss!  Please see your eye doctor about your eye watering- I wonder if you may have a tear duct problem of some sort  Let's follow- up in 4 months.

## 2015-01-19 ENCOUNTER — Encounter: Payer: Self-pay | Admitting: Family Medicine

## 2015-01-19 LAB — HEMOGLOBIN A1C
Hgb A1c MFr Bld: 6.5 % — ABNORMAL HIGH (ref <5.7)
MEAN PLASMA GLUCOSE: 140 mg/dL — AB (ref <117)

## 2015-01-25 ENCOUNTER — Other Ambulatory Visit: Payer: Self-pay | Admitting: Family Medicine

## 2015-01-30 ENCOUNTER — Other Ambulatory Visit: Payer: Self-pay | Admitting: Family Medicine

## 2015-02-02 ENCOUNTER — Other Ambulatory Visit: Payer: Self-pay | Admitting: Family Medicine

## 2015-02-16 LAB — HM DIABETES EYE EXAM

## 2015-03-01 ENCOUNTER — Other Ambulatory Visit: Payer: Self-pay | Admitting: Physician Assistant

## 2015-03-06 ENCOUNTER — Encounter: Payer: Self-pay | Admitting: Family Medicine

## 2015-03-18 NOTE — Telephone Encounter (Signed)
error 

## 2015-04-02 ENCOUNTER — Other Ambulatory Visit: Payer: Self-pay | Admitting: Family Medicine

## 2015-04-02 DIAGNOSIS — G4700 Insomnia, unspecified: Secondary | ICD-10-CM

## 2015-04-12 ENCOUNTER — Ambulatory Visit (INDEPENDENT_AMBULATORY_CARE_PROVIDER_SITE_OTHER): Payer: 59 | Admitting: Emergency Medicine

## 2015-04-12 ENCOUNTER — Ambulatory Visit (INDEPENDENT_AMBULATORY_CARE_PROVIDER_SITE_OTHER): Payer: 59

## 2015-04-12 VITALS — BP 128/86 | HR 76 | Temp 98.8°F | Resp 18 | Ht 62.0 in | Wt 169.2 lb

## 2015-04-12 DIAGNOSIS — R059 Cough, unspecified: Secondary | ICD-10-CM

## 2015-04-12 DIAGNOSIS — R05 Cough: Secondary | ICD-10-CM

## 2015-04-12 DIAGNOSIS — Z0000 Encounter for general adult medical examination without abnormal findings: Secondary | ICD-10-CM | POA: Diagnosis not present

## 2015-04-12 NOTE — Progress Notes (Addendum)
Patient ID: Jodi Williams, female   DOB: January 28, 1951, 64 y.o.   MRN: [id number]     This chart was scribed for Jodi Queen, MD by Zola Button, Medical Scribe. This patient was seen in room 10 and the patient's care was started at 8:41 AM.   Chief Complaint:  Chief Complaint  Patient presents with  . Annual Exam    without pelvic exam    HPI: Jodi Williams is a 64 y.o. female with a history of HTN and DM who reports to [facility] today for an annual exam for work. Patient is UTD on mammogram and eye exam. She believes her last colonoscopy was within the past 10 years. She is followed by Lesia Hausen, FNP for her well woman care. She does have a mole on her abdomen, which is followed by dermatology. She did receive the flu shot this year. Her PSHx includes cataracts surgery in both eyes and left knee replacement. She states her right knee will also need to be replaced. Her orthopedist is Dr. Theda Sers, who she will be seeing on 12/8. Patient denies history of smoking, but notes that both of her parents smoked.  Patient notes that she had been ill for the past month. She has mostly recovered, but still has a cough at night when laying down. She does have heartburn at times.  PCP: Dr. Lorelei Pont; she has her next appointment with her on 12/12. She plans to have her blood drawn at that time.  Past Medical History  Diagnosis Date  . Depression   . Anxiety   . Allergy   . Arthritis   . Cataract   . Ulcer   . Hyperlipidemia   . Hypertension   . Asthma    Past Surgical History  Procedure Laterality Date  . Breast surgery    . Back surgery    . Hand surgery    . Eye surgery      cataract  . Knee surgery     Social History   Social History  . Marital Status: Married    Spouse Name: N/A  . Number of Children: N/A  . Years of Education: N/A   Occupational History  . House cleaner    Social History Main Topics  . Smoking status: Never Smoker   . Smokeless tobacco: Never Used  . Alcohol Use: No    . Drug Use: No  . Sexual Activity: Yes    Birth Control/ Protection: None   Other Topics Concern  . None   Social History Narrative   Family History  Problem Relation Age of Onset  . Hyperlipidemia Mother   . Hypertension Mother   . Heart disease Mother   . Dementia Mother   . Arthritis Mother   . Neuropathy Father   . Diabetes Father   . Kidney disease Father    Allergies  Allergen Reactions  . Omnicef [Cefdinir] Rash   Prior to Admission medications   Medication Sig Start Date End Date Taking? Authorizing Provider  albuterol (PROVENTIL HFA;VENTOLIN HFA) 108 (90 BASE) MCG/ACT inhaler Inhale 2 puffs into the lungs every 4 (four) hours as needed for wheezing or shortness of breath (cough, shortness of breath or wheezing.). 08/14/14  Yes Roselee Culver, MD  ALPRAZolam Duanne Moron) 1 MG tablet TAKE 1 TABLET BY MOUTH EVERY NIGHT AT BEDTIME AS NEEDED FOR SLEEP 04/03/15  Yes Gay Filler Copland, MD  aspirin 81 MG tablet Take 81 mg by mouth daily.   Yes Historical Provider,  MD  citalopram (CELEXA) 20 MG tablet TAKE 2 TABLETS BY MOUTH EVERY DAY 03/01/15  Yes Gay Filler Copland, MD  lisinopril-hydrochlorothiazide (PRINZIDE,ZESTORETIC) 10-12.5 MG per tablet TAKE 1 TABLET BY MOUTH DAILY 11/14/14  Yes Gay Filler Copland, MD  meloxicam (MOBIC) 7.5 MG tablet TAKE 1 TABLET BY MOUTH DAILY 01/25/15  Yes Jessica C Copland, MD  metFORMIN (GLUCOPHAGE) 500 MG tablet TAKE 1 TABLET(500 MG) BY MOUTH TWICE DAILY WITH A MEAL 01/25/15  Yes Jessica C Copland, MD  omeprazole (PRILOSEC) 20 MG capsule Take 20 mg by mouth daily.   Yes Historical Provider, MD  pramipexole (MIRAPEX) 0.25 MG tablet TAKE 1 TABLET BY MOUTH DAILY. 01/18/15  Yes Gay Filler Copland, MD  pravastatin (PRAVACHOL) 20 MG tablet TAKE 1 TABLET BY MOUTH DAILY. MAY TAKE EVERY 2ND OR 3RD DAY IF ACHES 02/03/15  Yes Gay Filler Copland, MD     ROS: The patient denies fevers, chills, night sweats, unintentional weight loss, chest pain, palpitations, wheezing,  dyspnea on exertion, nausea, vomiting, abdominal pain, dysuria, hematuria, melena, numbness, weakness, or tingling.   All other systems have been reviewed and were otherwise negative with the exception of those mentioned in the HPI and as above.    PHYSICAL EXAM: Filed Vitals:   04/12/15 0816  BP: 128/86  Pulse: 76  Temp: 98.8 F (37.1 C)  Resp: 18   Body mass index is 30.94 kg/(m^2).   General: Alert, no acute distress HEENT:  Normocephalic, atraumatic, oropharynx patent. Eye: Juliette Mangle [facility] Cardiovascular:  Regular rate and rhythm, no rubs murmurs or gallops.  No Carotid bruits, radial pulse intact. No pedal edema.  Respiratory: Clear to auscultation bilaterally.  No wheezes, rales, or rhonchi.  No cyanosis, no use of accessory musculature. Mild hoarseness to her voice. Abdominal: No organomegaly, abdomen is soft and non-tender, positive bowel sounds.  No masses. Musculoskeletal: Gait intact. No edema, tenderness Skin: Flat, 4 mm x 5 mm pigmented area in right lower abdomen, recently checked by her dermatologist. Neurologic: Facial musculature symmetric. Psychiatric: Patient acts appropriately throughout our interaction. Lymphatic: No cervical or submandibular lymphadenopathy     LABS:    EKG/XRAY:   Primary read interpreted by Dr. Everlene Farrier at [facility]. NAD  ASSESSMENT/PLAN: . Patient has an upcoming physical with Dr. Lorelei Pont 04/24/2015. I did not do blood work today are up-to-date immunizations. I signed her form for work. I did perform a chest x-ray which showed no acute disease. Patient had to leave so I did not discuss this with her. By signing my name below, I, Zola Button, attest that this documentation has been prepared under the direction and in the presence of Jodi Queen, MD.  Electronically Signed: Zola Button, Medical Scribe. 04/12/2015. 8:41 AM.   [name] sideeffects, risk and benefits, and alternatives of medications d/w patient. Patient is aware that all medications  have potential sideeffects and we are unable to predict every sideeffect or drug-drug interaction that may occur.  Jodi Queen MD 04/12/2015 8:41 AM

## 2015-04-22 ENCOUNTER — Other Ambulatory Visit: Payer: Self-pay | Admitting: Family Medicine

## 2015-04-24 NOTE — Telephone Encounter (Signed)
Jodi Williams called in to get her latest A1c result. She also wanted to cancel her appt for 04/27/15.

## 2015-04-26 ENCOUNTER — Encounter: Payer: Self-pay | Admitting: Family Medicine

## 2015-05-09 ENCOUNTER — Other Ambulatory Visit: Payer: Self-pay | Admitting: Orthopedic Surgery

## 2015-05-09 DIAGNOSIS — M25562 Pain in left knee: Secondary | ICD-10-CM

## 2015-05-16 ENCOUNTER — Telehealth: Payer: Self-pay | Admitting: Family Medicine

## 2015-05-16 ENCOUNTER — Ambulatory Visit (INDEPENDENT_AMBULATORY_CARE_PROVIDER_SITE_OTHER): Payer: 59 | Admitting: Physician Assistant

## 2015-05-16 VITALS — BP 142/74 | HR 93 | Temp 98.1°F | Resp 18 | Ht 62.0 in | Wt 180.0 lb

## 2015-05-16 DIAGNOSIS — J209 Acute bronchitis, unspecified: Secondary | ICD-10-CM | POA: Diagnosis not present

## 2015-05-16 MED ORDER — HYDROCOD POLST-CPM POLST ER 10-8 MG/5ML PO SUER: 5.0000 mL | Freq: Two times a day (BID) | ORAL | Status: DC | PRN

## 2015-05-16 MED ORDER — AZITHROMYCIN 250 MG PO TABS: ORAL_TABLET | ORAL | Status: AC

## 2015-05-16 NOTE — Patient Instructions (Signed)
zpak as directed. Drink plenty of water (64 oz/day) and get plenty of rest. If you have been prescribed a cough syrup, do not drive or operate heavy machinery while using this medication. If you have been prescribed a nasal spray, use twice a day. Continue albuterol as needed. If your symptoms are not improving in 1 week, return to clinic.

## 2015-05-16 NOTE — Progress Notes (Signed)
[facility] [address] [place] [address] [phone]  Date:  05/16/2015   Name:  Jodi Williams   DOB:  04-24-1951   MRN:  [id number]  PCP:  Lamar Blinks, MD    Chief Complaint: Nasal Congestion; Ear Pain; and Cough   History of Present Illness:  This is a 65 y.o. female with PMH DM2, HLD, HTN, OA, depression/anxiety, GERD, asthma, allergic rhinitis who is presenting with 1 week of nasal congestion.  Cough: dry SOB/wheezing: sob + wheezing. Using albuterol inhaler once a day. Doesn't use inhaler at baseline, usually only triggered by URIs Nasal congestion: yes Otalgia: bilateral Sore throat: yes Fever/chills: no Aggravating/alleviating factors: OTC sinus pills. Took 3 doses 500 mg amoxicillin (something her husband gets prior to dental procedures). History of asthma: yes History of env allergies: takes allegra daily Tobacco use: no Flu shot 01/2015  States she is prone to bronchitis. States 10 months ago she was sick for 6 weeks with bronchitis.   Review of Systems:  Review of Systems See HPI  Patient Active Problem List   Diagnosis Date Noted  . Diabetes ([place]) 08/24/2013  . Other and unspecified hyperlipidemia 09/07/2012  . HTN (hypertension) 04/12/2012  . OA (osteoarthritis) of knee 04/12/2012  . 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 Depression with anxiety 04/12/2012  . H/O gastric ulcer 04/12/2012  . ALLERGIC RHINITIS WITH CONJUNCTIVITIS 09/08/2007  . ASTHMA 07/20/2007    Prior to Admission medications   Medication Sig Start Date End Date Taking? Authorizing Provider  albuterol (PROVENTIL HFA;VENTOLIN HFA) 108 (90 BASE) MCG/ACT inhaler Inhale 2 puffs into the lungs every 4 (four) hours as needed for wheezing or shortness of breath (cough, shortness of breath or wheezing.). 08/14/14  Yes Roselee Culver, MD  ALPRAZolam Duanne Moron) 1 MG tablet TAKE 1 TABLET BY MOUTH EVERY NIGHT AT BEDTIME AS NEEDED FOR SLEEP 04/03/15  Yes Gay Filler Copland,  MD  aspirin 81 MG tablet Take 81 mg by mouth daily.   Yes Historical Provider, MD  citalopram (CELEXA) 20 MG tablet TAKE 2 TABLETS BY MOUTH EVERY DAY 03/01/15  Yes Gay Filler Copland, MD  lisinopril-hydrochlorothiazide (PRINZIDE,ZESTORETIC) 10-12.5 MG per tablet TAKE 1 TABLET BY MOUTH DAILY 11/14/14  Yes Gay Filler Copland, MD  meloxicam (MOBIC) 7.5 MG tablet TAKE 1 TABLET BY MOUTH DAILY 04/24/15  Yes Darlyne Russian, MD  metFORMIN (GLUCOPHAGE) 500 MG tablet TAKE 1 TABLET(500 MG) BY MOUTH TWICE DAILY WITH A MEAL 04/24/15  Yes Darlyne Russian, MD  omeprazole (PRILOSEC) 20 MG capsule Take 20 mg by mouth daily.   Yes Historical Provider, MD  pramipexole (MIRAPEX) 0.25 MG tablet TAKE 1 TABLET BY MOUTH DAILY. 01/18/15  Yes Gay Filler Copland, MD  pravastatin (PRAVACHOL) 20 MG tablet TAKE 1 TABLET BY MOUTH DAILY. MAY TAKE EVERY 2ND OR 3RD DAY IF ACHES 02/03/15  Yes Darreld Mclean, MD    Allergies  Allergen Reactions  . Omnicef [Cefdinir] Rash    Past Surgical History  Procedure Laterality Date  . Breast surgery    . Back surgery    . Hand surgery    . Eye surgery      cataract  . Knee surgery      Social History  Substance Use Topics  . Smoking status: Never Smoker   . Smokeless tobacco: Never Used  . Alcohol Use: No    Family History  Problem Relation Age of Onset  . Hyperlipidemia Mother   . Hypertension Mother   .  Heart disease Mother   . Dementia Mother   . Arthritis Mother   . Neuropathy Father   . Diabetes Father   . Kidney disease Father     Medication list has been reviewed and updated.  Physical Examination:  Physical Exam  Constitutional: She is oriented to person, place, and time. She appears well-developed and well-nourished. No distress.  HENT:  Head: Normocephalic and atraumatic.  Right Ear: Hearing, tympanic membrane, external ear and ear canal normal.  Left Ear: Hearing, tympanic membrane, external ear and ear canal normal.  Nose: Nose normal. Right sinus  exhibits no maxillary sinus tenderness and no frontal sinus tenderness. Left sinus exhibits no maxillary sinus tenderness and no frontal sinus tenderness.  Mouth/Throat: Uvula is midline, oropharynx is clear and moist and mucous membranes are normal.  Eyes: Conjunctivae and lids are normal. Right eye exhibits no discharge. Left eye exhibits no discharge. No scleral icterus.  Cardiovascular: Normal rate, regular rhythm, normal heart sounds and normal pulses.   No murmur heard. Pulmonary/Chest: Effort normal and breath sounds normal. No respiratory distress. She has no rhonchi. She has no rales.  Minimal wheezes. Overall, lung exam clear and moves air well  Musculoskeletal: Normal range of motion.  Lymphadenopathy:       Head (right side): No submental, no submandibular and no tonsillar adenopathy present.       Head (left side): No submental, no submandibular and no tonsillar adenopathy present.    She has no cervical adenopathy.  Neurological: She is alert and oriented to person, place, and time.  Skin: Skin is warm, dry and intact. No lesion and no rash noted.  Psychiatric: She has a normal mood and affect. Her speech is normal and behavior is normal. Thought content normal.   BP 142/74 mmHg  Pulse 93  Temp(Src) 98.1 F (36.7 C)  Resp 18  Ht 5\' 2"  (1.575 m)  Wt 180 lb (81.647 kg)  BMI 32.91 kg/m2  SpO2 97%  Assessment and Plan:  1. Acute bronchitis, unspecified organism Continue albuterol as needed. Meds below rx'd. She has atrovent nasal spray at home that she will use BID. Return if not getting better in 1 week. - azithromycin (ZITHROMAX) 250 MG tablet; Take 2 tabs PO x 1 dose, then 1 tab PO QD x 4 days  Dispense: 6 tablet; Refill: 0 - chlorpheniramine-HYDROcodone (TUSSIONEX PENNKINETIC ER) 10-8 MG/5ML SUER; Take 5 mLs by mouth every 12 (twelve) hours as needed for cough.  Dispense: 100 mL; Refill: 0   Benjaman Pott. Drenda Freeze, MHS [facility]  05/16/2015

## 2015-05-16 NOTE — Telephone Encounter (Signed)
Called pt and LMOM- please come and see me for pre-op clearance.  Dr. Ricki Rodriguez sent me a form to clear her for a knee replacement If she sees someone besides me the form is in my chart box

## 2015-05-18 ENCOUNTER — Ambulatory Visit
Admission: RE | Admit: 2015-05-18 | Discharge: 2015-05-18 | Disposition: A | Discharge status: Home / Self Care | Payer: 59 | Source: Ambulatory Visit | Attending: Orthopedic Surgery | Admitting: Orthopedic Surgery

## 2015-05-18 DIAGNOSIS — M25562 Pain in left knee: Secondary | ICD-10-CM

## 2015-05-24 ENCOUNTER — Encounter: Payer: Self-pay | Admitting: Family Medicine

## 2015-05-24 ENCOUNTER — Ambulatory Visit (INDEPENDENT_AMBULATORY_CARE_PROVIDER_SITE_OTHER): Payer: 59 | Admitting: Family Medicine

## 2015-05-24 ENCOUNTER — Ambulatory Visit (INDEPENDENT_AMBULATORY_CARE_PROVIDER_SITE_OTHER): Payer: 59

## 2015-05-24 VITALS — BP 138/84 | HR 65 | Temp 98.3°F | Resp 16 | Ht 61.75 in | Wt 180.6 lb

## 2015-05-24 DIAGNOSIS — R062 Wheezing: Secondary | ICD-10-CM | POA: Diagnosis not present

## 2015-05-24 DIAGNOSIS — R059 Cough, unspecified: Secondary | ICD-10-CM

## 2015-05-24 DIAGNOSIS — G2581 Restless legs syndrome: Secondary | ICD-10-CM | POA: Insufficient documentation

## 2015-05-24 DIAGNOSIS — Z01810 Encounter for preprocedural cardiovascular examination: Secondary | ICD-10-CM | POA: Diagnosis not present

## 2015-05-24 DIAGNOSIS — E871 Hypo-osmolality and hyponatremia: Secondary | ICD-10-CM | POA: Diagnosis not present

## 2015-05-24 DIAGNOSIS — R05 Cough: Secondary | ICD-10-CM

## 2015-05-24 DIAGNOSIS — J189 Pneumonia, unspecified organism: Secondary | ICD-10-CM | POA: Diagnosis not present

## 2015-05-24 DIAGNOSIS — E119 Type 2 diabetes mellitus without complications: Secondary | ICD-10-CM

## 2015-05-24 LAB — COMPREHENSIVE METABOLIC PANEL
ALT: 20 U/L (ref 6–29)
AST: 17 U/L (ref 10–35)
Albumin: 4 g/dL (ref 3.6–5.1)
Alkaline Phosphatase: 71 U/L (ref 33–130)
BUN: 16 mg/dL (ref 7–25)
CHLORIDE: 92 mmol/L — AB (ref 98–110)
CO2: 27 mmol/L (ref 20–31)
Calcium: 9 mg/dL (ref 8.6–10.4)
Creat: 1 mg/dL — ABNORMAL HIGH (ref 0.50–0.99)
GLUCOSE: 88 mg/dL (ref 65–99)
POTASSIUM: 4.6 mmol/L (ref 3.5–5.3)
Sodium: 128 mmol/L — ABNORMAL LOW (ref 135–146)
Total Bilirubin: 0.4 mg/dL (ref 0.2–1.2)
Total Protein: 6.5 g/dL (ref 6.1–8.1)

## 2015-05-24 LAB — POCT CBC
GRANULOCYTE PERCENT: 58.7 % (ref 37–80)
HEMATOCRIT: 33.7 % — AB (ref 37.7–47.9)
HEMOGLOBIN: 11.7 g/dL — AB (ref 12.2–16.2)
Lymph, poc: 2.6 (ref 0.6–3.4)
MCH, POC: 28.7 pg (ref 27–31.2)
MCHC: 34.6 g/dL (ref 31.8–35.4)
MCV: 82.9 fL (ref 80–97)
MID (cbc): 0.3 (ref 0–0.9)
MPV: 6.4 fL (ref 0–99.8)
POC GRANULOCYTE: 4.1 (ref 2–6.9)
POC LYMPH PERCENT: 37.4 %L (ref 10–50)
POC MID %: 3.9 %M (ref 0–12)
Platelet Count, POC: 239 10*3/uL (ref 142–424)
RBC: 4.07 M/uL (ref 4.04–5.48)
RDW, POC: 13.7 %
WBC: 7 10*3/uL (ref 4.6–10.2)

## 2015-05-24 LAB — POCT GLYCOSYLATED HEMOGLOBIN (HGB A1C): HEMOGLOBIN A1C: 6.6

## 2015-05-24 MED ORDER — DOXYCYCLINE HYCLATE 100 MG PO CAPS: 100.0000 mg | ORAL_CAPSULE | Freq: Two times a day (BID) | ORAL | Status: DC

## 2015-05-24 MED ORDER — PREDNISONE 20 MG PO TABS: ORAL_TABLET | ORAL | Status: DC

## 2015-05-24 NOTE — Patient Instructions (Addendum)
We are going to treat you for a possible pneumonia with the doxycycline antibiotic, and the once daily prednisone for your lungs and wheezing.  Watch your diet as this will raise your blood sugar. Please let me know if you are not feeling better in the next few days- Sooner if worse.   Assuming there are no surprises on the rest of your labs I will send in your surgical clearance Let's plan to recheck in about 4 months   If we don't speak before then I hope all goes great with your knee surgery!    You will be receiving a letter about my change of practice

## 2015-05-24 NOTE — Progress Notes (Addendum)
[facility] [address], [place] [address] [phone]  Date:  05/24/2015   Name:  Jodi Williams   DOB:  1950/12/19   MRN:  [id number]  PCP:  Lamar Blinks, MD    Chief Complaint: Cough and Fatigue   History of Present Illness:  Jodi Williams is a 65 y.o. very pleasant female patient who presents with the following:  She was here on 05/16/2015 with ilness for one week.  She was treated with azithromycin for bronchitis and also tussionex as needed.  I had called her to today to discuss pre-op PPW for a planned knee surgery in March of this year.  We decided to have her come in for an appt today as she was still feeling ill.    She notes that her cough and wheezing are worse- she also feels very tired and "weak."  She may have had a fever last week but this is now resolved.   She still does have congestion and pressure in her sinuses   ST is now resolved No GI symptoms.   She is able to eat ok  She has not really used her inhaler as it makes her feel nervous.  She does have albuterol on hand if she needs it  She does have a history of DM, HTN, asthma  She has had a knee scope in the past but now Dr. Ricki Rodriguez plans to do a partial left knee replacement in March- they have requested operative clearance for her She has never had any problems with anesthesia.  She is able to do 68mets of activity such as climbing stairs, vacuuming, etc; the limiting factor for her now is her knee  She has never had any cardiac concerns.   She has had a lot of trouble with her restless legs as well.  Currently taking 0.25 of mirapex.   Lab Results  Component Value Date   HGBA1C 6.5* 01/18/2015   Dm is generally well controlled   Patient Active Problem List   Diagnosis Date Noted  . Diabetes ([place]) 08/24/2013  . Other and unspecified hyperlipidemia 09/07/2012  . HTN (hypertension) 04/12/2012  . OA (osteoarthritis) of knee 04/12/2012  . 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  Depression with anxiety 04/12/2012  . H/O gastric ulcer 04/12/2012  . ALLERGIC RHINITIS WITH CONJUNCTIVITIS 09/08/2007  . ASTHMA 07/20/2007    Past Medical History  Diagnosis Date  . Depression   . Anxiety   . Allergy   . Arthritis   . Cataract   . Ulcer   . Hyperlipidemia   . Hypertension   . Asthma     Past Surgical History  Procedure Laterality Date  . Breast surgery    . Back surgery    . Hand surgery    . Eye surgery      cataract  . Knee surgery      Social History  Substance Use Topics  . Smoking status: Never Smoker   . Smokeless tobacco: Never Used  . Alcohol Use: No    Family History  Problem Relation Age of Onset  . Hyperlipidemia Mother   . Hypertension Mother   . Heart disease Mother   . Dementia Mother   . Arthritis Mother   . Neuropathy Father   . Diabetes Father   . Kidney disease Father     Allergies  Allergen Reactions  . Omnicef [Cefdinir] Rash    Medication list has been reviewed and updated.  Current Outpatient Prescriptions on File Prior to Visit  Medication Sig Dispense Refill  . albuterol (PROVENTIL HFA;VENTOLIN HFA) 108 (90 BASE) MCG/ACT inhaler Inhale 2 puffs into the lungs every 4 (four) hours as needed for wheezing or shortness of breath (cough, shortness of breath or wheezing.). 1 Inhaler 1  . ALPRAZolam (XANAX) 1 MG tablet TAKE 1 TABLET BY MOUTH EVERY NIGHT AT BEDTIME AS NEEDED FOR SLEEP 30 tablet 1  . aspirin 81 MG tablet Take 81 mg by mouth daily.    . chlorpheniramine-HYDROcodone (TUSSIONEX PENNKINETIC ER) 10-8 MG/5ML SUER Take 5 mLs by mouth every 12 (twelve) hours as needed for cough. 100 mL 0  . citalopram (CELEXA) 20 MG tablet TAKE 2 TABLETS BY MOUTH EVERY DAY 60 tablet 2  . lisinopril-hydrochlorothiazide (PRINZIDE,ZESTORETIC) 10-12.5 MG per tablet TAKE 1 TABLET BY MOUTH DAILY 30 tablet 5  . meloxicam (MOBIC) 7.5 MG tablet TAKE 1 TABLET BY MOUTH DAILY 90 tablet 0  . metFORMIN (GLUCOPHAGE) 500 MG tablet TAKE 1  TABLET(500 MG) BY MOUTH TWICE DAILY WITH A MEAL 180 tablet 0  . omeprazole (PRILOSEC) 20 MG capsule Take 20 mg by mouth daily.    . pravastatin (PRAVACHOL) 20 MG tablet TAKE 1 TABLET BY MOUTH DAILY. MAY TAKE EVERY 2ND OR 3RD DAY IF ACHES 90 tablet 1  . pramipexole (MIRAPEX) 0.25 MG tablet TAKE 1 TABLET BY MOUTH DAILY. 90 tablet 3   No current facility-administered medications on file prior to visit.    Review of Systems:  As per HPI- otherwise negative.   Physical Examination: Filed Vitals:   05/24/15 1512  BP: 138/84  Pulse: 65  Temp: 98.3 F (36.8 C)  Resp: 16   Filed Vitals:   05/24/15 1512  Height: 5' 1.75" (1.568 m)  Weight: 180 lb 9.6 oz (81.92 kg)   Body mass index is 33.32 kg/(m^2). Ideal Body Weight: Weight in (lb) to have BMI = 25: 135.3  GEN: WDWN, NAD, Non-toxic, A & O x 3, looks well, some cough.  overweight HEENT: Atraumatic, Normocephalic. Neck supple. No masses, No LAD.  Bilateral TM wnl, oropharynx normal.  PEERL,EOMI.   Ears and Nose: No external deformity. CV: RRR, No M/G/R. No JVD. No thrill. No extra heart sounds. PULM: mild bilateral rhonchi and wheezing No retractions. No resp. distress. No accessory muscle use. EXTR: No c/c/e NEURO Normal gait.  PSYCH: Normally interactive. Conversant. Not depressed or anxious appearing.  Calm demeanor.   [facility] reading (PRIMARY) by  Dr. Lorelei Pont. CXR:  Mild RML pneumonia CLINICAL DATA: Shortness of breath  EXAM: CHEST 2 VIEW  COMPARISON: 04/12/2015. 08/07/2014.  FINDINGS: Lung volumes are low. Subsegmental atelectasis noted at the right base. The right infrahilar density is stable and may be related to vascularity. The cardiopericardial silhouette is within normal limits for size. The visualized bony structures of the thorax are intact. Subsegmental atelectasis at the right base with stable appearance of ill-defined  IMPRESSION: Right infrahilar opacity. Given the infrahilar finding has been stable  for 10 months, this is probably benign. Consider follow-up chest x-ray in 3-6 months to ensure continued stability.  Results for orders placed or performed in visit on 05/24/15  POCT CBC  Result Value Ref Range   WBC 7.0 4.6 - 10.2 K/uL   Lymph, poc 2.6 0.6 - 3.4   POC LYMPH PERCENT 37.4 10 - 50 %L   MID (cbc) 0.3 0 - 0.9   POC MID % 3.9 0 - 12 %M   POC Granulocyte 4.1 2 -  6.9   Granulocyte percent 58.7 37 - 80 %G   RBC 4.07 4.04 - 5.48 M/uL   Hemoglobin 11.7 (A) 12.2 - 16.2 g/dL   HCT, POC 33.7 (A) 37.7 - 47.9 %   MCV 82.9 80 - 97 fL   MCH, POC 28.7 27 - 31.2 pg   MCHC 34.6 31.8 - 35.4 g/dL   RDW, POC 13.7 %   Platelet Count, POC 239 142 - 424 K/uL   MPV 6.4 0 - 99.8 fL  POCT glycosylated hemoglobin (Hb A1C)  Result Value Ref Range   Hemoglobin A1C 6.6     Assessment and Plan: Cough - Plan: DG Chest 2 View, POCT CBC  Wheezing  Controlled type 2 diabetes mellitus without complication, without long-term current use of insulin (HCC) - Plan: POCT glycosylated hemoglobin (Hb A1C), predniSONE (DELTASONE) 20 MG tablet  Pre-operative cardiovascular examination - Plan: Comprehensive metabolic panel  CAP (community acquired pneumonia) - Plan: doxycycline (VIBRAMYCIN) 100 MG capsule    She is already taking a daily probiotic- encouraged her to keep this up as we are adding a 2nd abd for possible CAP.   Also will treat with a low dose of prednisone for her wheezing. She will watch her glucose while on this medication.  DM continues to be well controlled. She is an intermediate surgical risk due to DM.  Her exercise tolerance is over 4 mets and her planned operation is moderate risk.  Assuming labs are normal should be able to clear for the OR  She is to let me know if not feeling better soon She will try taking 2 of her mirapex for a total of 0.5 mg and let me know if not helpful.  If still not better will try lyrica or another medication Signed Lamar Blinks, MD  Received her  CMP and called her 1/12- her Na is low.  This could be why she has been feeling weak. LMOM with advice to drink sports drink instead of water and liberalize salt intake.  Will call her back later Called and was able to reach her- we will plan to repeat her CXR in about 6 months.  She confirms that she has been on her HCTZ for years and does not want to stop this if she does not absolutely have to.  She admits that she drinks water "all day and all night" because she thought she was supposed to.  Suspect hyponatremia due to polydipsia- if does not resolve will need to DC her diuretic also.  She will cut back on her water, increase salt intake and drink sports drink.  Plan to repeat her BMP in 4-5 days.    Will sign her surgical clearance form with recommendation that her hyponatremia be resolved and that team be aware of her Dm and asthma and HTN

## 2015-05-25 ENCOUNTER — Encounter: Payer: Self-pay | Admitting: Family Medicine

## 2015-05-25 NOTE — Addendum Note (Signed)
Addended by: Lamar Blinks C on: 05/25/2015 12:53 PM   Modules accepted: Orders

## 2015-05-30 ENCOUNTER — Other Ambulatory Visit: Payer: Self-pay | Admitting: Family Medicine

## 2015-06-01 ENCOUNTER — Other Ambulatory Visit: Payer: Self-pay | Admitting: Family Medicine

## 2015-06-02 ENCOUNTER — Encounter: Payer: Self-pay | Admitting: Family Medicine

## 2015-06-04 ENCOUNTER — Other Ambulatory Visit: Payer: Self-pay | Admitting: Family Medicine

## 2015-06-04 NOTE — Telephone Encounter (Signed)
Patient is calling for her xanax.  Surescripts sent in twice.

## 2015-06-07 ENCOUNTER — Encounter: Payer: Self-pay | Admitting: Family Medicine

## 2015-06-20 ENCOUNTER — Other Ambulatory Visit: Payer: Self-pay | Admitting: Family Medicine

## 2015-07-01 ENCOUNTER — Other Ambulatory Visit: Payer: Self-pay | Admitting: Family Medicine

## 2015-07-03 ENCOUNTER — Other Ambulatory Visit: Payer: Self-pay

## 2015-07-03 NOTE — Telephone Encounter (Signed)
Pharm reqs RF of xanax. Pended. 

## 2015-07-04 MED ORDER — ALPRAZOLAM 1 MG PO TABS: 1.0000 mg | ORAL_TABLET | Freq: Every evening | ORAL | Status: DC | PRN

## 2015-07-04 NOTE — Telephone Encounter (Signed)
Done

## 2015-07-04 NOTE — Telephone Encounter (Signed)
Faxed

## 2015-07-05 ENCOUNTER — Encounter: Payer: Self-pay | Admitting: Family Medicine

## 2015-07-05 ENCOUNTER — Ambulatory Visit (INDEPENDENT_AMBULATORY_CARE_PROVIDER_SITE_OTHER): Payer: 59 | Admitting: Family Medicine

## 2015-07-05 ENCOUNTER — Ambulatory Visit (HOSPITAL_BASED_OUTPATIENT_CLINIC_OR_DEPARTMENT_OTHER)
Admission: RE | Admit: 2015-07-05 | Discharge: 2015-07-05 | Disposition: A | Discharge status: Home / Self Care | Payer: 59 | Source: Ambulatory Visit | Attending: Family Medicine | Admitting: Family Medicine

## 2015-07-05 VITALS — BP 132/77 | HR 96 | Temp 98.5°F | Ht 62.0 in | Wt 177.6 lb

## 2015-07-05 DIAGNOSIS — J9811 Atelectasis: Secondary | ICD-10-CM | POA: Diagnosis not present

## 2015-07-05 DIAGNOSIS — R8290 Unspecified abnormal findings in urine: Secondary | ICD-10-CM

## 2015-07-05 DIAGNOSIS — R918 Other nonspecific abnormal finding of lung field: Secondary | ICD-10-CM | POA: Diagnosis not present

## 2015-07-05 DIAGNOSIS — R0602 Shortness of breath: Secondary | ICD-10-CM

## 2015-07-05 DIAGNOSIS — N3000 Acute cystitis without hematuria: Secondary | ICD-10-CM

## 2015-07-05 DIAGNOSIS — R600 Localized edema: Secondary | ICD-10-CM

## 2015-07-05 DIAGNOSIS — R635 Abnormal weight gain: Secondary | ICD-10-CM

## 2015-07-05 DIAGNOSIS — R05 Cough: Secondary | ICD-10-CM | POA: Insufficient documentation

## 2015-07-05 LAB — COMPREHENSIVE METABOLIC PANEL
ALK PHOS: 64 U/L (ref 39–117)
ALT: 30 U/L (ref 0–35)
AST: 25 U/L (ref 0–37)
Albumin: 4.5 g/dL (ref 3.5–5.2)
BUN: 17 mg/dL (ref 6–23)
CALCIUM: 9.9 mg/dL (ref 8.4–10.5)
CO2: 27 mEq/L (ref 19–32)
Chloride: 100 mEq/L (ref 96–112)
Creatinine, Ser: 0.9 mg/dL (ref 0.40–1.20)
GFR: 67.21 mL/min (ref >60.00)
GLUCOSE: 120 mg/dL — AB (ref 70–99)
POTASSIUM: 4.4 meq/L (ref 3.5–5.1)
Sodium: 137 mEq/L (ref 135–145)
TOTAL PROTEIN: 7.4 g/dL (ref 6.0–8.3)
Total Bilirubin: 0.4 mg/dL (ref 0.2–1.2)

## 2015-07-05 LAB — TSH: TSH: 0.76 u[IU]/mL (ref 0.35–4.50)

## 2015-07-05 LAB — TROPONIN I: TNIDX: 0.01 ug/L (ref 0.00–0.06)

## 2015-07-05 LAB — POCT URINALYSIS DIP (MANUAL ENTRY)
Bilirubin, UA: NEGATIVE
GLUCOSE UA: NEGATIVE
Ketones, POC UA: NEGATIVE
Nitrite, UA: NEGATIVE
PROTEIN UA: NEGATIVE
RBC UA: NEGATIVE
SPEC GRAV UA: 1.02
UROBILINOGEN UA: NEGATIVE
pH, UA: 6

## 2015-07-05 LAB — CBC
HEMATOCRIT: 35.4 % — AB (ref 36.0–46.0)
HEMOGLOBIN: 11.8 g/dL — AB (ref 12.0–15.0)
MCHC: 33.5 g/dL (ref 30.0–36.0)
MCV: 86.7 fl (ref 78.0–100.0)
PLATELETS: 260 10*3/uL (ref 150.0–400.0)
RBC: 4.08 Mil/uL (ref 3.87–5.11)
RDW: 14.4 % (ref 11.5–15.5)
WBC: 8.4 10*3/uL (ref 4.0–10.5)

## 2015-07-05 LAB — BRAIN NATRIURETIC PEPTIDE: PRO B NATRI PEPTIDE: 120 pg/mL — AB (ref 0.0–100.0)

## 2015-07-05 NOTE — Patient Instructions (Signed)
Your EKG looks fine- we will also get labs and an x-ray today I will be in touch with you regarding your results asap  I would recommend that you try some compression hose or socks- but these on in the morning

## 2015-07-05 NOTE — Progress Notes (Addendum)
[place] at [facility] [address] [place], [place] [address] [id number] [phone]  Date:  07/05/2015   Name:  Jodi Williams   DOB:  1950/10/27   MRN:  [id number]  PCP:  Lamar Blinks, MD    Chief Complaint: Leg Swelling; Facial Swelling; and Fatigue   History of Present Illness:  Jodi Williams is a 65 y.o. very pleasant female patient who presents with the following:  Here today with complaint of LE swelling and puffiness in her face.  She has noted this for about one month Both of her ankles will swell by the end of the day.   "i'm puffing up all over," feels like she is gaining weight in general She does sit a lot during the day- she will walk some but is mostly seated while at work.    Also she has noted a "heavy feeling" in her chest for about one month- this is present all the time.  It does not get worse with exercise  She is riding her recumbent bike for exercise and does not have any decrease in her exercise tolerance.  She has knee trouble which precludes most other forms of exercise  She fees very tired when she gets home from work and feels like she has no energy  Her mother did have CHF and this has worried her.  Her mother also had kidney and liver concerns.   She will sometimes notice some right sided CP- this has occurred twice in the last few weeks.  This is non- exertional. Lasted about 10 minutes. Not present now  She does not have any known history of CAD.    We did a pre-op for her in January and she was not having any of these issues at that time.  She was ill earlier in January with a cough- treated with azithromycin and tussionex. She was noted to have hyponatremia at her last visit and we will recheck this today  Lab Results  Component Value Date   HGBA1C 6.6 05/24/2015   Wt Readings from Last 3 Encounters:  07/05/15 177 lb 9.6 oz (80.559 kg)  05/24/15 180 lb 9.6 oz (81.92 kg)  05/16/15 180 lb (81.647 kg)    169 lbs 01/2015  Patient Active Problem List   Diagnosis Date Noted  . Restless legs 05/24/2015  . Diabetes ([place]) 08/24/2013  . Other and unspecified hyperlipidemia 09/07/2012  . HTN (hypertension) 04/12/2012  . OA (osteoarthritis) of knee 04/12/2012  . 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 Depression with anxiety 04/12/2012  . H/O gastric ulcer 04/12/2012  . ALLERGIC RHINITIS WITH CONJUNCTIVITIS 09/08/2007  . ASTHMA 07/20/2007    Past Medical History  Diagnosis Date  . Depression   . Anxiety   . Allergy   . Arthritis   . Cataract   . Ulcer   . Hyperlipidemia   . Hypertension   . Asthma     Past Surgical History  Procedure Laterality Date  . Breast surgery    . Back surgery    . Hand surgery    . Eye surgery      cataract  . Knee surgery      Social History  Substance Use Topics  . Smoking status: Never Smoker   . Smokeless tobacco: Never Used  . Alcohol Use: No    Family History  Problem Relation Age of Onset  . Hyperlipidemia Mother   . Hypertension Mother   .  Heart disease Mother   . Dementia Mother   . Arthritis Mother   . Neuropathy Father   . Diabetes Father   . Kidney disease Father     Allergies  Allergen Reactions  . Omnicef [Cefdinir] Rash    Medication list has been reviewed and updated.  Current Outpatient Prescriptions on File Prior to Visit  Medication Sig Dispense Refill  . albuterol (PROVENTIL HFA;VENTOLIN HFA) 108 (90 BASE) MCG/ACT inhaler Inhale 2 puffs into the lungs every 4 (four) hours as needed for wheezing or shortness of breath (cough, shortness of breath or wheezing.). 1 Inhaler 1  . ALPRAZolam (XANAX) 1 MG tablet Take 1 tablet (1 mg total) by mouth at bedtime as needed. for sleep 30 tablet 0  . aspirin 81 MG tablet Take 81 mg by mouth daily.    . chlorpheniramine-HYDROcodone (TUSSIONEX PENNKINETIC ER) 10-8 MG/5ML SUER Take 5 mLs by mouth every 12 (twelve) hours as needed for cough. 100 mL 0  . citalopram (CELEXA) 20 MG  tablet TAKE 2 TABLETS BY MOUTH EVERY DAY 60 tablet 0  . lisinopril-hydrochlorothiazide (PRINZIDE,ZESTORETIC) 10-12.5 MG tablet TAKE 1 TABLET BY MOUTH DAILY 90 tablet 0  . meloxicam (MOBIC) 7.5 MG tablet TAKE 1 TABLET BY MOUTH DAILY 90 tablet 0  . metFORMIN (GLUCOPHAGE) 500 MG tablet TAKE 1 TABLET(500 MG) BY MOUTH TWICE DAILY WITH A MEAL 180 tablet 0  . omeprazole (PRILOSEC) 20 MG capsule Take 20 mg by mouth daily.    . pramipexole (MIRAPEX) 0.25 MG tablet TAKE 1 TABLET BY MOUTH DAILY. 90 tablet 3  . pravastatin (PRAVACHOL) 20 MG tablet TAKE 1 TABLET BY MOUTH DAILY. MAY TAKE EVERY 2ND OR 3RD DAY IF ACHES 90 tablet 1   No current facility-administered medications on file prior to visit.    Review of Systems:  As per HPI- otherwise negative.   Physical Examination: Filed Vitals:   07/05/15 0903  BP: 132/77  Pulse: 96  Temp: 98.5 F (36.9 C)   Filed Vitals:   07/05/15 0903  Height: 5\' 2"  (1.575 m)  Weight: 177 lb 9.6 oz (80.559 kg)   Body mass index is 32.48 kg/(m^2). Ideal Body Weight: Weight in (lb) to have BMI = 25: 136.4  GEN: WDWN, NAD, Non-toxic, A & O x 3, looks well and her normal self HEENT: Atraumatic, Normocephalic. Neck supple. No masses, No LAD.  Bilateral TM wnl, oropharynx normal.  PEERL,EOMI.   Ears and Nose: No external deformity. CV: RRR, No M/G/R. No JVD. No thrill. No extra heart sounds. PULM: CTA B, no wheezes, crackles, rhonchi. No retractions. No resp. distress. No accessory muscle use. ABD: S, NT, ND EXTR: No c/c/- I do not appreciate any edema but she feels like her ankles and calves are larger than normal NEURO Normal gait.  PSYCH: Normally interactive. Conversant. Not depressed or anxious appearing.  Calm demeanor.   EKG: NSR, no abnormality.  C/w old EKG from 2016- no change Dg Chest 2 View  07/05/2015  CLINICAL DATA:  Cough and shortness of breath for 1 month, nonsmoker EXAM: CHEST  2 VIEW COMPARISON:  05/24/2015 FINDINGS: Elevated right diaphragm.  Heart size and vascular pattern are normal. Low lung volumes with subsegmental atelectasis right lung base which is stable. Mild opacity infrahilar area showing improved aeration when compared to prior study suggesting atelectasis. No pleural effusions. IMPRESSION: Low lung volumes with stable atelectasis right lung base. Persistent but improved atelectasis right infrahilar region. Electronically Signed   By: Elodia Florence.D.  On: 07/05/2015 10:19   Results for orders placed or performed in visit on 07/05/15  CBC  Result Value Ref Range   WBC 8.4 4.0 - 10.5 K/uL   RBC 4.08 3.87 - 5.11 Mil/uL   Platelets 260.0 150.0 - 400.0 K/uL   Hemoglobin 11.8 (L) 12.0 - 15.0 g/dL   HCT 35.4 (L) 36.0 - 46.0 %   MCV 86.7 78.0 - 100.0 fl   MCHC 33.5 30.0 - 36.0 g/dL   RDW 14.4 11.5 - 15.5 %  Comprehensive metabolic panel  Result Value Ref Range   Sodium 137 135 - 145 mEq/L   Potassium 4.4 3.5 - 5.1 mEq/L   Chloride 100 96 - 112 mEq/L   CO2 27 19 - 32 mEq/L   Glucose, Bld 120 (H) 70 - 99 mg/dL   BUN 17 6 - 23 mg/dL   Creatinine, Ser 0.90 0.40 - 1.20 mg/dL   Total Bilirubin 0.4 0.2 - 1.2 mg/dL   Alkaline Phosphatase 64 39 - 117 U/L   AST 25 0 - 37 U/L   ALT 30 0 - 35 U/L   Total Protein 7.4 6.0 - 8.3 g/dL   Albumin 4.5 3.5 - 5.2 g/dL   Calcium 9.9 8.4 - 10.5 mg/dL   GFR 67.21 >60.00 mL/min  TSH  Result Value Ref Range   TSH 0.76 0.35 - 4.50 uIU/mL  B Nat Peptide  Result Value Ref Range   Pro B Natriuretic peptide (BNP) 120.0 (H) 0.0 - 100.0 pg/mL  Troponin I  Result Value Ref Range   TNIDX 0.01 0.00 - 0.06 ug/l  POCT urinalysis dipstick  Result Value Ref Range   Color, UA yellow yellow   Clarity, UA clear clear   Glucose, UA negative negative   Bilirubin, UA negative negative   Ketones, POC UA negative negative   Spec Grav, UA 1.020    Blood, UA negative negative   pH, UA 6.0    Protein Ur, POC negative negative   Urobilinogen, UA negative    Nitrite, UA Negative Negative    Leukocytes, UA moderate (2+) (A) Negative    Assessment and Plan: SOB (shortness of breath) - Plan: CBC, Comprehensive metabolic panel, POCT urinalysis dipstick, B Nat Peptide, Troponin I, EKG 12-Lead, DG Chest 2 View  Bilateral edema of lower extremity - Plan: POCT urinalysis dipstick, B Nat Peptide  Weight gain - Plan: TSH  Abnormal urine - Plan: CULTURE, URINE COMPREHENSIVE  Here today with complaint of edema of her feet and legs and chest heaviness for one month.  Her EKG is reassuring.  I do not appreciate any swelling of her feet at this time.  Will start work-up for CHF and will also check a troponin and recheck for hyponatremia.    Received her labs and called her, LMOM- hyponatremia is resolved, troponin is negative.  BNP is slightly high but this likely does not represent any concern.  However given her sx and upcoming operation will refer to cards as she may need a stress test  Signed Lamar Blinks, MD  Called on 2/23- went over labs.  Explained that I do want cardiology to evaluate her prior to her knee surgery.  She is ok with this plan.  Will set up referral   Called her on 2/25-received her urine culture, we will treat for a UTI with macrobid.  She has a cardiology appt next week

## 2015-07-06 ENCOUNTER — Encounter: Payer: Self-pay | Admitting: Family Medicine

## 2015-07-08 LAB — CULTURE, URINE COMPREHENSIVE: Colony Count: 30000

## 2015-07-08 MED ORDER — NITROFURANTOIN MONOHYD MACRO 100 MG PO CAPS: 100.0000 mg | ORAL_CAPSULE | Freq: Two times a day (BID) | ORAL | Status: DC

## 2015-07-08 NOTE — Addendum Note (Signed)
Addended by: Lamar Blinks C on: 07/08/2015 09:52 AM   Modules accepted: Orders

## 2015-07-11 NOTE — Progress Notes (Signed)
HPI: 65 year old female for evaluation of dyspnea, chest pain and preop. Chest x-ray February 2017 showed atelectasis. Laboratories February 2017 showed hemoglobin 11.8, BUN and creatinine 17/0.9, Normal TSH, proBNP 120. Patient is scheduled for knee replacement and we were asked to evaluate preoperatively. She has limited mobility because of bilateral knee pain. She does note dyspnea on exertion but no orthopnea or PND. Occasional minimal pedal edema. She has chest tightness that is continuous. It is not pleuritic, positional or exertional. No associated symptoms. Because of the above we were asked to evaluate.  Current Outpatient Prescriptions  Medication Sig Dispense Refill  . acetaminophen (TYLENOL 8 HOUR ARTHRITIS PAIN) 650 MG CR tablet Take 650 mg by mouth every 8 (eight) hours as needed for pain.    Marland Kitchen albuterol (PROVENTIL HFA;VENTOLIN HFA) 108 (90 BASE) MCG/ACT inhaler Inhale 2 puffs into the lungs every 4 (four) hours as needed for wheezing or shortness of breath (cough, shortness of breath or wheezing.). 1 Inhaler 1  . ALPRAZolam (XANAX) 1 MG tablet Take 1 tablet (1 mg total) by mouth at bedtime as needed. for sleep 30 tablet 0  . citalopram (CELEXA) 20 MG tablet TAKE 2 TABLETS BY MOUTH EVERY DAY 60 tablet 0  . lisinopril-hydrochlorothiazide (PRINZIDE,ZESTORETIC) 10-12.5 MG tablet TAKE 1 TABLET BY MOUTH DAILY 90 tablet 0  . meloxicam (MOBIC) 7.5 MG tablet TAKE 1 TABLET BY MOUTH DAILY 90 tablet 0  . metFORMIN (GLUCOPHAGE) 500 MG tablet TAKE 1 TABLET(500 MG) BY MOUTH TWICE DAILY WITH A MEAL 180 tablet 0  . nitrofurantoin, macrocrystal-monohydrate, (MACROBID) 100 MG capsule Take 1 capsule (100 mg total) by mouth 2 (two) times daily. 14 capsule 0  . omeprazole (PRILOSEC) 20 MG capsule Take 20 mg by mouth daily.    . pramipexole (MIRAPEX) 0.25 MG tablet TAKE 1 TABLET BY MOUTH DAILY. 90 tablet 3  . pravastatin (PRAVACHOL) 20 MG tablet TAKE 1 TABLET BY MOUTH DAILY. MAY TAKE EVERY 2ND OR 3RD  DAY IF ACHES 90 tablet 1   No current facility-administered medications for this visit.    Allergies  Allergen Reactions  . Omnicef [Cefdinir] Rash     Past Medical History  Diagnosis Date  . Depression   . Anxiety   . Allergy   . Arthritis   . Cataract   . Ulcer   . Hyperlipidemia   . Hypertension   . Asthma   . Diabetes mellitus ([place])   . COPD (chronic obstructive pulmonary disease) [facility])     Past Surgical History  Procedure Laterality Date  . Breast surgery    . Back surgery    . Hand surgery    . Eye surgery      cataract  . Knee surgery      Social History   Social History  . Marital Status: Married    Spouse Name: N/A  . Number of Children: 1  . Years of Education: N/A   Occupational History  . House cleaner    Social History Main Topics  . Smoking status: Never Smoker   . Smokeless tobacco: Never Used  . Alcohol Use: 0.0 oz/week    0 Standard drinks or equivalent per week     Comment: Rare  . Drug Use: No  . Sexual Activity: Yes    Birth Control/ Protection: None   Other Topics Concern  . Not on file   Social History Narrative    Family History  Problem Relation Age of Onset  . Hyperlipidemia Mother   .  Hypertension Mother   . Heart disease Mother   . Dementia Mother   . Arthritis Mother   . Neuropathy Father   . Diabetes Father   . Kidney disease Father     ROS: The arthralgias but no fevers or chills, productive cough, hemoptysis, dysphasia, odynophagia, melena, hematochezia, dysuria, hematuria, rash, seizure activity, orthopnea, PND, pedal edema, claudication. Remaining systems are negative.  Physical Exam:   Blood pressure 126/77, pulse 82, height 5\' 2"  (1.575 m), weight 175 lb 1.9 oz (79.434 kg).  General:  Well developed/well nourished in NAD Skin warm/dry Patient not depressed No peripheral clubbing Back-normal HEENT-normal/normal eyelids Neck supple/normal carotid upstroke bilaterally; no bruits; no JVD; no  thyromegaly chest - CTA/ normal expansion CV - RRR/normal S1 and S2; no murmurs, rubs or gallops;  PMI nondisplaced Abdomen -NT/ND, no HSM, no mass, + bowel sounds, no bruit 2+ femoral pulses, no bruits Ext-no edema, chords, 2+ DP Neuro-grossly nonfocal  ECG 07/05/2015-sinus rhythm with no ST changes.

## 2015-07-12 ENCOUNTER — Ambulatory Visit (INDEPENDENT_AMBULATORY_CARE_PROVIDER_SITE_OTHER): Payer: 59 | Admitting: Cardiology

## 2015-07-12 ENCOUNTER — Ambulatory Visit (HOSPITAL_BASED_OUTPATIENT_CLINIC_OR_DEPARTMENT_OTHER)
Admission: RE | Admit: 2015-07-12 | Discharge: 2015-07-12 | Disposition: A | Discharge status: Home / Self Care | Payer: 59 | Source: Ambulatory Visit | Attending: Cardiology | Admitting: Cardiology

## 2015-07-12 ENCOUNTER — Encounter: Payer: Self-pay | Admitting: Cardiology

## 2015-07-12 VITALS — BP 126/77 | HR 82 | Ht 62.0 in | Wt 175.1 lb

## 2015-07-12 DIAGNOSIS — I351 Nonrheumatic aortic (valve) insufficiency: Secondary | ICD-10-CM | POA: Insufficient documentation

## 2015-07-12 DIAGNOSIS — I10 Essential (primary) hypertension: Secondary | ICD-10-CM

## 2015-07-12 DIAGNOSIS — R079 Chest pain, unspecified: Secondary | ICD-10-CM | POA: Insufficient documentation

## 2015-07-12 DIAGNOSIS — Z01818 Encounter for other preprocedural examination: Secondary | ICD-10-CM | POA: Diagnosis not present

## 2015-07-12 DIAGNOSIS — R0600 Dyspnea, unspecified: Secondary | ICD-10-CM | POA: Insufficient documentation

## 2015-07-12 DIAGNOSIS — R072 Precordial pain: Secondary | ICD-10-CM

## 2015-07-12 DIAGNOSIS — E119 Type 2 diabetes mellitus without complications: Secondary | ICD-10-CM | POA: Diagnosis not present

## 2015-07-12 DIAGNOSIS — I071 Rheumatic tricuspid insufficiency: Secondary | ICD-10-CM | POA: Diagnosis not present

## 2015-07-12 DIAGNOSIS — E785 Hyperlipidemia, unspecified: Secondary | ICD-10-CM | POA: Insufficient documentation

## 2015-07-12 DIAGNOSIS — Z01810 Encounter for preprocedural cardiovascular examination: Secondary | ICD-10-CM | POA: Insufficient documentation

## 2015-07-12 NOTE — Assessment & Plan Note (Signed)
Symptoms atypical. Plan nuclear study for risk stratification. She will need a Lexiscan stress nuclear study as she cannot ambulate well with her knee arthralgias.

## 2015-07-12 NOTE — Assessment & Plan Note (Signed)
Blood pressure controlled. Continue present medications. 

## 2015-07-12 NOTE — Assessment & Plan Note (Signed)
Patient has some dyspnea and chest tightness. We will await echocardiogram and nuclear study results. If normal she may proceed with surgery.

## 2015-07-12 NOTE — Assessment & Plan Note (Signed)
Echocardiogram to assess LV function. 

## 2015-07-12 NOTE — Patient Instructions (Signed)
Medication Instructions:   NO CHANGE  Testing/Procedures:  Your physician has requested that you have an echocardiogram. Echocardiography is a painless test that uses sound waves to create images of your heart. It provides your doctor with information about the size and shape of your heart and how well your heart's chambers and valves are working. This procedure takes approximately one hour. There are no restrictions for this procedure.   Your physician has requested that you have a lexiscan myoview. For further information please visit [email or web address]. Please follow instruction sheet, as given.    Follow-Up:  Your physician recommends that you schedule a follow-up appointment in: AS NEEDED      

## 2015-07-12 NOTE — Progress Notes (Signed)
Echocardiogram 2D Echocardiogram has been performed.  Jodi Williams 07/12/2015, 12:18 PM

## 2015-07-13 ENCOUNTER — Ambulatory Visit (HOSPITAL_COMMUNITY): Payer: 59 | Attending: Cardiology

## 2015-07-13 VITALS — Ht 62.0 in | Wt 175.0 lb

## 2015-07-13 DIAGNOSIS — E119 Type 2 diabetes mellitus without complications: Secondary | ICD-10-CM | POA: Insufficient documentation

## 2015-07-13 DIAGNOSIS — R072 Precordial pain: Secondary | ICD-10-CM

## 2015-07-13 DIAGNOSIS — I10 Essential (primary) hypertension: Secondary | ICD-10-CM | POA: Diagnosis not present

## 2015-07-13 DIAGNOSIS — R0609 Other forms of dyspnea: Secondary | ICD-10-CM | POA: Diagnosis not present

## 2015-07-13 DIAGNOSIS — R0602 Shortness of breath: Secondary | ICD-10-CM

## 2015-07-13 MED ORDER — TECHNETIUM TC 99M SESTAMIBI GENERIC - CARDIOLITE
32.5000 | Freq: Once | INTRAVENOUS | Status: AC | PRN
  Administered 2015-07-13: 33 via INTRAVENOUS

## 2015-07-17 ENCOUNTER — Other Ambulatory Visit: Payer: Self-pay | Admitting: Emergency Medicine

## 2015-07-17 ENCOUNTER — Telehealth: Payer: Self-pay | Admitting: Cardiology

## 2015-07-17 ENCOUNTER — Ambulatory Visit (HOSPITAL_COMMUNITY): Payer: 59 | Attending: Cardiovascular Disease

## 2015-07-17 DIAGNOSIS — R072 Precordial pain: Secondary | ICD-10-CM | POA: Diagnosis not present

## 2015-07-17 LAB — MYOCARDIAL PERFUSION IMAGING
CHL CUP NUCLEAR SRS: 9
CHL CUP NUCLEAR SSS: 11
CSEPPHR: 110 {beats}/min
LHR: 0.25
LV dias vol: 71 mL
LV sys vol: 23 mL
NUC STRESS TID: 1.37
Rest HR: 71 {beats}/min
SDS: 2

## 2015-07-17 MED ORDER — AMINOPHYLLINE 25 MG/ML IV SOLN
75.0000 mg | Freq: Once | INTRAVENOUS | Status: AC
  Administered 2015-07-17: 75 mg via INTRAVENOUS

## 2015-07-17 MED ORDER — TECHNETIUM TC 99M SESTAMIBI GENERIC - CARDIOLITE
30.4000 | Freq: Once | INTRAVENOUS | Status: AC | PRN
  Administered 2015-07-17: 30 via INTRAVENOUS

## 2015-07-17 MED ORDER — REGADENOSON 0.4 MG/5ML IV SOLN
0.4000 mg | Freq: Once | INTRAVENOUS | Status: AC
  Administered 2015-07-17: 0.4 mg via INTRAVENOUS

## 2015-07-17 NOTE — Telephone Encounter (Signed)
Spoke with pt, aware echo and nuclear stress test are normal.  Pt is considered low risk cardiac wise for surgery. Will forward this note to the surgery center @ [phone].

## 2015-07-17 NOTE — Telephone Encounter (Signed)
New message      Calling to see if she passed her stress test and if she can have her surgery tomorrow?  Please call

## 2015-07-18 ENCOUNTER — Telehealth: Payer: Self-pay | Admitting: *Deleted

## 2015-07-18 NOTE — Telephone Encounter (Signed)
Filled prescription 1 month .  Patient needs follow up for additional refills

## 2015-08-02 ENCOUNTER — Other Ambulatory Visit: Payer: Self-pay | Admitting: Family Medicine

## 2015-08-02 ENCOUNTER — Other Ambulatory Visit: Payer: Self-pay | Admitting: Physician Assistant

## 2015-08-07 ENCOUNTER — Other Ambulatory Visit: Payer: Self-pay | Admitting: Family Medicine

## 2015-08-07 ENCOUNTER — Other Ambulatory Visit: Payer: Self-pay | Admitting: Physician Assistant

## 2015-08-08 ENCOUNTER — Telehealth: Payer: Self-pay | Admitting: Family Medicine

## 2015-08-08 DIAGNOSIS — G2581 Restless legs syndrome: Secondary | ICD-10-CM

## 2015-08-08 NOTE — Telephone Encounter (Signed)
Relation to [id number]  Call back [place]: [place] [address] - [place], [place] [phone] (Phone) [phone] (Fax)         Reason for call:  Patient requesting a refill pramipexole (MIRAPEX) 0.25 MG tablet

## 2015-08-09 MED ORDER — PRAMIPEXOLE DIHYDROCHLORIDE 0.25 MG PO TABS
ORAL_TABLET | ORAL | Status: DC
Start: 2015-08-09 — End: 2015-10-18

## 2015-08-15 ENCOUNTER — Other Ambulatory Visit: Payer: Self-pay | Admitting: Physician Assistant

## 2015-08-24 ENCOUNTER — Other Ambulatory Visit: Payer: Self-pay | Admitting: Family Medicine

## 2015-08-24 MED ORDER — MELOXICAM 7.5 MG PO TABS: 7.5000 mg | ORAL_TABLET | Freq: Every day | ORAL | Status: DC

## 2015-09-04 ENCOUNTER — Telehealth: Payer: Self-pay | Admitting: Family Medicine

## 2015-09-04 ENCOUNTER — Other Ambulatory Visit: Payer: Self-pay | Admitting: Physician Assistant

## 2015-09-04 MED ORDER — CITALOPRAM HYDROBROMIDE 20 MG PO TABS: 40.0000 mg | ORAL_TABLET | Freq: Every day | ORAL | Status: DC

## 2015-09-04 MED ORDER — METFORMIN HCL 500 MG PO TABS: ORAL_TABLET | ORAL | Status: DC

## 2015-09-04 NOTE — Telephone Encounter (Signed)
Pt is requesting a refill on her medication MetFORMIN  And also Citalopram   Pharmacy: [facility] [id number] - [place], [place]: [phone]

## 2015-09-04 NOTE — Telephone Encounter (Signed)
Called her- max dose of celexa is 20 if she is also on omeprazole.  She does not want to decrease her celexa so she will stop omeprazole. Suggested that she use zantac if stomach sx return.

## 2015-09-20 ENCOUNTER — Other Ambulatory Visit: Payer: Self-pay | Admitting: Emergency Medicine

## 2015-09-25 ENCOUNTER — Other Ambulatory Visit: Payer: Self-pay | Admitting: Family Medicine

## 2015-10-04 ENCOUNTER — Other Ambulatory Visit: Payer: Self-pay | Admitting: Family Medicine

## 2015-10-04 ENCOUNTER — Other Ambulatory Visit: Payer: Self-pay | Admitting: Orthopedic Surgery

## 2015-10-04 DIAGNOSIS — M1711 Unilateral primary osteoarthritis, right knee: Secondary | ICD-10-CM

## 2015-10-05 NOTE — Telephone Encounter (Signed)
Can be reached: [phone] Pharmacy: [facility] [id number] - [place], [place]  Reason for call: Pt leaving for beach today at [facility]. She would like RX sent in asap as she will run out while gone.

## 2015-10-10 ENCOUNTER — Ambulatory Visit
Admission: RE | Admit: 2015-10-10 | Discharge: 2015-10-10 | Disposition: A | Discharge status: Home / Self Care | Payer: 59 | Source: Ambulatory Visit | Attending: Orthopedic Surgery | Admitting: Orthopedic Surgery

## 2015-10-10 DIAGNOSIS — M1711 Unilateral primary osteoarthritis, right knee: Secondary | ICD-10-CM

## 2015-10-12 ENCOUNTER — Ambulatory Visit: Payer: 59 | Admitting: Family Medicine

## 2015-10-18 ENCOUNTER — Ambulatory Visit (INDEPENDENT_AMBULATORY_CARE_PROVIDER_SITE_OTHER): Payer: 59 | Admitting: Family Medicine

## 2015-10-18 ENCOUNTER — Encounter: Payer: Self-pay | Admitting: Family Medicine

## 2015-10-18 VITALS — BP 110/82 | HR 75 | Temp 98.1°F | Ht 62.0 in | Wt 171.6 lb

## 2015-10-18 DIAGNOSIS — E875 Hyperkalemia: Secondary | ICD-10-CM

## 2015-10-18 DIAGNOSIS — G2581 Restless legs syndrome: Secondary | ICD-10-CM | POA: Diagnosis not present

## 2015-10-18 DIAGNOSIS — M79605 Pain in left leg: Secondary | ICD-10-CM

## 2015-10-18 DIAGNOSIS — D649 Anemia, unspecified: Secondary | ICD-10-CM | POA: Diagnosis not present

## 2015-10-18 DIAGNOSIS — Z23 Encounter for immunization: Secondary | ICD-10-CM

## 2015-10-18 DIAGNOSIS — M79604 Pain in right leg: Secondary | ICD-10-CM

## 2015-10-18 DIAGNOSIS — E119 Type 2 diabetes mellitus without complications: Secondary | ICD-10-CM

## 2015-10-18 DIAGNOSIS — K219 Gastro-esophageal reflux disease without esophagitis: Secondary | ICD-10-CM

## 2015-10-18 LAB — BASIC METABOLIC PANEL
BUN: 26 mg/dL — AB (ref 6–23)
CALCIUM: 10.1 mg/dL (ref 8.4–10.5)
CO2: 28 mEq/L (ref 19–32)
CREATININE: 1.06 mg/dL (ref 0.40–1.20)
Chloride: 100 mEq/L (ref 96–112)
GFR: 55.59 mL/min — AB (ref >60.00)
Glucose, Bld: 116 mg/dL — ABNORMAL HIGH (ref 70–99)
Potassium: 5.5 mEq/L — ABNORMAL HIGH (ref 3.5–5.1)
Sodium: 134 mEq/L — ABNORMAL LOW (ref 135–145)

## 2015-10-18 LAB — CBC
HCT: 38.1 % (ref 36.0–46.0)
Hemoglobin: 12.5 g/dL (ref 12.0–15.0)
MCHC: 32.9 g/dL (ref 30.0–36.0)
MCV: 82.2 fl (ref 78.0–100.0)
Platelets: 263 10*3/uL (ref 150.0–400.0)
RBC: 4.63 Mil/uL (ref 3.87–5.11)
RDW: 14.3 % (ref 11.5–15.5)
WBC: 7.5 10*3/uL (ref 4.0–10.5)

## 2015-10-18 LAB — HEMOGLOBIN A1C: Hgb A1c MFr Bld: 6.9 % — ABNORMAL HIGH (ref 4.6–6.5)

## 2015-10-18 MED ORDER — METHOCARBAMOL 500 MG PO TABS: 500.0000 mg | ORAL_TABLET | Freq: Every day | ORAL | Status: DC

## 2015-10-18 MED ORDER — PRAMIPEXOLE DIHYDROCHLORIDE 0.25 MG PO TABS: ORAL_TABLET | ORAL | Status: DC

## 2015-10-18 NOTE — Progress Notes (Signed)
Pre visit review using our clinic review tool, if applicable. No additional management support is needed unless otherwise documented below in the visit note. 

## 2015-10-18 NOTE — Patient Instructions (Addendum)
It was great to see you today- I will be in touch with your labs We will check you for H pylori today- this is a bacteria that can cause heartburn and ulcers. If this is positive we will treat for h pylori. If it is negative, we can put you back on omeprazole since this did a better job of controlling your GERD symptoms You can take a 2nd pill of mirapex in the evening if needed for your restless legs You can also take a robaxin if needed- just remember that both mirapex and robaxin can make you a bit sleepy, taking both together can make you more sleepy!    Assuming your labs look good we can recheck in 6 months or so

## 2015-10-18 NOTE — Progress Notes (Addendum)
[facility] [address] [place], [place] [address] [phone]  Date:  10/18/2015   Name:  Jodi Williams   DOB:  12-23-1950   MRN:  [id number]  PCP:  Lamar Blinks, MD    Chief Complaint: Follow-up   History of Present Illness:  Jodi Williams is a 65 y.o. very pleasant female patient who presents with the following:  Here today for a recheck visit- DM, RLS  Lab Results  Component Value Date   HGBA1C 6.6 05/24/2015   Needs an A1c today, also foot exam is needed The puffiness and swelling in her legs she had noted at her last visit is gone.  She had her left knee replaced 3/7- it is doing great, she has bascially no pain.  She plans to have the right side done this fall  Wt Readings from Last 3 Encounters:  10/18/15 171 lb 9.6 oz (77.837 kg)  07/17/15 175 lb (79.379 kg)  07/12/15 175 lb 1.9 oz (79.434 kg)   Her RLS is still bothering her - she might like to go up on her mirapex. This does help but she often feels like the pill wears off by the time she goes to bed She stopped omeptrazole a month or more ago due to concern about long term effects.   She started ranitiidine but this is not really helping her as well as the omepraole did in the past - her GERD is bothersome again  She and her [place] travel to the beach most weekends She still uses xanax at bedtime and is on her celexa- this seems to be working well for her Patient Active Problem List   Diagnosis Date Noted  . Preop cardiovascular exam 07/12/2015  . Dyspnea 07/12/2015  . Chest pain 07/12/2015  . Restless legs 05/24/2015  . Diabetes ([place]) 08/24/2013  . Other and unspecified hyperlipidemia 09/07/2012  . HTN (hypertension) 04/12/2012  . OA (osteoarthritis) of knee 04/12/2012  . 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 Depression with anxiety 04/12/2012  . H/O gastric ulcer 04/12/2012  . ALLERGIC RHINITIS WITH CONJUNCTIVITIS 09/08/2007  . ASTHMA  07/20/2007    Past Medical History  Diagnosis Date  . Depression   . Anxiety   . Allergy   . Arthritis   . Cataract   . Ulcer   . Hyperlipidemia   . Hypertension   . Asthma   . Diabetes mellitus ([place])   . COPD (chronic obstructive pulmonary disease) [facility])     Past Surgical History  Procedure Laterality Date  . Breast surgery    . Back surgery    . Hand surgery    . Eye surgery      cataract  . Knee surgery      Social History  Substance Use Topics  . Smoking status: Never Smoker   . Smokeless tobacco: Never Used  . Alcohol Use: 0.0 oz/week    0 Standard drinks or equivalent per week     Comment: Rare    Family History  Problem Relation Age of Onset  . Hyperlipidemia Mother   . Hypertension Mother   . Heart disease Mother   . Dementia Mother   . Arthritis Mother   . Neuropathy Father   . Diabetes Father   . Kidney disease Father     Allergies  Allergen Reactions  . Omnicef [Cefdinir] Rash    Medication list has been reviewed and updated.  Current Outpatient  Prescriptions on File Prior to Visit  Medication Sig Dispense Refill  . acetaminophen (TYLENOL 8 HOUR ARTHRITIS PAIN) 650 MG CR tablet Take 650 mg by mouth every 8 (eight) hours as needed for pain.    Marland Kitchen albuterol (PROVENTIL HFA;VENTOLIN HFA) 108 (90 BASE) MCG/ACT inhaler Inhale 2 puffs into the lungs every 4 (four) hours as needed for wheezing or shortness of breath (cough, shortness of breath or wheezing.). 1 Inhaler 1  . ALPRAZolam (XANAX) 1 MG tablet TAKE 1 TABLET BY MOUTH EVERY NIGHT AT BEDTIME AS NEEDED FOR SLEEP 30 tablet 1  . citalopram (CELEXA) 20 MG tablet Take 2 tablets (40 mg total) by mouth daily. 60 tablet 9  . lisinopril-hydrochlorothiazide (PRINZIDE,ZESTORETIC) 10-12.5 MG tablet TAKE 1 TABLET BY MOUTH DAILY 90 tablet 3  . meloxicam (MOBIC) 7.5 MG tablet TAKE 1 TABLET BY MOUTH DAILY 30 tablet 0  . metFORMIN (GLUCOPHAGE) 500 MG tablet TAKE 1 TABLET(500 MG) BY MOUTH TWICE DAILY WITH A MEAL  60 tablet 9  . pramipexole (MIRAPEX) 0.25 MG tablet TAKE 1 TABLET BY MOUTH DAILY. 90 tablet 3  . pravastatin (PRAVACHOL) 20 MG tablet TAKE 1 TABLET BY MOUTH DAILY. MAY TAKE EVERY 2ND OR 3RD DAY IF ACHES 90 tablet 1   No current facility-administered medications on file prior to visit.    Review of Systems:  As per HPI- otherwise negative.   Physical Examination: Filed Vitals:   10/18/15 0910  BP: 110/82  Pulse: 75  Temp: 98.1 F (36.7 C)   Filed Vitals:   10/18/15 0910  Height: 5\' 2"  (1.575 m)  Weight: 171 lb 9.6 oz (77.837 kg)   Body mass index is 31.38 kg/(m^2). Ideal Body Weight: Weight in (lb) to have BMI = 25: 136.4  GEN: WDWN, NAD, Non-toxic, A & O x 3, looks well, overweight HEENT: Atraumatic, Normocephalic. Neck supple. No masses, No LAD.  Bilateral TM wnl, oropharynx normal.  PEERL,EOMI.   Ears and Nose: No external deformity. CV: RRR, No M/G/R. No JVD. No thrill. No extra heart sounds. PULM: CTA B, no wheezes, crackles, rhonchi. No retractions. No resp. distress. No accessory muscle use. ABD: S, NT, ND, +BS. No rebound. No HSM. EXTR: No c/c/e.  Normal bilateral foot exam, no calf swelling or tenderness  NEURO Normal gait.  PSYCH: Normally interactive. Conversant. Not depressed or anxious appearing.  Calm demeanor.    Assessment and Plan: RLS (restless legs syndrome) - Plan: methocarbamol (ROBAXIN) 500 MG tablet, pramipexole (MIRAPEX) 0.25 MG tablet  Leg pain, bilateral  Immunization due - Plan: Varicella-zoster vaccine subcutaneous  Controlled type 2 diabetes mellitus without complication, without long-term current use of insulin (HCC) - Plan: Hemoglobin 123456, Basic Metabolic Panel (BMET)  Gastroesophageal reflux disease without esophagitis - Plan: H. pylori breath test  Anemia, unspecified anemia type - Plan: CBC  It was great to see you today- I will be in touch with your labs We will check you for H pylori today- this is a bacteria that can cause  heartburn and ulcers. If this is positive we will treat for h pylori. If it is negative, we can put you back on omeprazole since this did a better job of controlling your GERD symptoms You can take a 2nd pill of mirapex in the evening if needed for your restless legs You can also take a robaxin if needed- just remember that both mirapex and robaxin can make you a bit sleepy, taking both together can make you more sleepy!    Cautioned her  to decrease her xanax to 0.5 if she is also taking the mirapex and robaxin  Plan recheck in 6 months   Signed Lamar Blinks, MD  Received her labs 6/8-  Results for orders placed or performed in visit on 10/18/15  Hemoglobin A1c  Result Value Ref Range   Hgb A1c MFr Bld 6.9 (H) 4.6 - 6.5 %  Basic Metabolic Panel (BMET)  Result Value Ref Range   Sodium 134 (L) 135 - 145 mEq/L   Potassium 5.5 (H) 3.5 - 5.1 mEq/L   Chloride 100 96 - 112 mEq/L   CO2 28 19 - 32 mEq/L   Glucose, Bld 116 (H) 70 - 99 mg/dL   BUN 26 (H) 6 - 23 mg/dL   Creatinine, Ser 1.06 0.40 - 1.20 mg/dL   Calcium 10.1 8.4 - 10.5 mg/dL   GFR 55.59 (L) >60.00 mL/min  CBC  Result Value Ref Range   WBC 7.5 4.0 - 10.5 K/uL   RBC 4.63 3.87 - 5.11 Mil/uL   Platelets 263.0 150.0 - 400.0 K/uL   Hemoglobin 12.5 12.0 - 15.0 g/dL   HCT 38.1 36.0 - 46.0 %   MCV 82.2 78.0 - 100.0 fl   MCHC 32.9 30.0 - 36.0 g/dL   RDW 14.3 11.5 - 15.5 %   Called her- labs look good except her K is high.  She will come in next week for a recheck.  Suspect this may be spurious. She is not taking any K supplements.

## 2015-10-19 ENCOUNTER — Encounter: Payer: Self-pay | Admitting: Family Medicine

## 2015-10-19 NOTE — Addendum Note (Signed)
Addended by: Lamar Blinks C on: 10/19/2015 06:17 PM   Modules accepted: Orders

## 2015-10-20 LAB — H. PYLORI BREATH TEST: H. PYLORI BREATH TEST: NOT DETECTED

## 2015-10-30 ENCOUNTER — Other Ambulatory Visit: Payer: Self-pay | Admitting: Family Medicine

## 2015-11-01 ENCOUNTER — Other Ambulatory Visit: Payer: Self-pay | Admitting: Family Medicine

## 2015-11-06 ENCOUNTER — Telehealth: Payer: Self-pay | Admitting: Family Medicine

## 2015-11-06 NOTE — Telephone Encounter (Signed)
Relation to [id number] Call back [place]: [id number] - [place], [place]  Reason for call:  Patient states she's requesting acid reflux medication. Please advise

## 2015-11-07 ENCOUNTER — Other Ambulatory Visit (INDEPENDENT_AMBULATORY_CARE_PROVIDER_SITE_OTHER): Payer: 59

## 2015-11-07 ENCOUNTER — Encounter: Payer: Self-pay | Admitting: Family Medicine

## 2015-11-07 DIAGNOSIS — E875 Hyperkalemia: Secondary | ICD-10-CM

## 2015-11-07 LAB — BASIC METABOLIC PANEL
BUN: 20 mg/dL (ref 6–23)
CALCIUM: 9.8 mg/dL (ref 8.4–10.5)
CHLORIDE: 102 meq/L (ref 96–112)
CO2: 27 meq/L (ref 19–32)
Creatinine, Ser: 0.88 mg/dL (ref 0.40–1.20)
GFR: 68.9 mL/min (ref >60.00)
Glucose, Bld: 141 mg/dL — ABNORMAL HIGH (ref 70–99)
POTASSIUM: 4.6 meq/L (ref 3.5–5.1)
SODIUM: 136 meq/L (ref 135–145)

## 2015-11-07 MED ORDER — OMEPRAZOLE 40 MG PO CPDR: 40.0000 mg | DELAYED_RELEASE_CAPSULE | Freq: Every day | ORAL | Status: DC

## 2015-11-07 NOTE — Telephone Encounter (Signed)
Called pt- she would like to go back on omeprazole.  This is fine, but she will need to decrease her celexa to 20 mg.  She will cut to 30 mg, then 20, then may start on omeprazole.  She will let me know if she does not do ok on celexa 20.  Of note recent qtc was quite normal

## 2015-11-07 NOTE — Telephone Encounter (Signed)
OV notes from 10/18/2015 reviewed, Pt mentioned worsening GERD Sx's at OV w/ use of Ranitidine and was recommended to restart Omeprazole. Pt calling in for Rx. Please advise.

## 2015-12-01 ENCOUNTER — Other Ambulatory Visit: Payer: Self-pay | Admitting: Emergency Medicine

## 2015-12-01 ENCOUNTER — Telehealth: Payer: Self-pay | Admitting: Family Medicine

## 2015-12-01 MED ORDER — MELOXICAM 7.5 MG PO TABS: 7.5000 mg | ORAL_TABLET | Freq: Every day | ORAL | Status: DC

## 2015-12-01 NOTE — Telephone Encounter (Signed)
Relation to [id number] Call back [phone] Pharmacy: [facility] [place], [place] [phone] (Phone) [phone] (Fax)         Reason for call:  Patient requesting a refill meloxicam (MOBIC) 7.5 MG tablet

## 2015-12-28 ENCOUNTER — Other Ambulatory Visit: Payer: Self-pay | Admitting: Family Medicine

## 2015-12-31 ENCOUNTER — Other Ambulatory Visit: Payer: Self-pay | Admitting: Family Medicine

## 2016-01-25 ENCOUNTER — Other Ambulatory Visit: Payer: Self-pay | Admitting: Emergency Medicine

## 2016-01-25 MED ORDER — PRAVASTATIN SODIUM 20 MG PO TABS: ORAL_TABLET | ORAL | 0 refills | Status: DC

## 2016-01-25 NOTE — Telephone Encounter (Signed)
Received refill request for Pravastatin. Lipid last checked 01/18/2015. Is it ok to refill? Please advise.

## 2016-01-28 ENCOUNTER — Other Ambulatory Visit: Payer: Self-pay | Admitting: Family Medicine

## 2016-01-30 ENCOUNTER — Other Ambulatory Visit: Payer: Self-pay | Admitting: Emergency Medicine

## 2016-01-30 MED ORDER — MELOXICAM 7.5 MG PO TABS: ORAL_TABLET | ORAL | 0 refills | Status: DC

## 2016-02-22 LAB — HM DIABETES EYE EXAM

## 2016-02-29 ENCOUNTER — Other Ambulatory Visit: Payer: Self-pay | Admitting: Family Medicine

## 2016-03-01 ENCOUNTER — Encounter: Payer: Self-pay | Admitting: Family Medicine

## 2016-03-03 ENCOUNTER — Other Ambulatory Visit: Payer: Self-pay | Admitting: Family Medicine

## 2016-03-04 ENCOUNTER — Other Ambulatory Visit: Payer: Self-pay | Admitting: Emergency Medicine

## 2016-03-04 MED ORDER — ALPRAZOLAM 1 MG PO TABS: 1.0000 mg | ORAL_TABLET | Freq: Every evening | ORAL | 1 refills | Status: DC | PRN

## 2016-03-04 NOTE — Telephone Encounter (Signed)
Received refill request for ALPRAZOLAM 1MG  TABLETS. Last office visit 10/18/15 and last refill 12/28/15. Is it ok to refill? Please advise.

## 2016-03-26 ENCOUNTER — Telehealth: Payer: Self-pay | Admitting: Family Medicine

## 2016-03-26 NOTE — Telephone Encounter (Signed)
Pt called in because she says that she and her spouse both need to have CPE before the end of the year. Pt says that it is needed for work insurance. Is it okay to work pt in sooner?

## 2016-03-27 NOTE — Telephone Encounter (Signed)
Yes that will be fine. 

## 2016-03-28 ENCOUNTER — Encounter: Payer: Self-pay | Admitting: Family Medicine

## 2016-03-28 ENCOUNTER — Ambulatory Visit (INDEPENDENT_AMBULATORY_CARE_PROVIDER_SITE_OTHER): Payer: 59 | Admitting: Family Medicine

## 2016-03-28 VITALS — BP 115/60 | HR 78 | Temp 98.2°F | Ht 62.0 in | Wt 170.2 lb

## 2016-03-28 DIAGNOSIS — E119 Type 2 diabetes mellitus without complications: Secondary | ICD-10-CM

## 2016-03-28 DIAGNOSIS — Z23 Encounter for immunization: Secondary | ICD-10-CM

## 2016-03-28 DIAGNOSIS — J0110 Acute frontal sinusitis, unspecified: Secondary | ICD-10-CM | POA: Diagnosis not present

## 2016-03-28 DIAGNOSIS — E785 Hyperlipidemia, unspecified: Secondary | ICD-10-CM | POA: Diagnosis not present

## 2016-03-28 DIAGNOSIS — I10 Essential (primary) hypertension: Secondary | ICD-10-CM

## 2016-03-28 DIAGNOSIS — Z0001 Encounter for general adult medical examination with abnormal findings: Secondary | ICD-10-CM | POA: Diagnosis not present

## 2016-03-28 DIAGNOSIS — Z0000 Encounter for general adult medical examination without abnormal findings: Secondary | ICD-10-CM

## 2016-03-28 MED ORDER — AMOXICILLIN 500 MG PO CAPS
1000.0000 mg | ORAL_CAPSULE | Freq: Two times a day (BID) | ORAL | 0 refills | Status: DC
Start: 2016-03-28 — End: 2016-05-01

## 2016-03-28 NOTE — Patient Instructions (Signed)
It was great to see you today- you are doing a great job taking care of yourself!   Cut your BP mediation to a 1/2 tablet as your BP is running low We are going to treat your sinus infection with amoxicillin as directed; if not improved in the next several days let me know and I will send in an rx for steroids Your got your flu shot today I will be in touch with your labs asap

## 2016-03-28 NOTE — Progress Notes (Addendum)
[place] at [facility] [address], [place], [place] [address] [phone] [id number] [phone]  Date:  03/28/2016   Name:  Jodi Williams   DOB:  22-Jul-1950   MRN:  [id number]  PCP:  Lamar Blinks, MD    Chief Complaint: Annual Exam (Last PAP and mammogram 12/2015. Would like flu vaccine today. Pt has physical form that will need to be filled out for her job. c/o sinus presure that has been present for 1 month with bilateral ear pressure. )   History of Present Illness:  Jodi Williams is a 65 y.o. very pleasant female patient who presents with the following:  Here today for annual exam- she needs as form completed for her insurance but her mammo and pap are UTD per her OBG She is due for a cholesterol and A1c test today- she has been watching her diet and hopes that this will show improvement.  She is still taking metformin twice daily, pravachol., and her lisinorpil/ hctz She recently had her right knee replaced and is doing very well in this regard- she is exercising on the stationary bike and is having very little pain, her function and ROM are making good progress  Wt Readings from Last 3 Encounters:  03/28/16 170 lb 3.2 oz (77.2 kg)  10/18/15 171 lb 9.6 oz (77.8 kg)  07/17/15 175 lb (79.4 kg)   Her niece's husband dove into a lake and broke his neck in August; he is paralyzed.  He is making progress in therapy and the family is hopeful but this has been very hard on them all  She has noted sinus pressure and her ears feel clogged- this has gone on for the last 4+ weeks. Wonders if she has a sinus infection No ST No fever.  No cough.  She has tried some breathe right strips at night to help herself breath She is blowing clear material from her nose She is using tylenol sinus and congestion  Patient Active Problem List   Diagnosis Date Noted  . Preop cardiovascular exam 07/12/2015  . Dyspnea 07/12/2015  . Chest pain 07/12/2015  . Restless  legs 05/24/2015  . Diabetes ([place]) 08/24/2013  . Other and unspecified hyperlipidemia 09/07/2012  . HTN (hypertension) 04/12/2012  . OA (osteoarthritis) of knee 04/12/2012  . 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 Depression with anxiety 04/12/2012  . H/O gastric ulcer 04/12/2012  . ALLERGIC RHINITIS WITH CONJUNCTIVITIS 09/08/2007  . ASTHMA 07/20/2007    Past Medical History:  Diagnosis Date  . Allergy   . Anxiety   . Arthritis   . Asthma   . Cataract   . COPD (chronic obstructive pulmonary disease) ([place])   . Depression   . Diabetes mellitus ([place])   . Hyperlipidemia   . Hypertension   . Ulcer [facility])     Past Surgical History:  Procedure Laterality Date  . BACK SURGERY    . BREAST SURGERY    . EYE SURGERY     cataract  . HAND SURGERY    . KNEE SURGERY      Social History  Substance Use Topics  . Smoking status: Never Smoker  . Smokeless tobacco: Never Used  . Alcohol use 0.0 oz/week     Comment: Rare    Family History  Problem Relation Age of Onset  . Hyperlipidemia Mother   . Hypertension Mother   . Heart disease Mother   . Dementia Mother   . Arthritis  Mother   . Neuropathy Father   . Diabetes Father   . Kidney disease Father     Allergies  Allergen Reactions  . Omnicef [Cefdinir] Rash    Medication list has been reviewed and updated.  Current Outpatient Prescriptions on File Prior to Visit  Medication Sig Dispense Refill  . acetaminophen (TYLENOL 8 HOUR ARTHRITIS PAIN) 650 MG CR tablet Take 650 mg by mouth every 8 (eight) hours as needed for pain.    Marland Kitchen albuterol (PROVENTIL HFA;VENTOLIN HFA) 108 (90 BASE) MCG/ACT inhaler Inhale 2 puffs into the lungs every 4 (four) hours as needed for wheezing or shortness of breath (cough, shortness of breath or wheezing.). 1 Inhaler 1  . ALPRAZolam (XANAX) 1 MG tablet Take 1 tablet (1 mg total) by mouth at bedtime as needed. for sleep 30 tablet 1  . citalopram (CELEXA) 20 MG tablet Take 2 tablets (40 mg total) by  mouth daily. 60 tablet 9  . lisinopril-hydrochlorothiazide (PRINZIDE,ZESTORETIC) 10-12.5 MG tablet TAKE 1 TABLET BY MOUTH DAILY 90 tablet 3  . meloxicam (MOBIC) 7.5 MG tablet TAKE 1 TABLET(7.5 MG) BY MOUTH DAILY 30 tablet 0  . metFORMIN (GLUCOPHAGE) 500 MG tablet TAKE 1 TABLET(500 MG) BY MOUTH TWICE DAILY WITH A MEAL 60 tablet 9  . methocarbamol (ROBAXIN) 500 MG tablet Take 1 tablet (500 mg total) by mouth daily. Use if needed for leg pain 30 tablet 1  . omeprazole (PRILOSEC) 40 MG capsule Take 1 capsule (40 mg total) by mouth daily. 90 capsule 3  . pramipexole (MIRAPEX) 0.25 MG tablet Take 1 in the evening as needed for leg pain. May take a 2nd tablet if needed 180 tablet 3  . pravastatin (PRAVACHOL) 20 MG tablet TAKE 1 TABLET BY MOUTH DAILY. MAY TAKE EVERY 2ND OR 3RD DAY IF ACHES 90 tablet 1  . pravastatin (PRAVACHOL) 20 MG tablet TAKE 1 TABLET BY MOUTH DAILY. MAY TAKE EVERY 2ND OR 3RD DAY IF ACHES 90 tablet 0   No current facility-administered medications on file prior to visit.     Review of Systems:  As per HPI- otherwise negative.   Physical Examination: Vitals:   03/28/16 1600  BP: 115/60  Pulse: 78  Temp: 98.2 F (36.8 C)   Vitals:   03/28/16 1600  Weight: 170 lb 3.2 oz (77.2 kg)  Height: 5\' 2"  (1.575 m)   Body mass index is 31.13 kg/m. Ideal Body Weight: Weight in (lb) to have BMI = 25: 136.4  GEN: WDWN, NAD, Non-toxic, A & O x 3, overweight but has lost, looks well HEENT: Atraumatic, Normocephalic. Neck supple. No masses, No LAD.  Bilateral TM wnl, oropharynx normal.  PEERL,EOMI.   Nasal cavity is inflamed with purulent discharge on the left only Ears and Nose: No external deformity. CV: RRR, No M/G/R. No JVD. No thrill. No extra heart sounds. PULM: CTA B, no wheezes, crackles, rhonchi. No retractions. No resp. distress. No accessory muscle use. ABD: S, NT, ND. No rebound. No HSM. EXTR: No c/c/e NEURO Normal gait.  PSYCH: Normally interactive. Conversant. Not  depressed or anxious appearing.  Calm demeanor.    Assessment and Plan: Physical exam  Encounter for immunization - Plan: oxyCODONE (OXY IR/ROXICODONE) 5 MG immediate release tablet, amoxicillin (AMOXIL) 500 MG capsule, Flu Vaccine QUAD 36+ mos IM  Acute non-recurrent frontal sinusitis - Plan: amoxicillin (AMOXIL) 500 MG capsule  Controlled type 2 diabetes mellitus without complication, without long-term current use of insulin (HCC) - Plan: Basic metabolic panel, Hemoglobin A1C  Dyslipidemia - Plan: Lipid panel  Essential hypertension, benign  Here today for a CPE.   Labs pending as above Her weight and BP look very good Continue medications except will decrease her BP dosage as her BP is running low likely due to weight loss Will plan further follow- up pending labs. Amoxicillin for sinusitis- she has taken this medication recently and tolerates it well, she is not allergic to this medication  Meds ordered this encounter  Medications  . oxyCODONE (OXY IR/ROXICODONE) 5 MG immediate release tablet    Sig: Take 1-2 tablets by mouth twice daily as needed.    Refill:  0  . amoxicillin (AMOXIL) 500 MG capsule    Sig: Take 4 capsules prior to dental appointment for cleanings.    Refill:  2  . amoxicillin (AMOXIL) 500 MG capsule    Sig: Take 2 capsules (1,000 mg total) by mouth 2 (two) times daily.    Dispense:  40 capsule    Refill:  0   Signed Lamar Blinks, MD  Received her labs and called 11/20.  Her a1c is about the same.  Her cholesterol ratio is not as good as it had been. She does have some aches with her pravachol but nothing that she cannot tolerate; she takes it daily.  She is doing to try taking 40 mg of pravachol daily for one week and let me know how she tolerates this   Results for orders placed or performed in visit on [id number]  Basic metabolic panel  Result Value Ref Range   Sodium 135 135 - 145 mEq/L   Potassium 4.2 3.5 - 5.1 mEq/L   Chloride 98 96 - 112  mEq/L   CO2 26 19 - 32 mEq/L   Glucose, Bld 116 (H) 70 - 99 mg/dL   BUN 14 6 - 23 mg/dL   Creatinine, Ser 0.98 0.40 - 1.20 mg/dL   Calcium 9.8 8.4 - 10.5 mg/dL   GFR 60.77 >60.00 mL/min  Lipid panel  Result Value Ref Range   Cholesterol 203 (H) 0 - 200 mg/dL   Triglycerides 288.0 (H) 0.0 - 149.0 mg/dL   HDL 44.80 >39.00 mg/dL   VLDL 57.6 (H) 0.0 - 40.0 mg/dL   Total CHOL/HDL Ratio 5    NonHDL 158.47   Hemoglobin A1C  Result Value Ref Range   Hgb A1c MFr Bld 7.2 (H) 4.6 - 6.5 %  LDL cholesterol, direct  Result Value Ref Range   Direct LDL 112.0 mg/dL

## 2016-03-28 NOTE — Progress Notes (Signed)
Pre visit review using our clinic review tool, if applicable. No additional management support is needed unless otherwise documented below in the visit note. 

## 2016-03-29 LAB — BASIC METABOLIC PANEL
BUN: 14 mg/dL (ref 6–23)
CALCIUM: 9.8 mg/dL (ref 8.4–10.5)
CO2: 26 meq/L (ref 19–32)
CREATININE: 0.98 mg/dL (ref 0.40–1.20)
Chloride: 98 mEq/L (ref 96–112)
GFR: 60.77 mL/min (ref >60.00)
GLUCOSE: 116 mg/dL — AB (ref 70–99)
Potassium: 4.2 mEq/L (ref 3.5–5.1)
SODIUM: 135 meq/L (ref 135–145)

## 2016-03-29 LAB — LIPID PANEL
Cholesterol: 203 mg/dL — ABNORMAL HIGH (ref 0–200)
HDL: 44.8 mg/dL (ref >39.00)
NONHDL: 158.47
Total CHOL/HDL Ratio: 5
Triglycerides: 288 mg/dL — ABNORMAL HIGH (ref 0.0–149.0)
VLDL: 57.6 mg/dL — ABNORMAL HIGH (ref 0.0–40.0)

## 2016-03-29 LAB — HEMOGLOBIN A1C: HEMOGLOBIN A1C: 7.2 % — AB (ref 4.6–6.5)

## 2016-03-29 LAB — LDL CHOLESTEROL, DIRECT: LDL DIRECT: 112 mg/dL

## 2016-04-01 ENCOUNTER — Ambulatory Visit (INDEPENDENT_AMBULATORY_CARE_PROVIDER_SITE_OTHER): Payer: 59 | Admitting: Physician Assistant

## 2016-04-01 ENCOUNTER — Encounter: Payer: Self-pay | Admitting: Family Medicine

## 2016-04-01 ENCOUNTER — Ambulatory Visit (HOSPITAL_BASED_OUTPATIENT_CLINIC_OR_DEPARTMENT_OTHER)
Admission: RE | Admit: 2016-04-01 | Discharge: 2016-04-01 | Disposition: A | Discharge status: Home / Self Care | Payer: 59 | Source: Ambulatory Visit | Attending: Physician Assistant | Admitting: Physician Assistant

## 2016-04-01 ENCOUNTER — Other Ambulatory Visit: Payer: Self-pay | Admitting: Family Medicine

## 2016-04-01 VITALS — BP 124/76 | HR 96 | Temp 98.9°F | Resp 17 | Ht 62.0 in | Wt 171.0 lb

## 2016-04-01 DIAGNOSIS — R1013 Epigastric pain: Secondary | ICD-10-CM

## 2016-04-01 DIAGNOSIS — R197 Diarrhea, unspecified: Secondary | ICD-10-CM

## 2016-04-01 DIAGNOSIS — K760 Fatty (change of) liver, not elsewhere classified: Secondary | ICD-10-CM | POA: Diagnosis not present

## 2016-04-01 DIAGNOSIS — R1011 Right upper quadrant pain: Secondary | ICD-10-CM

## 2016-04-01 LAB — COMPLETE METABOLIC PANEL WITH GFR
ALT: 14 U/L (ref 6–29)
AST: 14 U/L (ref 10–35)
Albumin: 4.3 g/dL (ref 3.6–5.1)
Alkaline Phosphatase: 76 U/L (ref 33–130)
BILIRUBIN TOTAL: 0.3 mg/dL (ref 0.2–1.2)
BUN: 14 mg/dL (ref 7–25)
CALCIUM: 9.4 mg/dL (ref 8.6–10.4)
CHLORIDE: 102 mmol/L (ref 98–110)
CO2: 25 mmol/L (ref 20–31)
Creat: 0.89 mg/dL (ref 0.50–0.99)
GFR, EST AFRICAN AMERICAN: 80 mL/min (ref >=60)
GFR, EST NON AFRICAN AMERICAN: 69 mL/min (ref >=60)
Glucose, Bld: 176 mg/dL — ABNORMAL HIGH (ref 65–99)
Potassium: 3.9 mmol/L (ref 3.5–5.3)
Sodium: 136 mmol/L (ref 135–146)
Total Protein: 6.9 g/dL (ref 6.1–8.1)

## 2016-04-01 LAB — POCT CBC
Granulocyte percent: 71.2 %G (ref 37–80)
HEMATOCRIT: 33.4 % — AB (ref 37.7–47.9)
Hemoglobin: 11.4 g/dL — AB (ref 12.2–16.2)
LYMPH, POC: 2.1 (ref 0.6–3.4)
MCH, POC: 27.6 pg (ref 27–31.2)
MCHC: 34.1 g/dL (ref 31.8–35.4)
MCV: 81.1 fL (ref 80–97)
MID (cbc): 0.6 (ref 0–0.9)
MPV: 6.6 fL (ref 0–99.8)
PLATELET COUNT, POC: 228 10*3/uL (ref 142–424)
POC GRANULOCYTE: 6.8 (ref 2–6.9)
POC LYMPH %: 22.6 % (ref 10–50)
POC MID %: 6.2 %M (ref 0–12)
RBC: 4.13 M/uL (ref 4.04–5.48)
RDW, POC: 14.1 %
WBC: 9.5 10*3/uL (ref 4.6–10.2)

## 2016-04-01 LAB — POCT URINALYSIS DIP (MANUAL ENTRY)
BILIRUBIN UA: NEGATIVE
BILIRUBIN UA: NEGATIVE
Glucose, UA: NEGATIVE
Leukocytes, UA: NEGATIVE
Nitrite, UA: NEGATIVE
PH UA: 5
Protein Ur, POC: NEGATIVE
SPEC GRAV UA: 1.01
Urobilinogen, UA: 0.2

## 2016-04-01 LAB — POC MICROSCOPIC URINALYSIS (UMFC): MUCUS RE: ABSENT

## 2016-04-01 LAB — LIPASE: LIPASE: 23 U/L (ref 7–60)

## 2016-04-01 MED ORDER — GI COCKTAIL ~~LOC~~
30.0000 mL | Freq: Once | ORAL | Status: AC
  Administered 2016-04-01: 30 mL via ORAL

## 2016-04-01 NOTE — Patient Instructions (Addendum)
You have an appointment at [facility] at [place] [employer].  [phone]  Seek care at the ER if symptoms worsen overnight. We will let you know what your results show as soon as we get them.  Continue drinking water and eating bland diet in the meantime.   IF you received an x-ray today, you will receive an invoice from [facility]. Please contact [facility] at [phone] with questions or concerns regarding your invoice.   IF you received labwork today, you will receive an invoice from [employer]. Please contact [facility] at [phone] with questions or concerns regarding your invoice.   Our billing staff will not be able to assist you with questions regarding bills from these companies.  You will be contacted with the lab results as soon as they are available. The fastest way to get your results is to activate your My Chart account. Instructions are located on the last page of this paperwork. If you have not heard from [place] regarding the results in 2 weeks, please contact this office.

## 2016-04-01 NOTE — Progress Notes (Signed)
Jodi Williams  MRN: [id number] DOB: March 01, 1951  Subjective:  Jodi Williams is a 65 y.o. female seen in office today for a chief complaint of constant RUQ pain x 4 days. Pain is worsened when she lies on her right side or after she eats any kind of food. Has associated nausea, one episode of vomiting yesterday, and diarrhea (6 episodes a day since 4 days ago). Denies heartburn, regurgitation, fever, chills, and new food exposure.   She saw Dr. Edilia Bo last week for annual physical exam the same night this all started. She talked with Dr. Edilia Bo today about abdominal pain and she was informed to either come here or go to the ER to have further evaluation because it could be her gallbladder.   She was put on amoxicillin four days ago and also given a flu shot but other than that she is not changed anything. She is still taking the antibiotics. Denies smoking. Drinks alcohol occasionally.   Of note, pt has diabetes, her last A1C on 03/28/16 was 7.2. She has not had any abdominal surgeries. She does have a history of GERD and stomach ulcers. She is followed closely by Dr. Oletta Lamas for this.   Review of Systems  Respiratory: Negative for cough and shortness of breath.   Cardiovascular: Negative for chest pain and palpitations.  Gastrointestinal: Negative for abdominal distention, anal bleeding, blood in stool, constipation and rectal pain.  Genitourinary: Negative for difficulty urinating, dysuria, flank pain, frequency, hematuria and urgency.  Neurological: Negative for dizziness, light-headedness and headaches.   Patient Active Problem List   Diagnosis Date Noted  . Preop cardiovascular exam 07/12/2015  . Dyspnea 07/12/2015  . Chest pain 07/12/2015  . Restless legs 05/24/2015  . Diabetes ([place]) 08/24/2013  . Other and unspecified hyperlipidemia 09/07/2012  . HTN (hypertension) 04/12/2012  . OA (osteoarthritis) of knee 04/12/2012  . 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 Depression with  anxiety 04/12/2012  . H/O gastric ulcer 04/12/2012  . ALLERGIC RHINITIS WITH CONJUNCTIVITIS 09/08/2007  . ASTHMA 07/20/2007    Current Outpatient Prescriptions on File Prior to Visit  Medication Sig Dispense Refill  . acetaminophen (TYLENOL 8 HOUR ARTHRITIS PAIN) 650 MG CR tablet Take 650 mg by mouth every 8 (eight) hours as needed for pain.    Marland Kitchen albuterol (PROVENTIL HFA;VENTOLIN HFA) 108 (90 BASE) MCG/ACT inhaler Inhale 2 puffs into the lungs every 4 (four) hours as needed for wheezing or shortness of breath (cough, shortness of breath or wheezing.). 1 Inhaler 1  . ALPRAZolam (XANAX) 1 MG tablet Take 1 tablet (1 mg total) by mouth at bedtime as needed. for sleep 30 tablet 1  . amoxicillin (AMOXIL) 500 MG capsule Take 4 capsules prior to dental appointment for cleanings.  2  . amoxicillin (AMOXIL) 500 MG capsule Take 2 capsules (1,000 mg total) by mouth 2 (two) times daily. 40 capsule 0  . citalopram (CELEXA) 20 MG tablet Take 2 tablets (40 mg total) by mouth daily. 60 tablet 9  . lisinopril-hydrochlorothiazide (PRINZIDE,ZESTORETIC) 10-12.5 MG tablet TAKE 1 TABLET BY MOUTH DAILY 90 tablet 3  . meloxicam (MOBIC) 7.5 MG tablet TAKE 1 TABLET(7.5 MG) BY MOUTH DAILY 30 tablet 0  . metFORMIN (GLUCOPHAGE) 500 MG tablet TAKE 1 TABLET(500 MG) BY MOUTH TWICE DAILY WITH A MEAL 60 tablet 9  . methocarbamol (ROBAXIN) 500 MG tablet Take 1 tablet (500 mg total) by mouth daily. Use if needed for leg pain 30 tablet 1  . omeprazole (PRILOSEC) 40 MG capsule  Take 1 capsule (40 mg total) by mouth daily. 90 capsule 3  . oxyCODONE (OXY IR/ROXICODONE) 5 MG immediate release tablet Take 1-2 tablets by mouth twice daily as needed.  0  . pramipexole (MIRAPEX) 0.25 MG tablet Take 1 in the evening as needed for leg pain. May take a 2nd tablet if needed 180 tablet 3  . pravastatin (PRAVACHOL) 20 MG tablet TAKE 1 TABLET BY MOUTH DAILY. MAY TAKE EVERY 2ND OR 3RD DAY IF ACHES 90 tablet 1  . pravastatin (PRAVACHOL) 20 MG tablet  TAKE 1 TABLET BY MOUTH DAILY. MAY TAKE EVERY 2ND OR 3RD DAY IF ACHES 90 tablet 0   No current facility-administered medications on file prior to visit.     Allergies  Allergen Reactions  . Omnicef [Cefdinir] Rash       Past Surgical History:  Procedure Laterality Date  . BACK SURGERY    . BREAST SURGERY    . EYE SURGERY     cataract  . HAND SURGERY    . KNEE SURGERY     Social History   Social History  . Marital status: Married    Spouse name: N/A  . Number of children: 1  . Years of education: N/A   Occupational History  . House cleaner    Social History Main Topics  . Smoking status: Never Smoker  . Smokeless tobacco: Never Used  . Alcohol use 0.0 oz/week     Comment: Rare  . Drug use: No  . Sexual activity: Yes    Birth control/ protection: None   Other Topics Concern  . Not on file   Social History Narrative  . No narrative on file     Objective:  BP 124/76 (BP Location: Right Arm, Patient Position: Sitting, Cuff Size: Normal)   Pulse 96   Temp 98.9 F (37.2 C) (Oral)   Resp 17   Ht 5\' 2"  (1.575 m)   Wt 171 lb (77.6 kg)   SpO2 96%   BMI 31.28 kg/m   Physical Exam  Constitutional: She is oriented to person, place, and time and well-developed, well-nourished, and in no distress.  HENT:  Head: Normocephalic and atraumatic.  Eyes: Conjunctivae are normal.  Neck: Normal range of motion.  Cardiovascular: Normal rate, regular rhythm and normal heart sounds.   Pulmonary/Chest: Effort normal.    Abdominal: Soft. Normal appearance. Bowel sounds are hyperactive. There is tenderness (in the RUQ extending to midline of abdomen). There is no CVA tenderness and no tenderness at McBurney's point. Guarding: with palpation of RUQ.  Neurological: She is alert and oriented to person, place, and time. Gait normal.  Skin: Skin is warm and dry.  Psychiatric: Affect normal.  Vitals reviewed.  Results for orders placed or performed in visit on 04/01/16 (from the  past 24 hour(s))  POCT CBC     Status: Abnormal   Collection Time: 04/01/16  2:46 PM  Result Value Ref Range   WBC 9.5 4.6 - 10.2 K/uL   Lymph, poc 2.1 0.6 - 3.4   POC LYMPH PERCENT 22.6 10 - 50 %L   MID (cbc) 0.6 0 - 0.9   POC MID % 6.2 0 - 12 %M   POC Granulocyte 6.8 2 - 6.9   Granulocyte percent 71.2 37 - 80 %G   RBC 4.13 4.04 - 5.48 M/uL   Hemoglobin 11.4 (A) 12.2 - 16.2 g/dL   HCT, POC 33.4 (A) 37.7 - 47.9 %   MCV 81.1 80 - 97  fL   MCH, POC 27.6 27 - 31.2 pg   MCHC 34.1 31.8 - 35.4 g/dL   RDW, POC 14.1 %   Platelet Count, POC 228 142 - 424 K/uL   MPV 6.6 0 - 99.8 fL  POCT urinalysis dipstick     Status: Abnormal   Collection Time: 04/01/16  2:48 PM  Result Value Ref Range   Color, UA yellow yellow   Clarity, UA clear clear   Glucose, UA negative negative   Bilirubin, UA negative negative   Ketones, POC UA negative negative   Spec Grav, UA 1.010    Blood, UA trace-intact (A) negative   pH, UA 5.0    Protein Ur, POC negative negative   Urobilinogen, UA 0.2    Nitrite, UA Negative Negative   Leukocytes, UA Negative Negative  POCT Microscopic Urinalysis ([facility])     Status: None   Collection Time: 04/01/16  2:50 PM  Result Value Ref Range   WBC,UR,HPF,POC None None WBC/hpf   RBC,UR,HPF,POC None None RBC/hpf   Bacteria None None, Too numerous to count   Mucus Absent Absent   Epithelial Cells, UR Per Microscopy None None, Too numerous to count cells/hpf   Post GI cocktail, pt still exquisitely tender with palpation of RUQ.   Assessment and Plan :  This case was precepted with Dr. Brigitte Pulse  1. Abdominal pain, epigastric - Lipase - gi cocktail (Maalox,Lidocaine,Donnatal); Take 30 mLs by mouth once.  2. Abdominal pain, right upper quadrant - POCT CBC - POCT Microscopic Urinalysis ([facility]) - POCT urinalysis dipstick - US Abdomen Limited RUQ; Future  3. Diarrhea, unspecified type - COMPLETE METABOLIC PANEL WITH GFR - Gastrointestinal Pathogen Panel PCR  -Pt scheduled  for STAT RUQ abdomen US. Sent to [employer] at 630pm. -Informed that I will call her as soon as I have [place] results. Instructed to seek care at the ER if symptoms worsen overnight.   Jodi Delaine PA-C  [facility] 04/01/2016 2:11 PM

## 2016-04-02 ENCOUNTER — Ambulatory Visit (HOSPITAL_COMMUNITY): Payer: 59

## 2016-04-03 ENCOUNTER — Ambulatory Visit: Payer: 59 | Admitting: Family Medicine

## 2016-04-03 ENCOUNTER — Other Ambulatory Visit: Payer: Self-pay | Admitting: Emergency Medicine

## 2016-04-03 ENCOUNTER — Telehealth: Payer: Self-pay | Admitting: Family Medicine

## 2016-04-03 MED ORDER — PREDNISONE 20 MG PO TABS: ORAL_TABLET | ORAL | 0 refills | Status: DC

## 2016-04-03 NOTE — Telephone Encounter (Signed)
Caller name: Chanette Relation to pt: self Call back [place]: [facility]  Reason for call: Pt states was seen on 03-28-16 for congestion and pt was informed during her visit if she did not get well to inform her PCP about it, pt states has gotten a little bit better but is still having congestion and headaches, the antibiotic has not gotten her well, pt would like to know if she can get something else to make her feel better (Pt mentioned that provider stated if she was not well that she would get prednisone) Please advise ASAP.

## 2016-04-03 NOTE — Telephone Encounter (Signed)
Lab Results  Component Value Date   HGBA1C 7.2 (H) 03/28/2016   Called her[- she continues to have pain and pressure in her sinuses, would like to try a short course of prednisone. Will do 20 mg for 3-5 days; reminded her to watch her diet as this may increase her glucose.  Also no nsaids while on prednisone She will let me know if not better soon

## 2016-04-08 NOTE — Telephone Encounter (Signed)
Patient has completed prednisone, still has pressure/headache/has had only a little improvement. Advise

## 2016-04-08 NOTE — Telephone Encounter (Signed)
Called cell/home number left message to call back. 

## 2016-04-09 NOTE — Telephone Encounter (Signed)
Called her earlier today and LMOM.  Called her again around 6pm- she is feeling much better, still has some pressure in her nose and head but is overall feeling ok.  She will try adding an OTC allergy med like claritin and let me know if not continuing to improve

## 2016-04-24 ENCOUNTER — Other Ambulatory Visit: Payer: Self-pay | Admitting: Family Medicine

## 2016-04-30 ENCOUNTER — Other Ambulatory Visit: Payer: Self-pay | Admitting: Family Medicine

## 2016-05-01 ENCOUNTER — Other Ambulatory Visit: Payer: Self-pay | Admitting: Family Medicine

## 2016-05-01 NOTE — Telephone Encounter (Signed)
Received a refill request for Xanax 1mg  tablets (take 1 tablet po qhs prn for sleep.).  Last Rf: 04/02/2016 Last Ov: 04/01/2016 No upcoming scheduled visit.  Forwarded to the Provider for review and signature.

## 2016-06-20 ENCOUNTER — Other Ambulatory Visit: Payer: Self-pay | Admitting: Family Medicine

## 2016-06-20 DIAGNOSIS — G2581 Restless legs syndrome: Secondary | ICD-10-CM

## 2016-06-24 ENCOUNTER — Telehealth: Payer: Self-pay | Admitting: Family Medicine

## 2016-06-24 NOTE — Telephone Encounter (Signed)
Please advise 

## 2016-06-24 NOTE — Telephone Encounter (Signed)
Caller name: Relationship to patient: Self Can be reached: [phone] Pharmacy:  [facility], [place] [phone] (Phone) [phone] (Fax)     Reason for call: Patient states she has another sinus infection and request to have an antibiotic called in

## 2016-06-24 NOTE — Telephone Encounter (Signed)
Please give her a call- as it is not a weekend or holiday we should see he if she feels that she needs abx.  Please make her an appt this week.

## 2016-06-25 ENCOUNTER — Other Ambulatory Visit: Payer: Self-pay | Admitting: Family Medicine

## 2016-06-25 NOTE — Telephone Encounter (Signed)
Spoke to pt. Tried to schedule pt an appt today, pt does have to go to work today. The only appt slots that are left will be around the time the pt will need to go to work. Pt states that she just bought a new otc medication and will try that today and if that doesn't help she will give [place] a call back.

## 2016-06-26 ENCOUNTER — Other Ambulatory Visit: Payer: Self-pay | Admitting: Emergency Medicine

## 2016-06-26 MED ORDER — ALPRAZOLAM 1 MG PO TABS: 1.0000 mg | ORAL_TABLET | Freq: Every evening | ORAL | 1 refills | Status: DC | PRN

## 2016-06-26 NOTE — Telephone Encounter (Signed)
Ok 30 and 1 RF 

## 2016-06-26 NOTE — Telephone Encounter (Signed)
Received refill request for Alprazolam 1 MG tablet. Last office visit 03/28/16 and last refill 05/01/16, 30 tablets and 1 refill. Dr. Lillie Fragmin patient.

## 2016-06-26 NOTE — Telephone Encounter (Addendum)
Rx faxed to [facility]

## 2016-07-23 ENCOUNTER — Other Ambulatory Visit: Payer: Self-pay | Admitting: Family Medicine

## 2016-07-24 ENCOUNTER — Other Ambulatory Visit: Payer: Self-pay | Admitting: Family Medicine

## 2016-08-19 ENCOUNTER — Other Ambulatory Visit: Payer: Self-pay | Admitting: Internal Medicine

## 2016-08-19 ENCOUNTER — Other Ambulatory Visit: Payer: Self-pay | Admitting: Family Medicine

## 2016-08-19 DIAGNOSIS — G2581 Restless legs syndrome: Secondary | ICD-10-CM

## 2016-08-21 ENCOUNTER — Ambulatory Visit: Payer: 59 | Admitting: Family Medicine

## 2016-08-22 ENCOUNTER — Ambulatory Visit (INDEPENDENT_AMBULATORY_CARE_PROVIDER_SITE_OTHER): Payer: 59 | Admitting: Family Medicine

## 2016-08-22 VITALS — BP 118/69 | HR 86 | Temp 98.5°F | Ht 62.0 in | Wt 169.6 lb

## 2016-08-22 DIAGNOSIS — E119 Type 2 diabetes mellitus without complications: Secondary | ICD-10-CM | POA: Diagnosis not present

## 2016-08-22 DIAGNOSIS — E785 Hyperlipidemia, unspecified: Secondary | ICD-10-CM | POA: Diagnosis not present

## 2016-08-22 DIAGNOSIS — R519 Headache, unspecified: Secondary | ICD-10-CM

## 2016-08-22 DIAGNOSIS — R51 Headache: Secondary | ICD-10-CM | POA: Diagnosis not present

## 2016-08-22 DIAGNOSIS — G8929 Other chronic pain: Secondary | ICD-10-CM

## 2016-08-22 DIAGNOSIS — I10 Essential (primary) hypertension: Secondary | ICD-10-CM

## 2016-08-22 DIAGNOSIS — Z5181 Encounter for therapeutic drug level monitoring: Secondary | ICD-10-CM | POA: Diagnosis not present

## 2016-08-22 LAB — HEMOGLOBIN A1C: Hgb A1c MFr Bld: 7.8 % — ABNORMAL HIGH (ref 4.6–6.5)

## 2016-08-22 LAB — CBC
HEMATOCRIT: 38.2 % (ref 36.0–46.0)
HEMOGLOBIN: 12.4 g/dL (ref 12.0–15.0)
MCHC: 32.5 g/dL (ref 30.0–36.0)
MCV: 79.7 fl (ref 78.0–100.0)
PLATELETS: 255 10*3/uL (ref 150.0–400.0)
RBC: 4.8 Mil/uL (ref 3.87–5.11)
RDW: 15.7 % — ABNORMAL HIGH (ref 11.5–15.5)
WBC: 8.3 10*3/uL (ref 4.0–10.5)

## 2016-08-22 LAB — COMPREHENSIVE METABOLIC PANEL
ALBUMIN: 4.5 g/dL (ref 3.5–5.2)
ALK PHOS: 75 U/L (ref 39–117)
ALT: 19 U/L (ref 0–35)
AST: 14 U/L (ref 0–37)
BUN: 20 mg/dL (ref 6–23)
CALCIUM: 9.9 mg/dL (ref 8.4–10.5)
CHLORIDE: 102 meq/L (ref 96–112)
CO2: 26 mEq/L (ref 19–32)
Creatinine, Ser: 0.86 mg/dL (ref 0.40–1.20)
GFR: 70.57 mL/min (ref >60.00)
Glucose, Bld: 167 mg/dL — ABNORMAL HIGH (ref 70–99)
POTASSIUM: 4.2 meq/L (ref 3.5–5.1)
SODIUM: 135 meq/L (ref 135–145)
TOTAL PROTEIN: 7.5 g/dL (ref 6.0–8.3)
Total Bilirubin: 0.3 mg/dL (ref 0.2–1.2)

## 2016-08-22 LAB — LIPID PANEL
CHOL/HDL RATIO: 4
CHOLESTEROL: 205 mg/dL — AB (ref 0–200)
HDL: 51.8 mg/dL (ref >39.00)
NonHDL: 153.53
TRIGLYCERIDES: 204 mg/dL — AB (ref 0.0–149.0)
VLDL: 40.8 mg/dL — AB (ref 0.0–40.0)

## 2016-08-22 LAB — TSH: TSH: 0.82 u[IU]/mL (ref 0.35–4.50)

## 2016-08-22 LAB — LDL CHOLESTEROL, DIRECT: LDL DIRECT: 109 mg/dL

## 2016-08-22 NOTE — Patient Instructions (Addendum)
It was nice to see you today!  I will be in touch with your labs I am sorry that you are having a hard time losing weight- don't be too hard on yourself however.  You are exercising and eating right, and you are fit, have excellent blood pressure We are going to have you see neurology to discuss your headaches.  Assuming all checks out there, if you like we could consider trying one of a couple of weight loss meds. Please call your insurance company and inquire about   Contrave  Qsymia (this actually contains topamax which can be helpful for headaches- if the neurologist suggests topamax we might try this!)

## 2016-08-22 NOTE — Progress Notes (Signed)
Pre visit review using our clinic review tool, if applicable. No additional management support is needed unless otherwise documented below in the visit note. 

## 2016-08-22 NOTE — Progress Notes (Addendum)
Jodi Williams at [facility] [address], [place], [place] [address] [phone]  Date:  08/22/2016   Name:  Jodi Williams   DOB:  [birth year]   MRN:  [id number]  PCP:  Lamar Blinks, MD    Chief Complaint: Sinus Problem (c/o sinus headahces. pt states that she has a hx of headaches. Pt would like to discuss increasing dose of Xanax. )   History of Present Illness:  Jodi Williams is a 66 y.o. very pleasant female patient who presents with the following: Last seen by myself for a CPE in November-   Here today to discuss a couple of concerns She notes a history of frequent HA.  "I keep a nagging headache all the time." states that she has had a constant headache for 20 - 30 years.  She got worried because a friend was recently dx with a brain tumor.  She will use tylenol as needed but does not feel that it really helps. She used to give herself some sort of shot 20 years ago "that would knock me out" but she is not sure exactly what this was.  She has been seen by a headache clinic/ neurology at some point but this was 30 years ago.   Apparently they told her that she "would end up with MS" per her MRI.  She never followed up.    Also she is frustrated by difficulty losing weight, and wonders about increasing her xanax dose She is on xanax 1mg  at bedtime for sleep- I refilled this for her about 10 days ago States that she uses the xanax to sleep because her headaches keep her up at night  History of HTN, diabetes, high cholesterol. She had her left knee replaced a year ago, and the right knee last fall. She sees Aluisio- he did an injection to her left knee last week due to   She has been doing weight watchers, and also just recently started a low carb diet.  Wonders about various weight loss drugs  BP Readings from Last 3 Encounters:  08/22/16 118/69  04/01/16 124/76  03/28/16 115/60   Wt Readings from Last 3 Encounters:  08/22/16 169  lb 9.6 oz (76.9 kg)  04/01/16 171 lb (77.6 kg)  03/28/16 170 lb 3.2 oz (77.2 kg)   Lab Results  Component Value Date   HGBA1C 7.2 (H) 03/28/2016   She had coffee so far today- nothing else.  Would like to get labs today  Patient Active Problem List   Diagnosis Date Noted  . Preop cardiovascular exam 07/12/2015  . Dyspnea 07/12/2015  . Chest pain 07/12/2015  . Restless legs 05/24/2015  . Diabetes ([place]) 08/24/2013  . Other and unspecified hyperlipidemia 09/07/2012  . HTN (hypertension) 04/12/2012  . OA (osteoarthritis) of knee 04/12/2012  . 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 Depression with anxiety 04/12/2012  . H/O gastric ulcer 04/12/2012  . ALLERGIC RHINITIS WITH CONJUNCTIVITIS 09/08/2007  . ASTHMA 07/20/2007    Past Medical History:  Diagnosis Date  . Allergy   . Anxiety   . Arthritis   . Asthma   . Cataract   . COPD (chronic obstructive pulmonary disease) ([place])   . Depression   . Diabetes mellitus ([place])   . Hyperlipidemia   . Hypertension   . Ulcer [facility])     Past Surgical History:  Procedure Laterality Date  . BACK SURGERY    . BREAST SURGERY    .  EYE SURGERY     cataract  . HAND SURGERY    . KNEE SURGERY      Social History  Substance Use Topics  . Smoking status: Never Smoker  . Smokeless tobacco: Never Used  . Alcohol use 0.0 oz/week     Comment: Rare    Family History  Problem Relation Age of Onset  . Hyperlipidemia Mother   . Hypertension Mother   . Heart disease Mother   . Dementia Mother   . Arthritis Mother   . Neuropathy Father   . Diabetes Father   . Kidney disease Father     Allergies  Allergen Reactions  . Omnicef [Cefdinir] Rash    Medication list has been reviewed and updated.  Current Outpatient Prescriptions on File Prior to Visit  Medication Sig Dispense Refill  . acetaminophen (TYLENOL 8 HOUR ARTHRITIS PAIN) 650 MG CR tablet Take 650 mg by mouth every 8 (eight) hours as needed for pain.    Marland Kitchen albuterol (PROVENTIL  HFA;VENTOLIN HFA) 108 (90 BASE) MCG/ACT inhaler Inhale 2 puffs into the lungs every 4 (four) hours as needed for wheezing or shortness of breath (cough, shortness of breath or wheezing.). 1 Inhaler 1  . ALPRAZolam (XANAX) 1 MG tablet TAKE 1 TABLET BY MOUTH EVERY NIGHT AT BEDTIME AS NEEDED FOR SLEEP 30 tablet 2  . amoxicillin (AMOXIL) 500 MG capsule Take 4 capsules prior to dental appointment for cleanings.  2  . citalopram (CELEXA) 20 MG tablet TAKE 2 TABLETS(40 MG) BY MOUTH DAILY 60 tablet 0  . lisinopril-hydrochlorothiazide (PRINZIDE,ZESTORETIC) 10-12.5 MG tablet TAKE 1 TABLET BY MOUTH DAILY 90 tablet 3  . meloxicam (MOBIC) 7.5 MG tablet TAKE 1 TABLET(7.5 MG) BY MOUTH DAILY 30 tablet 3  . metFORMIN (GLUCOPHAGE) 500 MG tablet TAKE 1 TABLET BY MOUTH TWICE DAILY WITH MEALS( NEEDS APPOINTMENT) 60 tablet 0  . methocarbamol (ROBAXIN) 500 MG tablet Take 1 tablet (500 mg total) by mouth daily. Use if needed for leg pain 30 tablet 1  . omeprazole (PRILOSEC) 40 MG capsule Take 1 capsule (40 mg total) by mouth daily. 90 capsule 3  . oxyCODONE (OXY IR/ROXICODONE) 5 MG immediate release tablet Take 1-2 tablets by mouth twice daily as needed.  0  . pramipexole (MIRAPEX) 0.25 MG tablet TAKE 1 TABLET BY MOUTH EVERY EVENING AS NEEDED FOR LEG PAIN. MAY TAKE 2ND TABLET IF NEEDED 180 tablet 0  . pravastatin (PRAVACHOL) 20 MG tablet TAKE 1 TABLET BY MOUTH DAILY. MAY TAKE EVERY 2ND OR 3RD DAY IF ACHES 90 tablet 0  . predniSONE (DELTASONE) 20 MG tablet Take 1 pill daily for 3-5 days 5 tablet 0  . XIIDRA 5 % SOLN INT 1 GTT INTO OU BID  3   No current facility-administered medications on file prior to visit.     Review of Systems:  As per HPI- otherwise negative.   Physical Examination: Vitals:   08/22/16 0923  BP: 118/69  Pulse: 86  Temp: 98.5 F (36.9 C)   Vitals:   08/22/16 0923  Weight: 169 lb 9.6 oz (76.9 kg)  Height: 5\' 2"  (1.575 m)   Body mass index is 31.02 kg/m. Ideal Body Weight: Weight in  (lb) to have BMI = 25: 136.4  GEN: WDWN, NAD, Non-toxic, A & O x 3, overweight, otherwise looks very well and heathy  HEENT: Atraumatic, Normocephalic. Neck supple. No masses, No LAD. Ears and Nose: No external deformity. CV: RRR, No M/G/R. No JVD. No thrill. No extra heart sounds.  PULM: CTA B, no wheezes, crackles, rhonchi. No retractions. No resp. distress. No accessory muscle use. ABD: S, NT, ND, +BS. No rebound. No HSM. EXTR: No c/c/e NEURO Normal gait.  PSYCH: Normally interactive. Conversant. Not depressed or anxious appearing.  Calm demeanor.    Assessment and Plan: Chronic nonintractable headache, unspecified headache type - Plan: Ambulatory referral to Neurology  Controlled type 2 diabetes mellitus without complication, without long-term current use of insulin ([place]) - Plan: Comprehensive metabolic panel, Hemoglobin A1c  Dyslipidemia - Plan: Lipid panel  Essential hypertension, benign - Plan: CBC, TSH  Medication monitoring encounter - Plan: CBC  Here today complaining of a constant headache for between 20 and 30 YEARS, such that the HA keeps her awake at night and she is requesting a higher dose of xanax to help her sleep.  She is also concerned about not being able to lose weight and would like to check on her DM today Her BP is under good control BP Readings from Last 3 Encounters:  08/22/16 118/69  04/01/16 124/76  03/28/16 115/60   Explained that a constant HA for such a long period is unusual, and I would like to have her evaluated by neurology instead of increasing her xanax dose.  She is ok with this idea. Once cleared by neurology we can further discuss possible medications for weight loss Signed Lamar Blinks, MD  Received her labs 4/13- would like to try an SGLT-2 for her, looks like farxiga is preferred by her plan. Gave her a call but did not reach- will try her again later.    Called pt on 4/17 and did reach her.  She thought about [facility] [place] but would  prefer to try once weekly trulicity as she is concerned about yeast vaginitis.  Will rx this for her- she has no known history of endocrine tumors   She will come in for a recheck in 3 months  Results for orders placed or performed in visit on 08/22/16  CBC  Result Value Ref Range   WBC 8.3 4.0 - 10.5 K/uL   RBC 4.80 3.87 - 5.11 Mil/uL   Platelets 255.0 150.0 - 400.0 K/uL   Hemoglobin 12.4 12.0 - 15.0 g/dL   HCT 38.2 36.0 - 46.0 %   MCV 79.7 78.0 - 100.0 fl   MCHC 32.5 30.0 - 36.0 g/dL   RDW 15.7 (H) 11.5 - 15.5 %  Comprehensive metabolic panel  Result Value Ref Range   Sodium 135 135 - 145 mEq/L   Potassium 4.2 3.5 - 5.1 mEq/L   Chloride 102 96 - 112 mEq/L   CO2 26 19 - 32 mEq/L   Glucose, Bld 167 (H) 70 - 99 mg/dL   BUN 20 6 - 23 mg/dL   Creatinine, Ser 0.86 0.40 - 1.20 mg/dL   Total Bilirubin 0.3 0.2 - 1.2 mg/dL   Alkaline Phosphatase 75 39 - 117 U/L   AST 14 0 - 37 U/L   ALT 19 0 - 35 U/L   Total Protein 7.5 6.0 - 8.3 g/dL   Albumin 4.5 3.5 - 5.2 g/dL   Calcium 9.9 8.4 - 10.5 mg/dL   GFR 70.57 >60.00 mL/min  Lipid panel  Result Value Ref Range   Cholesterol 205 (H) 0 - 200 mg/dL   Triglycerides 204.0 (H) 0.0 - 149.0 mg/dL   HDL 51.80 >39.00 mg/dL   VLDL 40.8 (H) 0.0 - 40.0 mg/dL   Total CHOL/HDL Ratio 4    NonHDL 153.53   Hemoglobin  A1c  Result Value Ref Range   Hgb A1c MFr Bld 7.8 (H) 4.6 - 6.5 %  TSH  Result Value Ref Range   TSH 0.82 0.35 - 4.50 uIU/mL  LDL cholesterol, direct  Result Value Ref Range   Direct LDL 109.0 mg/dL

## 2016-08-26 ENCOUNTER — Ambulatory Visit: Payer: 59 | Admitting: Neurology

## 2016-08-26 ENCOUNTER — Telehealth: Payer: Self-pay | Admitting: *Deleted

## 2016-08-26 NOTE — Telephone Encounter (Signed)
Called and spoke with pt. R/s appt from 08/26/16 to 09/09/16 d/t office closed, power outage. Pt verbalized understanding and appreciation for call.

## 2016-08-27 ENCOUNTER — Encounter: Payer: Self-pay | Admitting: Family Medicine

## 2016-08-27 MED ORDER — DULAGLUTIDE 0.75 MG/0.5ML ~~LOC~~ SOAJ: 0.7500 mg | SUBCUTANEOUS | 11 refills | Status: DC

## 2016-08-27 NOTE — Addendum Note (Signed)
Addended by: Lamar Blinks C on: 08/27/2016 07:22 PM   Modules accepted: Orders

## 2016-09-08 ENCOUNTER — Other Ambulatory Visit: Payer: Self-pay | Admitting: Family Medicine

## 2016-09-09 ENCOUNTER — Ambulatory Visit: Payer: Self-pay | Admitting: Neurology

## 2016-09-10 ENCOUNTER — Other Ambulatory Visit: Payer: Self-pay | Admitting: Emergency Medicine

## 2016-09-10 MED ORDER — MELOXICAM 7.5 MG PO TABS: ORAL_TABLET | ORAL | 3 refills | Status: DC

## 2016-09-17 ENCOUNTER — Other Ambulatory Visit: Payer: Self-pay | Admitting: Family Medicine

## 2016-09-19 ENCOUNTER — Other Ambulatory Visit: Payer: Self-pay | Admitting: Orthopedic Surgery

## 2016-09-19 DIAGNOSIS — Z96652 Presence of left artificial knee joint: Principal | ICD-10-CM

## 2016-09-19 DIAGNOSIS — Z471 Aftercare following joint replacement surgery: Secondary | ICD-10-CM

## 2016-09-30 ENCOUNTER — Ambulatory Visit
Admission: RE | Admit: 2016-09-30 | Discharge: 2016-09-30 | Disposition: A | Discharge status: Home / Self Care | Payer: 59 | Source: Ambulatory Visit | Attending: Orthopedic Surgery | Admitting: Orthopedic Surgery

## 2016-09-30 DIAGNOSIS — Z96652 Presence of left artificial knee joint: Principal | ICD-10-CM

## 2016-09-30 DIAGNOSIS — Z471 Aftercare following joint replacement surgery: Secondary | ICD-10-CM

## 2016-10-15 ENCOUNTER — Other Ambulatory Visit: Payer: Self-pay | Admitting: Family Medicine

## 2016-10-15 DIAGNOSIS — G2581 Restless legs syndrome: Secondary | ICD-10-CM

## 2016-11-11 ENCOUNTER — Other Ambulatory Visit: Payer: Self-pay | Admitting: Family Medicine

## 2016-11-11 NOTE — Telephone Encounter (Signed)
Requesting: ALPRAZolam (XANAX) 1 MG tablet Contract:  UDS Last OV: 08/22/16 Last Refill: 08/19/16  Please Advise

## 2016-11-11 NOTE — Telephone Encounter (Signed)
[facility]: last filled a 30 days supply of alprazolam on 6/5, 5/8, 4/9,  3/13 No unexpected entries  Will refill for her

## 2016-11-12 ENCOUNTER — Other Ambulatory Visit: Payer: Self-pay | Admitting: Family Medicine

## 2016-11-15 ENCOUNTER — Other Ambulatory Visit: Payer: Self-pay | Admitting: Family Medicine

## 2016-12-11 ENCOUNTER — Other Ambulatory Visit: Payer: Self-pay | Admitting: Family Medicine

## 2016-12-12 ENCOUNTER — Telehealth: Payer: Self-pay | Admitting: Family Medicine

## 2016-12-12 DIAGNOSIS — G2581 Restless legs syndrome: Secondary | ICD-10-CM

## 2016-12-12 NOTE — Telephone Encounter (Signed)
°  Relation to LD:JTTS Call back number: [phone]  Pharmacy: [facility], [place] [phone] (Phone) [phone] (Fax)     Reason for call:  Patient states pramipexole (MIRAPEX) 0.25 MG tablet is not working and would like to try gabapentin, patient would like to discuss

## 2016-12-13 MED ORDER — GABAPENTIN 300 MG PO CAPS: ORAL_CAPSULE | ORAL | 6 refills | Status: DC

## 2016-12-13 NOTE — Telephone Encounter (Signed)
Called to discuss with Jodi Williams- she is taking up to 4 mirapex at bedtime but her RLS sx are getting worse.  She would like to try gabapentin which is what her dad took for her neuropathy This is a reasonable idea- will have her decrease her mirapex by 50% for 2 days, then may stop and change to gabapentin which I will rx for her

## 2016-12-17 ENCOUNTER — Other Ambulatory Visit: Payer: Self-pay | Admitting: Family Medicine

## 2016-12-17 DIAGNOSIS — G2581 Restless legs syndrome: Secondary | ICD-10-CM

## 2016-12-29 ENCOUNTER — Other Ambulatory Visit: Payer: Self-pay | Admitting: Family Medicine

## 2017-01-12 ENCOUNTER — Other Ambulatory Visit: Payer: Self-pay | Admitting: Family Medicine

## 2017-01-19 ENCOUNTER — Other Ambulatory Visit: Payer: Self-pay | Admitting: Family Medicine

## 2017-01-20 ENCOUNTER — Other Ambulatory Visit: Payer: Self-pay | Admitting: Emergency Medicine

## 2017-01-20 MED ORDER — METFORMIN HCL 500 MG PO TABS: ORAL_TABLET | ORAL | 3 refills | Status: DC

## 2017-01-20 MED ORDER — PRAVASTATIN SODIUM 20 MG PO TABS: ORAL_TABLET | ORAL | 3 refills | Status: DC

## 2017-01-20 MED ORDER — ALPRAZOLAM 1 MG PO TABS: 1.0000 mg | ORAL_TABLET | Freq: Every evening | ORAL | 1 refills | Status: DC | PRN

## 2017-01-20 NOTE — Telephone Encounter (Signed)
Last here in April  Lab Results  Component Value Date   HGBA1C 7.8 (H) 08/22/2016   A1c and lipids last done in April of this year  [place]: last filled her alprazolam on 8/5, nothing unexpected.   She needs an appt in the next couple of months Will refill her meds but will ask staff to schedule her

## 2017-01-20 NOTE — Telephone Encounter (Signed)
Received refill request for ALPRAZOLAM (XANAX) 1 MG tablet. Last office visit 08/22/16, and last refill 11/11/16.  Also requesting metformin, and pravastatin refills. Please advise.

## 2017-01-29 ENCOUNTER — Ambulatory Visit (INDEPENDENT_AMBULATORY_CARE_PROVIDER_SITE_OTHER): Payer: 59

## 2017-01-29 ENCOUNTER — Ambulatory Visit (INDEPENDENT_AMBULATORY_CARE_PROVIDER_SITE_OTHER): Payer: 59 | Admitting: Podiatry

## 2017-01-29 DIAGNOSIS — M722 Plantar fascial fibromatosis: Secondary | ICD-10-CM

## 2017-01-29 MED ORDER — BETAMETHASONE SOD PHOS & ACET 6 (3-3) MG/ML IJ SUSP: 3.0000 mg | Freq: Once | INTRAMUSCULAR | Status: DC

## 2017-01-30 ENCOUNTER — Other Ambulatory Visit (HOSPITAL_COMMUNITY): Payer: Self-pay | Admitting: Orthopedic Surgery

## 2017-01-30 DIAGNOSIS — T84033A Mechanical loosening of internal left knee prosthetic joint, initial encounter: Secondary | ICD-10-CM

## 2017-01-31 ENCOUNTER — Other Ambulatory Visit: Payer: Self-pay | Admitting: Emergency Medicine

## 2017-01-31 MED ORDER — MELOXICAM 7.5 MG PO TABS: 7.5000 mg | ORAL_TABLET | Freq: Every day | ORAL | 0 refills | Status: DC

## 2017-01-31 NOTE — Telephone Encounter (Signed)
Received refill request for meloxicam (MOBIC) 7.5 MG tablet from [facility]. Refill only approved for 30 days. Called pt to schedule appt per provider request. Pt scheduled for 02/24/17.

## 2017-02-02 NOTE — Progress Notes (Signed)
   Subjective: Patient is a 66 year old female with past medical history of T2DM presenting today as a new patient with a complaint of pain and tenderness in the left plantar heel that began about one month ago. Patient states that it hurts in the mornings with the first steps out of bed and walking barefoot also increases the pain. Patient presents today for further treatment and evaluation.  Past Medical History:  Diagnosis Date  . Allergy   . Anxiety   . Arthritis   . Asthma   . Cataract   . COPD (chronic obstructive pulmonary disease) ([place])   . Depression   . Diabetes mellitus ([place])   . Hyperlipidemia   . Hypertension   . Ulcer ([place])      Objective: Physical Exam General: The patient is alert and oriented x3 in no acute distress.  Dermatology: Skin is warm, dry and supple bilateral lower extremities. Negative for open lesions or macerations bilateral.   Vascular: Dorsalis Pedis and Posterior Tibial pulses palpable bilateral.  Capillary fill time is immediate to all digits.  Neurological: Epicritic and protective threshold intact bilateral.   Musculoskeletal: Tenderness to palpation at the medial calcaneal tubercale and through the insertion of the plantar fascia of the left foot. All other joints range of motion within normal limits bilateral. Strength 5/5 in all groups bilateral.   Radiographic exam:   Normal osseous mineralization. Joint spaces preserved. No fracture/dislocation/boney destruction. Calcaneal spur present with mild thickening of plantar fascia left. No other soft tissue abnormalities or radiopaque foreign bodies.   Assessment: 1. Plantar fasciitis left foot  Plan of Care:  1. Patient evaluated. Xrays reviewed.   2. Injection of 0.5cc Celestone soluspan injected into the left plantar fascia.  3. Plantar fascial band(s) dispensed. 4. Continue taking meloxicam 15 mg. 5. Instructed patient regarding therapies and modalities at home to alleviate symptoms.    6. Return to clinic in 4 weeks.     Edrick Kins, DPM [facility]  Dr. Edrick Kins, DPM    [address], [place] [address]                Office [phone]  Fax [phone]

## 2017-02-11 ENCOUNTER — Other Ambulatory Visit (HOSPITAL_COMMUNITY): Payer: Self-pay | Admitting: Orthopedic Surgery

## 2017-02-11 DIAGNOSIS — T84032A Mechanical loosening of internal right knee prosthetic joint, initial encounter: Secondary | ICD-10-CM

## 2017-02-11 DIAGNOSIS — T84033A Mechanical loosening of internal left knee prosthetic joint, initial encounter: Secondary | ICD-10-CM

## 2017-02-16 ENCOUNTER — Other Ambulatory Visit: Payer: Self-pay | Admitting: Family Medicine

## 2017-02-16 DIAGNOSIS — G2581 Restless legs syndrome: Secondary | ICD-10-CM

## 2017-02-18 IMAGING — NM NM MISC PROCEDURE
3 series · 18 of 18 positions shown · non-contrast
Comparison: none

[Series 1: wbr_s-proj_st stress_(id)_sa · 6.5mm · 6.51mm/px · 6 of 64 frames shown (1 of 2)]
[frame 6/64]
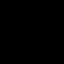
[frame 16/64]
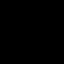
[frame 27/64]
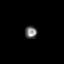
[frame 38/64]
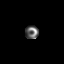
[frame 48/64]
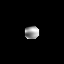
[frame 59/64]
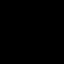

[Series 1: wbr_s-proj_st stress_(id)_sa · 6.5mm · 6.51mm/px · 6 of 512 frames shown (2 of 2)]
[frame 43/512]
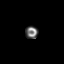
[frame 128/512]
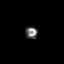
[frame 214/512]
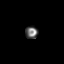
[frame 299/512]
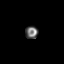
[frame 384/512]
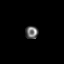
[frame 470/512]
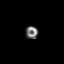

[Series 1: wbr_r-proj_st rest_(id)_sa · 6.5mm · 6.51mm/px · 6 of 64 frames shown]
[frame 6/64]
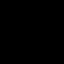
[frame 16/64]
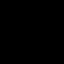
[frame 27/64]
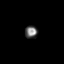
[frame 38/64]
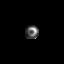
[frame 48/64]
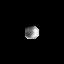
[frame 59/64]
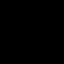

[18 of 18 positions shown; findings below may reference images not displayed]

Canned report from images found in remote index.

Refer to host system for actual result text.

## 2017-02-20 ENCOUNTER — Encounter (HOSPITAL_COMMUNITY)
Admission: RE | Admit: 2017-02-20 | Discharge: 2017-02-20 | Disposition: A | Discharge status: Home / Self Care | Payer: 59 | Source: Ambulatory Visit | Attending: Orthopedic Surgery | Admitting: Orthopedic Surgery

## 2017-02-20 DIAGNOSIS — T84032A Mechanical loosening of internal right knee prosthetic joint, initial encounter: Secondary | ICD-10-CM

## 2017-02-20 DIAGNOSIS — Z96652 Presence of left artificial knee joint: Secondary | ICD-10-CM | POA: Insufficient documentation

## 2017-02-20 DIAGNOSIS — T84033A Mechanical loosening of internal left knee prosthetic joint, initial encounter: Secondary | ICD-10-CM | POA: Diagnosis present

## 2017-02-20 MED ORDER — TECHNETIUM TC 99M MEDRONATE IV KIT
25.0000 | PACK | Freq: Once | INTRAVENOUS | Status: AC | PRN
  Administered 2017-02-20: 25 via INTRAVENOUS

## 2017-02-24 ENCOUNTER — Ambulatory Visit: Payer: 59 | Admitting: Family Medicine

## 2017-02-26 ENCOUNTER — Ambulatory Visit (INDEPENDENT_AMBULATORY_CARE_PROVIDER_SITE_OTHER): Payer: 59 | Admitting: Podiatry

## 2017-02-26 ENCOUNTER — Encounter (INDEPENDENT_AMBULATORY_CARE_PROVIDER_SITE_OTHER): Payer: Self-pay

## 2017-02-26 DIAGNOSIS — E0842 Diabetes mellitus due to underlying condition with diabetic polyneuropathy: Secondary | ICD-10-CM | POA: Diagnosis not present

## 2017-02-26 DIAGNOSIS — M722 Plantar fascial fibromatosis: Secondary | ICD-10-CM | POA: Diagnosis not present

## 2017-02-26 DIAGNOSIS — G5791 Unspecified mononeuropathy of right lower limb: Secondary | ICD-10-CM | POA: Diagnosis not present

## 2017-02-26 MED ORDER — MELOXICAM 15 MG PO TABS: 15.0000 mg | ORAL_TABLET | Freq: Every day | ORAL | 1 refills | Status: DC

## 2017-02-26 MED ORDER — BETAMETHASONE SOD PHOS & ACET 6 (3-3) MG/ML IJ SUSP: 3.0000 mg | Freq: Once | INTRAMUSCULAR | Status: DC

## 2017-02-27 LAB — HM DIABETES EYE EXAM

## 2017-03-02 NOTE — Progress Notes (Signed)
   Subjective: Patient is a 66 year old female with past medical history of T2DM presenting today for follow-up evaluation of left plantar fasciitis. She reports continued pain in the left foot after walking and standing. She [place] She also has a new complaint in the right foot. She states it feels like the plantar aspect is intermittently vibrating which began 2 weeks ago. She states the symptoms occur randomly and happen without stimulus. She presents today for further treatment and evaluation.   Past Medical History:  Diagnosis Date  . Allergy   . Anxiety   . Arthritis   . Asthma   . Cataract   . COPD (chronic obstructive pulmonary disease) ([place])   . Depression   . Diabetes mellitus ([place])   . Hyperlipidemia   . Hypertension   . Ulcer ([place])      Objective: Physical Exam General: The patient is alert and oriented x3 in no acute distress.  Dermatology: Skin is warm, dry and supple bilateral lower extremities. Negative for open lesions or macerations bilateral.   Vascular: Dorsalis Pedis and Posterior Tibial pulses palpable bilateral.  Capillary fill time is immediate to all digits.  Neurological: Epicritic and protective threshold intact bilateral.   Musculoskeletal: Tenderness to palpation at the medial calcaneal tubercale and through the insertion of the plantar fascia of the left foot. All other joints range of motion within normal limits bilateral. Strength 5/5 in all groups bilateral.    Assessment: 1. Plantar fasciitis left foot 2. DM with polyneuropathy right foot  Plan of Care:  1. Patient evaluated.   2. Injection of 0.5cc Celestone soluspan injected into the left plantar fascia.  3. Prescription for meloxicam 15 mg given to patient. 4. Appointment with Liliane Channel for custom molded orthotics. 5. Return to clinic in 4 weeks.     Edrick Kins, DPM [facility]  Dr. Edrick Kins, DPM    [address], [place] [address]                Office [phone]  Fax [phone]

## 2017-03-03 ENCOUNTER — Other Ambulatory Visit: Payer: Self-pay | Admitting: Family Medicine

## 2017-03-11 ENCOUNTER — Other Ambulatory Visit: Payer: Self-pay | Admitting: Family Medicine

## 2017-03-20 ENCOUNTER — Other Ambulatory Visit: Payer: Self-pay | Admitting: Family Medicine

## 2017-03-24 ENCOUNTER — Other Ambulatory Visit: Payer: Self-pay | Admitting: Emergency Medicine

## 2017-03-24 ENCOUNTER — Encounter: Payer: Self-pay | Admitting: Family Medicine

## 2017-03-24 MED ORDER — ALPRAZOLAM 1 MG PO TABS: 1.0000 mg | ORAL_TABLET | Freq: Every evening | ORAL | 1 refills | Status: DC | PRN

## 2017-03-24 NOTE — Telephone Encounter (Signed)
Requesting: ALPRAZolam (XANAX) 1 MG tablet Contract UDS Last OV: 08/22/16 Last Refill: 01/20/17  Please Advise

## 2017-03-24 NOTE — Telephone Encounter (Signed)
Last filled 10/10- ok to refill today Will call in for her  Time for visit- will send her a message

## 2017-03-26 ENCOUNTER — Ambulatory Visit: Payer: 59 | Admitting: Podiatry

## 2017-03-31 ENCOUNTER — Ambulatory Visit: Payer: 59 | Admitting: Podiatry

## 2017-03-31 ENCOUNTER — Encounter: Payer: Self-pay | Admitting: Neurology

## 2017-03-31 ENCOUNTER — Ambulatory Visit: Payer: 59 | Admitting: Neurology

## 2017-03-31 ENCOUNTER — Other Ambulatory Visit: Payer: 59 | Admitting: Orthotics

## 2017-03-31 VITALS — BP 154/78 | HR 76 | Ht 62.0 in | Wt 177.0 lb

## 2017-03-31 DIAGNOSIS — G43709 Chronic migraine without aura, not intractable, without status migrainosus: Secondary | ICD-10-CM | POA: Diagnosis not present

## 2017-03-31 DIAGNOSIS — R519 Headache, unspecified: Secondary | ICD-10-CM

## 2017-03-31 DIAGNOSIS — IMO0002 Reserved for concepts with insufficient information to code with codable children: Secondary | ICD-10-CM | POA: Insufficient documentation

## 2017-03-31 DIAGNOSIS — R51 Headache: Secondary | ICD-10-CM

## 2017-03-31 MED ORDER — NORTRIPTYLINE HCL 10 MG PO CAPS: 20.0000 mg | ORAL_CAPSULE | Freq: Every day | ORAL | 11 refills | Status: DC

## 2017-03-31 MED ORDER — RIZATRIPTAN BENZOATE 5 MG PO TBDP: 5.0000 mg | ORAL_TABLET | ORAL | 12 refills | Status: DC | PRN

## 2017-03-31 NOTE — Progress Notes (Signed)
PATIENT: Jodi Williams DOB: 27-Feb-1951  Chief Complaint  Patient presents with  . New Patient (Initial Visit)    PCP: Dr. Anderson Malta Copland     HISTORICAL  Jodi Williams is a 66 year old female, seen in refer by her primary care doctor  Copland, Janett Billow for evaluation of headache, initial evaluation was on March 31 2017.  She reported a history of headaches since high school, lateralized severe pounding headache with associated light noise sensitivity, nauseous, movement made it worse,  Over the years, she has been taking up to 10 BC powder, eventually developed gastric ulcer, now she has almost daily bilateral frontal pressure headaches, few times each week, we would exacerbated to a more severe headache, she has to take multiple dose of over-the-counter sinus medications, and Xanax to go to sleep, she has been taking [employer] brand sinus medication which is a combination of Tylenol, phenylephrine, guaifenesin Overpass 5 6 years,  Trigger for her migraines are bright light, stress, weather change, hungry, she also has mild gait abnormality due to bilateral knee pain, diffuse body achy pain.  REVIEW OF SYSTEMS: Full 14 system review of systems performed and notable only for blurred vision, headaches, restless leg, joint pain, achy muscles, allergies  ALLERGIES: Allergies  Allergen Reactions  . Omnicef [Cefdinir] Rash    HOME MEDICATIONS: Current Outpatient Medications  Medication Sig Dispense Refill  . acetaminophen (TYLENOL 8 HOUR ARTHRITIS PAIN) 650 MG CR tablet Take 650 mg by mouth every 8 (eight) hours as needed for pain.    Marland Kitchen albuterol (PROVENTIL HFA;VENTOLIN HFA) 108 (90 BASE) MCG/ACT inhaler Inhale 2 puffs into the lungs every 4 (four) hours as needed for wheezing or shortness of breath (cough, shortness of breath or wheezing.). 1 Inhaler 1  . ALPRAZolam (XANAX) 1 MG tablet Take 1 tablet (1 mg total) at bedtime as needed by mouth. for sleep 30 tablet 1  . amoxicillin  (AMOXIL) 500 MG capsule Take 4 capsules prior to dental appointment for cleanings.  2  . citalopram (CELEXA) 20 MG tablet TAKE 2 TABLETS(40 MG) BY MOUTH DAILY 60 tablet 0  . Dulaglutide (TRULICITY) 7.42 VZ/5.6LO SOPN Inject 0.75 mg into the skin once a week. 4 pen 11  . gabapentin (NEURONTIN) 300 MG capsule Start with 1 cap at bedtime for restless legs.  May titrate up to three times a day gradually if needed 90 capsule 6  . lisinopril-hydrochlorothiazide (PRINZIDE,ZESTORETIC) 10-12.5 MG tablet TAKE 1 TABLET BY MOUTH DAILY 90 tablet 0  . meloxicam (MOBIC) 15 MG tablet Take 1 tablet (15 mg total) by mouth daily. 60 tablet 1  . meloxicam (MOBIC) 7.5 MG tablet TAKE 1 TABLET BY MOUTH DAILY 30 tablet 0  . metFORMIN (GLUCOPHAGE) 500 MG tablet TAKE 1 TABLET BY MOUTH TWICE DAILY WITH MEALS( NEEDS APPOINTMENT) 180 tablet 3  . methocarbamol (ROBAXIN) 500 MG tablet Take 1 tablet (500 mg total) by mouth daily. Use if needed for leg pain 30 tablet 1  . omeprazole (PRILOSEC) 40 MG capsule TAKE ONE CAPSULE BY MOUTH DAILY 30 capsule 0  . pramipexole (MIRAPEX) 0.25 MG tablet TAKE 1 TABLET BY MOUTH EVERY EVENING AS NEEDED FOR LEG PAIN. MAY TAKE 2ND TABLET IF NEEDED 180 tablet 0  . pravastatin (PRAVACHOL) 20 MG tablet TAKE 1 TABLET BY MOUTH DAILY. MAY TAKE EVERY 2ND OR 3RD DAY IF ACHES 90 tablet 3  . XIIDRA 5 % SOLN INT 1 GTT INTO OU BID  3   Current Facility-Administered Medications  Medication Dose  Route Frequency Provider Last Rate Last Dose  . betamethasone acetate-betamethasone sodium phosphate (CELESTONE) injection 3 mg  3 mg Intramuscular Once Daylene Katayama M, DPM      . betamethasone acetate-betamethasone sodium phosphate (CELESTONE) injection 3 mg  3 mg Intramuscular Once Edrick Kins, DPM        PAST MEDICAL HISTORY: Past Medical History:  Diagnosis Date  . Allergy   . Anxiety   . Arthritis   . Asthma   . Cataract   . COPD (chronic obstructive pulmonary disease) ([place])   . Depression   .  Diabetes mellitus ([place])   . Hyperlipidemia   . Hypertension   . Ulcer     PAST SURGICAL HISTORY: Past Surgical History:  Procedure Laterality Date  . BACK SURGERY    . BREAST SURGERY    . EYE SURGERY     cataract  . HAND SURGERY    . KNEE SURGERY      FAMILY HISTORY: Family History  Problem Relation Age of Onset  . Hyperlipidemia Mother   . Hypertension Mother   . Heart disease Mother   . Dementia Mother   . Arthritis Mother   . Neuropathy Father   . Diabetes Father   . Kidney disease Father     SOCIAL HISTORY:  Social History   Socioeconomic History  . Marital status: Married    Spouse name: Not on file  . Number of children: 1  . Years of education: Not on file  . Highest education level: Not on file  Social Needs  . Financial resource strain: Not on file  . Food insecurity - worry: Not on file  . Food insecurity - inability: Not on file  . Transportation needs - medical: Not on file  . Transportation needs - non-medical: Not on file  Occupational History  . Occupation: Electrical engineer  Tobacco Use  . Smoking status: Never Smoker  . Smokeless tobacco: Never Used  Substance and Sexual Activity  . Alcohol use: Yes    Alcohol/week: 0.0 oz    Comment: Rare  . Drug use: No  . Sexual activity: Yes    Birth control/protection: None  Other Topics Concern  . Not on file  Social History Narrative  . Not on file     PHYSICAL EXAM   Vitals:   03/31/17 0850  BP: (!) 154/78  Pulse: 76  Weight: 177 lb (80.3 kg)  Height: 5' 2"  (1.575 m)    Not recorded      Body mass index is 32.37 kg/m.  PHYSICAL EXAMNIATION:  Gen: NAD, conversant, well nourised, obese, well groomed                     Cardiovascular: Regular rate rhythm, no peripheral edema, warm, nontender. Eyes: Conjunctivae clear without exudates or hemorrhage Neck: Supple, no carotid bruits. Pulmonary: Clear to auscultation bilaterally   NEUROLOGICAL EXAM:  MENTAL STATUS: Speech:     Speech is normal; fluent and spontaneous with normal comprehension.  Cognition:     Orientation to time, place and person     Normal recent and remote memory     Normal Attention span and concentration     Normal Language, naming, repeating,spontaneous speech     Fund of knowledge   CRANIAL NERVES: CN II: Visual fields are full to confrontation. Fundoscopic exam is normal with sharp discs and no vascular changes. Pupils are round equal and briskly reactive to light. CN III, IV, VI: extraocular  movement are normal. No ptosis. CN V: Facial sensation is intact to pinprick in all 3 divisions bilaterally. Corneal responses are intact.  CN VII: Face is symmetric with normal eye closure and smile. CN VIII: Hearing is normal to rubbing fingers CN IX, X: Palate elevates symmetrically. Phonation is normal. CN XI: Head turning and shoulder shrug are intact CN XII: Tongue is midline with normal movements and no atrophy.  MOTOR: There is no pronator drift of out-stretched arms. Muscle bulk and tone are normal. Muscle strength is normal.  REFLEXES: Reflexes are 2+ and symmetric at the biceps, triceps, knees, and ankles. Plantar responses are flexor.  SENSORY: Intact to light touch, pinprick, positional sensation and vibratory sensation are intact in fingers and toes.  COORDINATION: Rapid alternating movements and fine finger movements are intact. There is no dysmetria on finger-to-nose and heel-knee-shin.    GAIT/STANCE: Posture is normal. Gait is steady with normal steps, base, arm swing, and turning. Heel and toe walking are normal. Tandem gait is normal.  Romberg is absent.   DIAGNOSTIC DATA (LABS, IMAGING, TESTING) - I reviewed patient records, labs, notes, testing and imaging myself where available.   ASSESSMENT AND PLAN  Jodi Williams is a 66 y.o. female   Chronic migraine headaches,  Also suggestive medication overuse headache   Start preventive medications nortriptyline 10 mg  titrating to 20 mg every night   Maxalt as needed   Stopped daily use of over-the-counter medications  Chronic daily headaches  MRI of the brain to rule out structural relation  ESR C-reactive protein  Marcial Pacas, M.D. Ph.D.  [facility] [address] [place], [place] [address] Ph: [phone] Fax: [phone]  CC: Copland, Gay Filler, MD

## 2017-04-01 LAB — C-REACTIVE PROTEIN: CRP: 5.1 mg/L — AB (ref 0.0–4.9)

## 2017-04-01 LAB — SEDIMENTATION RATE: SED RATE: 13 mm/h (ref 0–40)

## 2017-04-03 NOTE — Progress Notes (Addendum)
[place] at [facility] [address] [place], [place] [address] [phone]  Date:  04/07/2017   Name:  Jodi Williams   DOB:  July 28, 1950   MRN:  [id number]  PCP:  Darreld Mclean, MD    Chief Complaint: Follow-up (Pt here for f/u visit. Would like flu vaccine today. )   History of Present Illness:  Jodi Williams is a 66 y.o. very pleasant female patient who presents with the following:  Here today for a follow-up visit/ CPE History of DM, HTN, asthma She is on trulicity and metformin for her DM For BP is is on prinzide celexa and xanax for mood She notes that her mood and anxiety have been pretty good - she is doing well in this regard No falls over the last year She is still taking her omeprazole - she tried to stop taking this but her sx returned  Last visit here in April as follows:  Here today to discuss a couple of concerns She notes a history of frequent HA.  "I keep a nagging headache all the time." states that she has had a constant headache for 20 - 30 years.  She got worried because a friend was recently dx with a brain tumor.  She will use tylenol as needed but does not feel that it really helps. She used to give herself some sort of shot 20 years ago "that would knock me out" but she is not sure exactly what this was. She has been seen by a headache clinic/ neurology at some point but this was 30 years ago.   Apparently they told her that she "would end up with MS" per her MRI.  She never followed up.   Also she is frustrated by difficulty losing weight, and wonders about increasing her xanax dose She is on xanax 1mg  at bedtime for sleep- I refilled this for her about 10 days ago States that she uses the xanax to sleep because her headaches keep her up at night History of HTN, diabetes, high cholesterol. She had her left knee replaced a year ago, and the right knee last fall. She sees Aluisio- he did an injection to  her left knee last week due to  She has been doing weight watchers, and also just recently started a low carb diet.  Wonders about various weight loss drugs  Lab Results  Component Value Date   HGBA1C 7.8 (H) 08/22/2016   She saw neurology for her headaches- Dr. Krista Blue, most recently saw her about a week ago.   Her HA are thought to be migraine in nature and also due to OTC medication overuse.  They are titrating nortriptyline for prevention and [place] using maxalt prn.    She did not start the nortriptyline just yet but plans to do so today, MRI is being done on Wednesday  Foot exam due Mammo: per her GYN, Dr. Gita Kudo Colon:  She would like to do cologuard  Flu: today A1c is due- can do full labs today as well. Fasting labs Pap: 2016- she is updating this tomorrow, per her GYN  She is checking her blood sugar every couple of days and brings in a list of her FBG.  Her glucose is generally under 150- occasionally a bit higher  She will start on medicare in February  BP Readings from Last 3 Encounters:  04/07/17 124/78  03/31/17 (!) 154/78  08/22/16 118/69  Wt Readings from Last 3 Encounters:  04/07/17 172 lb 12.8 oz (78.4 kg)  03/31/17 177 lb (80.3 kg)  08/22/16 169 lb 9.6 oz (76.9 kg)     Patient Active Problem List   Diagnosis Date Noted  . Chronic migraine 03/31/2017  . Preop cardiovascular exam 07/12/2015  . Dyspnea 07/12/2015  . Chest pain 07/12/2015  . Restless legs 05/24/2015  . Diabetes ([place]) 08/24/2013  . Other and unspecified hyperlipidemia 09/07/2012  . HTN (hypertension) 04/12/2012  . OA (osteoarthritis) of knee 04/12/2012  . 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 Depression with anxiety 04/12/2012  . H/O gastric ulcer 04/12/2012  . ALLERGIC RHINITIS WITH CONJUNCTIVITIS 09/08/2007  . ASTHMA 07/20/2007    Past Medical History:  Diagnosis Date  . Allergy   . Anxiety   . Arthritis   . Asthma   . Cataract   . COPD (chronic obstructive pulmonary disease) ([place])   .  Depression   . Diabetes mellitus ([place])   . Hyperlipidemia   . Hypertension   . Ulcer     Past Surgical History:  Procedure Laterality Date  . BACK SURGERY    . BREAST SURGERY    . EYE SURGERY     cataract  . HAND SURGERY    . KNEE SURGERY      Social History   Tobacco Use  . Smoking status: Never Smoker  . Smokeless tobacco: Never Used  Substance Use Topics  . Alcohol use: Yes    Alcohol/week: 0.0 oz    Comment: Rare  . Drug use: No    Family History  Problem Relation Age of Onset  . Hyperlipidemia Mother   . Hypertension Mother   . Heart disease Mother   . Dementia Mother   . Arthritis Mother   . Neuropathy Father   . Diabetes Father   . Kidney disease Father     Allergies  Allergen Reactions  . Omnicef [Cefdinir] Rash    Medication list has been reviewed and updated.  Current Outpatient Medications on File Prior to Visit  Medication Sig Dispense Refill  . acetaminophen (TYLENOL 8 HOUR ARTHRITIS PAIN) 650 MG CR tablet Take 650 mg by mouth every 8 (eight) hours as needed for pain.    Marland Kitchen albuterol (PROVENTIL HFA;VENTOLIN HFA) 108 (90 BASE) MCG/ACT inhaler Inhale 2 puffs into the lungs every 4 (four) hours as needed for wheezing or shortness of breath (cough, shortness of breath or wheezing.). 1 Inhaler 1  . ALPRAZolam (XANAX) 1 MG tablet Take 1 tablet (1 mg total) at bedtime as needed by mouth. for sleep 30 tablet 1  . amoxicillin (AMOXIL) 500 MG capsule Take 4 capsules prior to dental appointment for cleanings.  2  . citalopram (CELEXA) 20 MG tablet TAKE 2 TABLETS(40 MG) BY MOUTH DAILY 60 tablet 0  . Dulaglutide (TRULICITY) 6.76 PP/5.0DT SOPN Inject 0.75 mg into the skin once a week. 4 pen 11  . gabapentin (NEURONTIN) 300 MG capsule Start with 1 cap at bedtime for restless legs.  May titrate up to three times a day gradually if needed 90 capsule 6  . lisinopril-hydrochlorothiazide (PRINZIDE,ZESTORETIC) 10-12.5 MG tablet TAKE 1 TABLET BY MOUTH DAILY 90 tablet 0   . meloxicam (MOBIC) 15 MG tablet Take 1 tablet (15 mg total) by mouth daily. 60 tablet 1  . metFORMIN (GLUCOPHAGE) 500 MG tablet TAKE 1 TABLET BY MOUTH TWICE DAILY WITH MEALS( NEEDS APPOINTMENT) 180 tablet 3  . methocarbamol (ROBAXIN) 500 MG tablet Take 1 tablet (500  mg total) by mouth daily. Use if needed for leg pain 30 tablet 1  . nortriptyline (PAMELOR) 10 MG capsule Take 2 capsules (20 mg total) at bedtime by mouth. 60 capsule 11  . omeprazole (PRILOSEC) 40 MG capsule TAKE ONE CAPSULE BY MOUTH DAILY 30 capsule 0  . pramipexole (MIRAPEX) 0.25 MG tablet TAKE 1 TABLET BY MOUTH EVERY EVENING AS NEEDED FOR LEG PAIN. MAY TAKE 2ND TABLET IF NEEDED 180 tablet 0  . pravastatin (PRAVACHOL) 20 MG tablet TAKE 1 TABLET BY MOUTH DAILY. MAY TAKE EVERY 2ND OR 3RD DAY IF ACHES 90 tablet 3  . rizatriptan (MAXALT-MLT) 5 MG disintegrating tablet Take 1 tablet (5 mg total) as needed by mouth. May repeat in 2 hours if needed 15 tablet 12  . XIIDRA 5 % SOLN INT 1 GTT INTO OU BID  3  . meloxicam (MOBIC) 7.5 MG tablet TAKE 1 TABLET BY MOUTH DAILY (Patient not taking: Reported on 04/07/2017) 30 tablet 0   No current facility-administered medications on file prior to visit.     Review of Systems:  As per HPI- otherwise negative. No CP or SOB with exercise- she is riding her indoor bike for 30- 45 minutes when she can She still has knee pain- is seeing Dr. Darryl Lent for this issue    Physical Examination: Vitals:   04/07/17 0839  BP: 124/78  Pulse: 86  Temp: 98.1 F (36.7 C)  SpO2: 98%   Vitals:   04/07/17 0839  Weight: 172 lb 12.8 oz (78.4 kg)  Height: 5\' 2"  (1.575 m)   Body mass index is 31.61 kg/m. Ideal Body Weight: Weight in (lb) to have BMI = 25: 136.4  GEN: WDWN, NAD, Non-toxic, A & O x 3, overweight, looks well HEENT: Atraumatic, Normocephalic. Neck supple. No masses, No LAD.  Bilateral TM wnl, oropharynx normal.  PEERL,EOMI.   Ears and Nose: No external deformity. CV: RRR, No M/G/R. No  JVD. No thrill. No extra heart sounds. PULM: CTA B, no wheezes, crackles, rhonchi. No retractions. No resp. distress. No accessory muscle use. ABD: S, NT, ND, +BS. No rebound. No HSM. EXTR: No c/c/e NEURO Normal gait.  PSYCH: Normally interactive. Conversant. Not depressed or anxious appearing.  Calm demeanor.    Assessment and Plan: Physical exam  Controlled type 2 diabetes mellitus without complication, without long-term current use of insulin ([place]) - Plan: Hemoglobin A1c, Comprehensive metabolic panel  Dyslipidemia - Plan: Lipid panel  Essential hypertension, benign - Plan: CBC  Medication monitoring encounter  Immunization due - Plan: Flu Vaccine QUAD 36+ mos IM (Fluarix & Fluzone Quad PF  CPE today Flu shot done Await labs Her weight is stable- she continues to try and lose BP is well controlled She is seeing her GYN tomorrow Ordered cologuard for her today   Signed Lamar Blinks, MD  Received her labs 11/27- message to pt.  She has not been able to tolerate higher doses of statin.  ?add Vascepa for her.     Blood count looks fine A1c shows ok control.  I would like to see it more about 6.5% .. .  Please use moderation over the holiday season, which is always a challenge!  Metabolic profile is fine Your cholesterol is not as good as I would like- however I remember that you have some trouble tolerating statin drugs so we likely cannot increase your dose.  I was thinking of adding a prescription omega 3 called Vascepa - this may help to lower your triglycerides  and bring your cholesterol into better balance.  Would this be ok with you?  Results for orders placed or performed in visit on 04/07/17  CBC  Result Value Ref Range   WBC 6.9 4.0 - 10.5 K/uL   RBC 4.75 3.87 - 5.11 Mil/uL   Platelets 246.0 150.0 - 400.0 K/uL   Hemoglobin 12.2 12.0 - 15.0 g/dL   HCT 38.4 36.0 - 46.0 %   MCV 80.8 78.0 - 100.0 fl   MCHC 31.7 30.0 - 36.0 g/dL   RDW 16.5 (H) 11.5 - 15.5 %   Hemoglobin A1c  Result Value Ref Range   Hgb A1c MFr Bld 7.2 (H) 4.6 - 6.5 %  Lipid panel  Result Value Ref Range   Cholesterol 228 (H) 0 - 200 mg/dL   Triglycerides 257.0 (H) 0.0 - 149.0 mg/dL   HDL 49.90 >39.00 mg/dL   VLDL 51.4 (H) 0.0 - 40.0 mg/dL   Total CHOL/HDL Ratio 5    NonHDL 178.55   Comprehensive metabolic panel  Result Value Ref Range   Sodium 141 135 - 145 mEq/L   Potassium 4.6 3.5 - 5.1 mEq/L   Chloride 102 96 - 112 mEq/L   CO2 30 19 - 32 mEq/L   Glucose, Bld 140 (H) 70 - 99 mg/dL   BUN 17 6 - 23 mg/dL   Creatinine, Ser 0.87 0.40 - 1.20 mg/dL   Total Bilirubin 0.4 0.2 - 1.2 mg/dL   Alkaline Phosphatase 71 39 - 117 U/L   AST 17 0 - 37 U/L   ALT 16 0 - 35 U/L   Total Protein 7.4 6.0 - 8.3 g/dL   Albumin 4.4 3.5 - 5.2 g/dL   Calcium 9.9 8.4 - 10.5 mg/dL   GFR 69.50 >60.00 mL/min  LDL cholesterol, direct  Result Value Ref Range   Direct LDL 136.0 mg/dL

## 2017-04-06 ENCOUNTER — Other Ambulatory Visit: Payer: Self-pay | Admitting: Family Medicine

## 2017-04-07 ENCOUNTER — Encounter: Payer: Self-pay | Admitting: Family Medicine

## 2017-04-07 ENCOUNTER — Ambulatory Visit: Payer: 59 | Admitting: Family Medicine

## 2017-04-07 VITALS — BP 124/78 | HR 86 | Temp 98.1°F | Ht 62.0 in | Wt 172.8 lb

## 2017-04-07 DIAGNOSIS — Z5181 Encounter for therapeutic drug level monitoring: Secondary | ICD-10-CM | POA: Diagnosis not present

## 2017-04-07 DIAGNOSIS — Z0000 Encounter for general adult medical examination without abnormal findings: Secondary | ICD-10-CM | POA: Diagnosis not present

## 2017-04-07 DIAGNOSIS — I10 Essential (primary) hypertension: Secondary | ICD-10-CM | POA: Diagnosis not present

## 2017-04-07 DIAGNOSIS — E785 Hyperlipidemia, unspecified: Secondary | ICD-10-CM | POA: Diagnosis not present

## 2017-04-07 DIAGNOSIS — E119 Type 2 diabetes mellitus without complications: Secondary | ICD-10-CM

## 2017-04-07 DIAGNOSIS — Z23 Encounter for immunization: Secondary | ICD-10-CM | POA: Diagnosis not present

## 2017-04-07 LAB — COMPREHENSIVE METABOLIC PANEL
ALBUMIN: 4.4 g/dL (ref 3.5–5.2)
ALK PHOS: 71 U/L (ref 39–117)
ALT: 16 U/L (ref 0–35)
AST: 17 U/L (ref 0–37)
BUN: 17 mg/dL (ref 6–23)
CO2: 30 mEq/L (ref 19–32)
Calcium: 9.9 mg/dL (ref 8.4–10.5)
Chloride: 102 mEq/L (ref 96–112)
Creatinine, Ser: 0.87 mg/dL (ref 0.40–1.20)
GFR: 69.5 mL/min (ref >60.00)
Glucose, Bld: 140 mg/dL — ABNORMAL HIGH (ref 70–99)
POTASSIUM: 4.6 meq/L (ref 3.5–5.1)
Sodium: 141 mEq/L (ref 135–145)
TOTAL PROTEIN: 7.4 g/dL (ref 6.0–8.3)
Total Bilirubin: 0.4 mg/dL (ref 0.2–1.2)

## 2017-04-07 LAB — CBC
HCT: 38.4 % (ref 36.0–46.0)
Hemoglobin: 12.2 g/dL (ref 12.0–15.0)
MCHC: 31.7 g/dL (ref 30.0–36.0)
MCV: 80.8 fl (ref 78.0–100.0)
Platelets: 246 10*3/uL (ref 150.0–400.0)
RBC: 4.75 Mil/uL (ref 3.87–5.11)
RDW: 16.5 % — AB (ref 11.5–15.5)
WBC: 6.9 10*3/uL (ref 4.0–10.5)

## 2017-04-07 LAB — LIPID PANEL
CHOL/HDL RATIO: 5
CHOLESTEROL: 228 mg/dL — AB (ref 0–200)
HDL: 49.9 mg/dL (ref >39.00)
NonHDL: 178.55
Triglycerides: 257 mg/dL — ABNORMAL HIGH (ref 0.0–149.0)
VLDL: 51.4 mg/dL — ABNORMAL HIGH (ref 0.0–40.0)

## 2017-04-07 LAB — HEMOGLOBIN A1C: HEMOGLOBIN A1C: 7.2 % — AB (ref 4.6–6.5)

## 2017-04-07 LAB — LDL CHOLESTEROL, DIRECT: LDL DIRECT: 136 mg/dL

## 2017-04-07 NOTE — Patient Instructions (Signed)
Always a pleasure to see you!   I will be in touch with your labs asap You got your flu shot today-  We will order cologugard for you to check for evidence of colon cancer  I hope that your headaches get better We will update your pneumonia series at age 66 and you will be due to the Shingrix shingles vaccine in 2021!    Health Maintenance for Postmenopausal Women Menopause is a normal process in which your reproductive ability comes to an end. This process happens gradually over a span of months to years, usually between the ages of 59 and 82. Menopause is complete when you have missed 12 consecutive menstrual periods. It is important to talk with your health care provider about some of the most common conditions that affect postmenopausal women, such as heart disease, cancer, and bone loss (osteoporosis). Adopting a healthy lifestyle and getting preventive care can help to promote your health and wellness. Those actions can also lower your chances of developing some of these common conditions. What should I know about menopause? During menopause, you may experience a number of symptoms, such as:  Moderate-to-severe hot flashes.  Night sweats.  Decrease in sex drive.  Mood swings.  Headaches.  Tiredness.  Irritability.  Memory problems.  Insomnia.  Choosing to treat or not to treat menopausal changes is an individual decision that you make with your health care provider. What should I know about hormone replacement therapy and supplements? Hormone therapy products are effective for treating symptoms that are associated with menopause, such as hot flashes and night sweats. Hormone replacement carries certain risks, especially as you become older. If you are thinking about using estrogen or estrogen with progestin treatments, discuss the benefits and risks with your health care provider. What should I know about heart disease and stroke? Heart disease, heart attack, and stroke  become more likely as you age. This may be due, in part, to the hormonal changes that your body experiences during menopause. These can affect how your body processes dietary fats, triglycerides, and cholesterol. Heart attack and stroke are both medical emergencies. There are many things that you can do to help prevent heart disease and stroke:  Have your blood pressure checked at least every 1-2 years. High blood pressure causes heart disease and increases the risk of stroke.  If you are 61-56 years old, ask your health care provider if you should take aspirin to prevent a heart attack or a stroke.  Do not use any tobacco products, including cigarettes, chewing tobacco, or electronic cigarettes. If you need help quitting, ask your health care provider.  It is important to eat a healthy diet and maintain a healthy weight. ? Be sure to include plenty of vegetables, fruits, low-fat dairy products, and lean protein. ? Avoid eating foods that are high in solid fats, added sugars, or salt (sodium).  Get regular exercise. This is one of the most important things that you can do for your health. ? Try to exercise for at least 150 minutes each week. The type of exercise that you do should increase your heart rate and make you sweat. This is known as moderate-intensity exercise. ? Try to do strengthening exercises at least twice each week. Do these in addition to the moderate-intensity exercise.  Know your numbers.Ask your health care provider to check your cholesterol and your blood glucose. Continue to have your blood tested as directed by your health care provider.  What should I know about  cancer screening? There are several types of cancer. Take the following steps to reduce your risk and to catch any cancer development as early as possible. Breast Cancer  Practice breast self-awareness. ? This means understanding how your breasts normally appear and feel. ? It also means doing regular breast  self-exams. Let your health care provider know about any changes, no matter how small.  If you are 56 or older, have a clinician do a breast exam (clinical breast exam or CBE) every year. Depending on your age, family history, and medical history, it may be recommended that you also have a yearly breast X-ray (mammogram).  If you have a family history of breast cancer, talk with your health care provider about genetic screening.  If you are at high risk for breast cancer, talk with your health care provider about having an MRI and a mammogram every year.  Breast cancer (BRCA) gene test is recommended for women who have family members with BRCA-related cancers. Results of the assessment will determine the need for genetic counseling and BRCA1 and for BRCA2 testing. BRCA-related cancers include these types: ? Breast. This occurs in males or females. ? Ovarian. ? Tubal. This may also be called fallopian tube cancer. ? Cancer of the abdominal or pelvic lining (peritoneal cancer). ? Prostate. ? Pancreatic.  Cervical, Uterine, and Ovarian Cancer Your health care provider may recommend that you be screened regularly for cancer of the pelvic organs. These include your ovaries, uterus, and vagina. This screening involves a pelvic exam, which includes checking for microscopic changes to the surface of your cervix (Pap test).  For women ages 21-65, health care providers may recommend a pelvic exam and a Pap test every three years. For women ages 71-65, they may recommend the Pap test and pelvic exam, combined with testing for human papilloma virus (HPV), every five years. Some types of HPV increase your risk of cervical cancer. Testing for HPV may also be done on women of any age who have unclear Pap test results.  Other health care providers may not recommend any screening for nonpregnant women who are considered low risk for pelvic cancer and have no symptoms. Ask your health care provider if a screening  pelvic exam is right for you.  If you have had past treatment for cervical cancer or a condition that could lead to cancer, you need Pap tests and screening for cancer for at least 20 years after your treatment. If Pap tests have been discontinued for you, your risk factors (such as having a new sexual partner) need to be reassessed to determine if you should start having screenings again. Some women have medical problems that increase the chance of getting cervical cancer. In these cases, your health care provider may recommend that you have screening and Pap tests more often.  If you have a family history of uterine cancer or ovarian cancer, talk with your health care provider about genetic screening.  If you have vaginal bleeding after reaching menopause, tell your health care provider.  There are currently no reliable tests available to screen for ovarian cancer.  Lung Cancer Lung cancer screening is recommended for adults 59-9 years old who are at high risk for lung cancer because of a history of smoking. A yearly low-dose CT scan of the lungs is recommended if you:  Currently smoke.  Have a history of at least 30 pack-years of smoking and you currently smoke or have quit within the past 15 years. A pack-year is  smoking an average of one pack of cigarettes per day for one year.  Yearly screening should:  Continue until it has been 15 years since you quit.  Stop if you develop a health problem that would prevent you from having lung cancer treatment.  Colorectal Cancer  This type of cancer can be detected and can often be prevented.  Routine colorectal cancer screening usually begins at age 85 and continues through age 54.  If you have risk factors for colon cancer, your health care provider may recommend that you be screened at an earlier age.  If you have a family history of colorectal cancer, talk with your health care provider about genetic screening.  Your health care  provider may also recommend using home test kits to check for hidden blood in your stool.  A small camera at the end of a tube can be used to examine your colon directly (sigmoidoscopy or colonoscopy). This is done to check for the earliest forms of colorectal cancer.  Direct examination of the colon should be repeated every 5-10 years until age 30. However, if early forms of precancerous polyps or small growths are found or if you have a family history or genetic risk for colorectal cancer, you may need to be screened more often.  Skin Cancer  Check your skin from head to toe regularly.  Monitor any moles. Be sure to tell your health care provider: ? About any new moles or changes in moles, especially if there is a change in a mole's shape or color. ? If you have a mole that is larger than the size of a pencil eraser.  If any of your family members has a history of skin cancer, especially at a young age, talk with your health care provider about genetic screening.  Always use sunscreen. Apply sunscreen liberally and repeatedly throughout the day.  Whenever you are outside, protect yourself by wearing long sleeves, pants, a wide-brimmed hat, and sunglasses.  What should I know about osteoporosis? Osteoporosis is a condition in which bone destruction happens more quickly than new bone creation. After menopause, you may be at an increased risk for osteoporosis. To help prevent osteoporosis or the bone fractures that can happen because of osteoporosis, the following is recommended:  If you are 37-50 years old, get at least 1,000 mg of calcium and at least 600 mg of vitamin D per day.  If you are older than age 105 but younger than age 89, get at least 1,200 mg of calcium and at least 600 mg of vitamin D per day.  If you are older than age 49, get at least 1,200 mg of calcium and at least 800 mg of vitamin D per day.  Smoking and excessive alcohol intake increase the risk of osteoporosis. Eat  foods that are rich in calcium and vitamin D, and do weight-bearing exercises several times each week as directed by your health care provider. What should I know about how menopause affects my mental health? Depression may occur at any age, but it is more common as you become older. Common symptoms of depression include:  Low or sad mood.  Changes in sleep patterns.  Changes in appetite or eating patterns.  Feeling an overall lack of motivation or enjoyment of activities that you previously enjoyed.  Frequent crying spells.  Talk with your health care provider if you think that you are experiencing depression. What should I know about immunizations? It is important that you get and maintain  your immunizations. These include:  Tetanus, diphtheria, and pertussis (Tdap) booster vaccine.  Influenza every year before the flu season begins.  Pneumonia vaccine.  Shingles vaccine.  Your health care provider may also recommend other immunizations. This information is not intended to replace advice given to you by your health care provider. Make sure you discuss any questions you have with your health care provider. Document Released: 06/21/2005 Document Revised: 11/17/2015 Document Reviewed: 01/31/2015 Elsevier Interactive Patient Education  2018 [employer].

## 2017-04-08 ENCOUNTER — Encounter: Payer: Self-pay | Admitting: Family Medicine

## 2017-04-08 ENCOUNTER — Other Ambulatory Visit: Payer: Self-pay | Admitting: Emergency Medicine

## 2017-04-08 MED ORDER — MELOXICAM 7.5 MG PO TABS: 7.5000 mg | ORAL_TABLET | Freq: Every day | ORAL | 0 refills | Status: DC

## 2017-04-09 ENCOUNTER — Ambulatory Visit: Payer: 59

## 2017-04-09 DIAGNOSIS — R51 Headache: Secondary | ICD-10-CM | POA: Diagnosis not present

## 2017-04-09 DIAGNOSIS — R519 Headache, unspecified: Secondary | ICD-10-CM

## 2017-04-22 ENCOUNTER — Other Ambulatory Visit: Payer: Self-pay | Admitting: Family Medicine

## 2017-05-06 ENCOUNTER — Other Ambulatory Visit: Payer: Self-pay | Admitting: Family Medicine

## 2017-05-19 ENCOUNTER — Encounter: Payer: Self-pay | Admitting: Neurology

## 2017-05-20 ENCOUNTER — Other Ambulatory Visit: Payer: Self-pay | Admitting: Family Medicine

## 2017-05-20 DIAGNOSIS — G2581 Restless legs syndrome: Secondary | ICD-10-CM

## 2017-05-21 ENCOUNTER — Other Ambulatory Visit: Payer: Self-pay | Admitting: Emergency Medicine

## 2017-05-21 MED ORDER — CITALOPRAM HYDROBROMIDE 20 MG PO TABS: ORAL_TABLET | ORAL | 0 refills | Status: DC

## 2017-05-22 ENCOUNTER — Other Ambulatory Visit: Payer: Self-pay | Admitting: Family Medicine

## 2017-05-24 ENCOUNTER — Other Ambulatory Visit: Payer: Self-pay | Admitting: Family Medicine

## 2017-05-25 ENCOUNTER — Encounter: Payer: Self-pay | Admitting: Family Medicine

## 2017-05-26 ENCOUNTER — Other Ambulatory Visit: Payer: Self-pay | Admitting: Emergency Medicine

## 2017-05-26 MED ORDER — ALPRAZOLAM 1 MG PO TABS: 1.0000 mg | ORAL_TABLET | Freq: Every evening | ORAL | 1 refills | Status: DC | PRN

## 2017-05-26 NOTE — Telephone Encounter (Signed)
Requesting: ALPRAZolam (XANAX) 1 MG tablet Contract UDS Last OV: 04/07/17 Last Refill: 03/24/17  Please Advise

## 2017-06-07 LAB — COLOGUARD: COLOGUARD: NEGATIVE

## 2017-06-10 ENCOUNTER — Other Ambulatory Visit: Payer: Self-pay | Admitting: Family Medicine

## 2017-06-10 ENCOUNTER — Encounter: Payer: Self-pay | Admitting: Family Medicine

## 2017-06-10 NOTE — Progress Notes (Signed)
Cologuard result received. Negative. Result abstracted, mailed to pt and forwarded to PCP for review.

## 2017-06-12 ENCOUNTER — Encounter: Payer: Self-pay | Admitting: Family Medicine

## 2017-06-17 DIAGNOSIS — N879 Dysplasia of cervix uteri, unspecified: Secondary | ICD-10-CM | POA: Diagnosis not present

## 2017-06-17 DIAGNOSIS — N870 Mild cervical dysplasia: Secondary | ICD-10-CM | POA: Diagnosis not present

## 2017-06-18 ENCOUNTER — Other Ambulatory Visit: Payer: Self-pay | Admitting: Family Medicine

## 2017-06-18 ENCOUNTER — Encounter: Payer: Self-pay | Admitting: Family Medicine

## 2017-06-18 NOTE — Telephone Encounter (Signed)
Copied from [place] [phone]. Topic: Quick Communication - See Telephone Encounter >> Jun 18, 2017  1:11 PM Boyd Kerbs wrote: CRM for notification. See Telephone encounter for:   Citalopram 20 mg  Metformin 500 mg  Pramipexole 0.25 mg   She is wanting to switch all prescriptions to [facility] for all medications   [place], [address] [place] [address] Phone: [phone] Fax: [phone]    06/18/17.

## 2017-06-18 NOTE — Telephone Encounter (Signed)
Tried to contact pt to clarify if the pt will need refills sent to both [facility] and [facility]. She listed the medications that [facility] will fill but I need to know if she will need those medications filled now with [facility] and if we should [facility] the remaining [facility]. It is ok for pt to leave a message with this information.

## 2017-06-19 ENCOUNTER — Encounter: Payer: Self-pay | Admitting: Family Medicine

## 2017-06-19 ENCOUNTER — Other Ambulatory Visit: Payer: Self-pay | Admitting: Emergency Medicine

## 2017-06-19 DIAGNOSIS — G2581 Restless legs syndrome: Secondary | ICD-10-CM

## 2017-06-19 MED ORDER — OMEPRAZOLE 40 MG PO CPDR: 40.0000 mg | DELAYED_RELEASE_CAPSULE | Freq: Every day | ORAL | 0 refills | Status: DC

## 2017-06-19 MED ORDER — METFORMIN HCL 500 MG PO TABS: ORAL_TABLET | ORAL | 1 refills | Status: DC

## 2017-06-19 MED ORDER — PRAMIPEXOLE DIHYDROCHLORIDE 0.25 MG PO TABS: ORAL_TABLET | ORAL | 0 refills | Status: DC

## 2017-06-19 MED ORDER — CITALOPRAM HYDROBROMIDE 20 MG PO TABS: ORAL_TABLET | ORAL | 1 refills | Status: DC

## 2017-06-19 NOTE — Telephone Encounter (Signed)
Patient called to clarify which pharmacy to send her medication to and if she has enough medications to last until her mail order comes in, she verifies she wants them to go to [facility] and she has enough. She says she will use [facility] for when the medication are not filled by [facility]. Medications are: Citalopram 20 mg  Metformin 500 mg  Pramipexole 0.25 mg  Omeprazole 40mg 

## 2017-06-20 ENCOUNTER — Other Ambulatory Visit: Payer: Self-pay

## 2017-06-20 ENCOUNTER — Other Ambulatory Visit: Payer: Self-pay | Admitting: Family Medicine

## 2017-06-20 DIAGNOSIS — G2581 Restless legs syndrome: Secondary | ICD-10-CM

## 2017-06-20 MED ORDER — METFORMIN HCL 500 MG PO TABS: ORAL_TABLET | ORAL | 1 refills | Status: DC

## 2017-06-20 MED ORDER — OMEPRAZOLE 40 MG PO CPDR: 40.0000 mg | DELAYED_RELEASE_CAPSULE | Freq: Every day | ORAL | 2 refills | Status: DC

## 2017-06-20 MED ORDER — PRAMIPEXOLE DIHYDROCHLORIDE 0.25 MG PO TABS: ORAL_TABLET | ORAL | 2 refills | Status: DC

## 2017-06-20 MED ORDER — CITALOPRAM HYDROBROMIDE 20 MG PO TABS: ORAL_TABLET | ORAL | 2 refills | Status: DC

## 2017-06-20 NOTE — Telephone Encounter (Signed)
Faxed in as requested to [facility]/thx dmf

## 2017-06-24 ENCOUNTER — Ambulatory Visit: Payer: 59 | Admitting: Neurology

## 2017-06-25 ENCOUNTER — Encounter: Payer: Self-pay | Admitting: Family Medicine

## 2017-06-25 ENCOUNTER — Telehealth: Payer: Self-pay | Admitting: *Deleted

## 2017-06-25 NOTE — Telephone Encounter (Signed)
Received results from Cologuard; forwarded to provider/SLS 02/13

## 2017-06-26 ENCOUNTER — Encounter: Payer: Self-pay | Admitting: Family Medicine

## 2017-06-26 NOTE — Telephone Encounter (Signed)
Copied from [place] [phone]. Topic: Inquiry >> Jun 26, 2017  5:35 PM Neva Seat wrote: [facility] [phone] Ref # [id number]   Citalopram 20 mg Omeprazloe 40 mg  Feb 8th may interact.  Please call pharmacy back to verify.

## 2017-06-27 DIAGNOSIS — M25562 Pain in left knee: Secondary | ICD-10-CM | POA: Diagnosis not present

## 2017-06-27 DIAGNOSIS — S43412D Sprain of left coracohumeral (ligament), subsequent encounter: Secondary | ICD-10-CM | POA: Diagnosis not present

## 2017-06-27 DIAGNOSIS — S83412D Sprain of medial collateral ligament of left knee, subsequent encounter: Secondary | ICD-10-CM | POA: Diagnosis not present

## 2017-06-27 DIAGNOSIS — Z96653 Presence of artificial knee joint, bilateral: Secondary | ICD-10-CM | POA: Diagnosis not present

## 2017-06-27 MED ORDER — METFORMIN HCL 500 MG PO TABS: ORAL_TABLET | ORAL | 3 refills | Status: DC

## 2017-06-27 MED ORDER — ALPRAZOLAM 1 MG PO TABS: 1.0000 mg | ORAL_TABLET | Freq: Every evening | ORAL | 1 refills | Status: DC | PRN

## 2017-06-27 NOTE — Addendum Note (Signed)
Addended by: Lamar Blinks C on: 06/27/2017 12:17 PM   Modules accepted: Orders

## 2017-07-03 ENCOUNTER — Encounter: Payer: Self-pay | Admitting: Family Medicine

## 2017-07-03 DIAGNOSIS — G2581 Restless legs syndrome: Secondary | ICD-10-CM

## 2017-07-05 ENCOUNTER — Other Ambulatory Visit: Payer: Self-pay | Admitting: Podiatry

## 2017-07-08 ENCOUNTER — Ambulatory Visit: Payer: 59 | Admitting: Neurology

## 2017-07-08 DIAGNOSIS — N870 Mild cervical dysplasia: Secondary | ICD-10-CM | POA: Diagnosis not present

## 2017-07-08 DIAGNOSIS — N879 Dysplasia of cervix uteri, unspecified: Secondary | ICD-10-CM | POA: Diagnosis not present

## 2017-07-09 ENCOUNTER — Other Ambulatory Visit: Payer: Self-pay | Admitting: *Deleted

## 2017-07-09 MED ORDER — MELOXICAM 15 MG PO TABS: 15.0000 mg | ORAL_TABLET | Freq: Every day | ORAL | 0 refills | Status: DC

## 2017-07-09 NOTE — Progress Notes (Addendum)
[facility] sent a refill request for Meloxicam.  Prescription was denied.  Patient will need to schedule an appointment before any more prescriptions can be given.

## 2017-07-09 NOTE — Addendum Note (Signed)
Addended by: Lolita Rieger on: 07/09/2017 05:27 PM   Modules accepted: Orders

## 2017-07-10 MED ORDER — CITALOPRAM HYDROBROMIDE 20 MG PO TABS: ORAL_TABLET | ORAL | 3 refills | Status: DC

## 2017-07-10 MED ORDER — CITALOPRAM HYDROBROMIDE 20 MG PO TABS: ORAL_TABLET | ORAL | 1 refills | Status: DC

## 2017-07-10 MED ORDER — GABAPENTIN 300 MG PO CAPS: ORAL_CAPSULE | ORAL | 3 refills | Status: DC

## 2017-07-10 NOTE — Addendum Note (Signed)
Addended by: Lamar Blinks C on: 07/10/2017 02:47 PM   Modules accepted: Orders

## 2017-07-10 NOTE — Addendum Note (Signed)
Addended by: Lamar Blinks C on: 07/10/2017 03:12 PM   Modules accepted: Orders

## 2017-07-11 ENCOUNTER — Other Ambulatory Visit: Payer: Self-pay | Admitting: Family Medicine

## 2017-07-11 ENCOUNTER — Other Ambulatory Visit: Payer: Self-pay | Admitting: Family

## 2017-07-11 MED ORDER — OMEPRAZOLE 40 MG PO CPDR: 40.0000 mg | DELAYED_RELEASE_CAPSULE | Freq: Every day | ORAL | 0 refills | Status: DC

## 2017-08-01 ENCOUNTER — Other Ambulatory Visit: Payer: Self-pay | Admitting: Emergency Medicine

## 2017-08-01 DIAGNOSIS — E119 Type 2 diabetes mellitus without complications: Secondary | ICD-10-CM

## 2017-08-01 MED ORDER — DULAGLUTIDE 0.75 MG/0.5ML ~~LOC~~ SOAJ: 0.7500 mg | SUBCUTANEOUS | 11 refills | Status: DC

## 2017-08-01 NOTE — Telephone Encounter (Signed)
Received refill request for Dulaglutide (Trulicity) 0.03 MG/ 0.5 ML. Last office visit 04/07/17 and last refill 08/27/16. Refill sent as requested to [facility].

## 2017-08-06 ENCOUNTER — Other Ambulatory Visit: Payer: Self-pay | Admitting: Emergency Medicine

## 2017-08-06 MED ORDER — OMEPRAZOLE 40 MG PO CPDR: 40.0000 mg | DELAYED_RELEASE_CAPSULE | Freq: Every day | ORAL | 0 refills | Status: DC

## 2017-08-10 ENCOUNTER — Other Ambulatory Visit: Payer: Self-pay | Admitting: Family

## 2017-08-11 NOTE — Telephone Encounter (Signed)
Refill request of omeprazole from [facility] denied as refill was sent to [facility] on 08/06/17. Message sent to pt to verify if she needs short term Rx at [facility] or if this was duplicate request.

## 2017-08-26 ENCOUNTER — Telehealth: Payer: Self-pay | Admitting: Family Medicine

## 2017-08-26 NOTE — Telephone Encounter (Signed)
Spoke with Jodi Williams regarding the Emmi call she received. Informed patient that she is eligible for Welcome to Medicare Visit at this time because her Medicare became active in Feb 2019. Patient stated that she will give office a call back later in the year to schedule her Welcome to Medicare Visit. SF

## 2017-09-10 ENCOUNTER — Ambulatory Visit: Payer: Medicare Other | Admitting: Podiatry

## 2017-09-10 ENCOUNTER — Other Ambulatory Visit: Payer: Self-pay

## 2017-09-10 DIAGNOSIS — M722 Plantar fascial fibromatosis: Secondary | ICD-10-CM | POA: Diagnosis not present

## 2017-09-10 MED ORDER — MELOXICAM 15 MG PO TABS: 15.0000 mg | ORAL_TABLET | Freq: Every day | ORAL | 1 refills | Status: DC

## 2017-09-10 MED ORDER — MELOXICAM 15 MG PO TABS: 15.0000 mg | ORAL_TABLET | Freq: Every day | ORAL | 1 refills | Status: AC

## 2017-09-10 MED ORDER — METHYLPREDNISOLONE 4 MG PO TBPK: ORAL_TABLET | ORAL | 0 refills | Status: DC

## 2017-09-14 NOTE — Progress Notes (Signed)
   Subjective: 67 year old female presenting today for follow up evaluation of plantar fasciitis of the left foot. She states the left heel is very painful. She states she has been taking Meloxicam for pain is in need of a refill. She reports some relief after receiving the injections. Patient is here for further evaluation and treatment.   Past Medical History:  Diagnosis Date  . Allergy   . Anxiety   . Arthritis   . Asthma   . Cataract   . COPD (chronic obstructive pulmonary disease) ([place])   . Depression   . Diabetes mellitus ([place])   . Hyperlipidemia   . Hypertension   . Ulcer      Objective: Physical Exam General: The patient is alert and oriented x3 in no acute distress.  Dermatology: Skin is warm, dry and supple bilateral lower extremities. Negative for open lesions or macerations bilateral.   Vascular: Dorsalis Pedis and Posterior Tibial pulses palpable bilateral.  Capillary fill time is immediate to all digits.  Neurological: Epicritic and protective threshold intact bilateral.   Musculoskeletal: Tenderness to palpation to the plantar aspect of the left heel along the plantar fascia. All other joints range of motion within normal limits bilateral. Strength 5/5 in all groups bilateral.     Assessment: 1. Plantar fasciitis left foot  Plan of Care:  1. Patient evaluated.  2. Injection of 0.5cc Celestone soluspan injected into the left plantar fascia.  3. Rx for Medrol Dose Pak placed 4. Rx for Meloxicam ordered for patient. 5. Continue wearing Plantar fascial band.  6. Return to clinic as needed.   [place]     Edrick Kins, DPM [facility]  Dr. Edrick Kins, DPM    [address], [place] [address]                Office [phone]  Fax [phone]

## 2017-09-17 ENCOUNTER — Telehealth: Payer: Self-pay | Admitting: Podiatry

## 2017-09-17 NOTE — Telephone Encounter (Signed)
Heel pain is worse and she was wanting to know if she can have a MRI done of her heel to see if she has plantar fas because the shot didn't help. If you can call her back at [phone]

## 2017-09-17 NOTE — Telephone Encounter (Signed)
I spoke with pt and told her she should make an appt if she has worsened over the last 7 days. [place] had a deep Ultrasound and it really helped. I told pt we have the shockwave and it stimulated healing blood flow to the plantar fascia, and the procedure would be scheduled with the nurse, and insurance did not cover. I told pt that until she was seen she should perform ice therapy 3-4 times daily 15-20 minutes/session protecting the skin with a towel and perform stretches prior to getting up from a resting position and through the day.

## 2017-09-24 ENCOUNTER — Other Ambulatory Visit: Payer: Self-pay | Admitting: Family Medicine

## 2017-10-07 ENCOUNTER — Telehealth: Payer: Self-pay | Admitting: Family Medicine

## 2017-10-07 NOTE — Telephone Encounter (Signed)
Copied from [place]. Topic: Quick Communication - See Telephone Encounter >> Oct 07, 2017 12:41 PM Vernona Rieger wrote: CRM for notification. See Telephone encounter for: 10/07/17.  [facility] called and stated they shipped out her ALPRAZolam Duanne Moron) 1 MG tablet to her home address but she is at her summer address which is in [place]. He wants to know can he ship out another prescription to the summer address. Please advise. [phone] reference number [id number]

## 2017-10-08 NOTE — Telephone Encounter (Signed)
Author phoned pt. To retrieve summer address to re-send alprazolam per pt. Request. Author then phoned [facility], who stated that pt. Would need to call customer service in order for the alprazolam to be resent out to [address] location. Author phoned pt. Back to notify her to call [phone]. Pt. Appreciative, will call back to the office is she has any other issues.

## 2017-10-13 ENCOUNTER — Encounter: Payer: Self-pay | Admitting: Family Medicine

## 2017-10-14 ENCOUNTER — Telehealth: Payer: Self-pay

## 2017-10-14 NOTE — Telephone Encounter (Signed)
[facility] calling to see if they can get a new script to send to this pt.

## 2017-10-14 NOTE — Telephone Encounter (Signed)
Copied from [place]. Topic: Quick Communication - See Telephone Encounter >> Oct 07, 2017 12:41 PM Vernona Rieger wrote: CRM for notification. See Telephone encounter for: 10/07/17.  [facility] called and stated they shipped out her ALPRAZolam Duanne Moron) 1 MG tablet to her home address but she is at her summer address which is in [place]. He wants to know can he ship out another prescription to the summer address. Please advise. [phone] reference number [id number] >> Oct 13, 2017  5:13 PM Percell Belt A wrote: [facility] in and stated that they shipped this med to pt Winter address instead of her summer address.  Pt live 2 different places.  They are needing another script sent over to mail it out to right address   Best number [phone] Ref [id number]

## 2017-10-14 NOTE — Telephone Encounter (Signed)
Please see mychart message as well

## 2017-10-26 ENCOUNTER — Encounter: Payer: Self-pay | Admitting: Family Medicine

## 2017-11-03 ENCOUNTER — Encounter (INDEPENDENT_AMBULATORY_CARE_PROVIDER_SITE_OTHER): Payer: Self-pay | Admitting: Orthopaedic Surgery

## 2017-11-03 ENCOUNTER — Ambulatory Visit (INDEPENDENT_AMBULATORY_CARE_PROVIDER_SITE_OTHER): Payer: Medicare Other | Admitting: Orthopaedic Surgery

## 2017-11-03 ENCOUNTER — Ambulatory Visit (INDEPENDENT_AMBULATORY_CARE_PROVIDER_SITE_OTHER): Payer: Medicare Other

## 2017-11-03 DIAGNOSIS — M25562 Pain in left knee: Secondary | ICD-10-CM | POA: Diagnosis not present

## 2017-11-03 DIAGNOSIS — Z96659 Presence of unspecified artificial knee joint: Secondary | ICD-10-CM | POA: Diagnosis not present

## 2017-11-03 DIAGNOSIS — G8929 Other chronic pain: Secondary | ICD-10-CM

## 2017-11-03 MED ORDER — TRAMADOL HCL 50 MG PO TABS: 50.0000 mg | ORAL_TABLET | Freq: Four times a day (QID) | ORAL | 0 refills | Status: DC | PRN

## 2017-11-03 MED ORDER — DICLOFENAC SODIUM 1 % TD GEL: 2.0000 g | Freq: Four times a day (QID) | TRANSDERMAL | 3 refills | Status: DC

## 2017-11-03 NOTE — Progress Notes (Signed)
Office Visit Note   Patient: Jodi Williams           Date of Birth: [birth year]           MRN: [id number] Visit Date: 11/03/2017              Requested by: Darreld Mclean, MD [address] [place], [place] [address] PCP: Darreld Mclean, MD   Assessment & Plan: Visit Diagnoses:  1. Chronic pain of left knee   2. History of partial knee replacement     Plan: The patient fully understands this is a quite a tough situation given the fact that her right knee is done so well in the left knee shows only pain on clinical exam mobility but otherwise no effusion and no radiographic evidence showing any complicating features.  At this point she is tried and failed all forms of conservative treatment and the only other thing to consider is a knee revision from partial medial total knee.  She understands that even patients that have revision surgery can still experience pain.  She also understands that her vision can be a little bit more difficult when the implants are well seated or down due to the fact that change in the joint line will certainly require even revision components.  I talked her in detail about the her implants showing her knee models.  She is not planning to do anything now and was going to wait till at least the fall of this year.  I will send in some Voltaren gel for her to try in this area of her knee as well as some tramadol for pain.  All questions concerns were answered and addressed.  I am happy to see her back in the fall to readdress this if needed.  Follow-Up Instructions: Return in about 3 months (around 02/03/2018).   Orders:  Orders Placed This Encounter  Procedures  . XR Knee 1-2 Views Left   Meds ordered this encounter  Medications  . diclofenac sodium (VOLTAREN) 1 % GEL    Sig: Apply 2 g topically 4 (four) times daily.    Dispense:  100 g    Refill:  3  . traMADol (ULTRAM) 50 MG tablet    Sig: Take 1-2 tablets (50-100 mg total) by mouth every  6 (six) hours as needed.    Dispense:  50 tablet    Refill:  0      Procedures: No procedures performed   Clinical Data: No additional findings.   Subjective: Chief Complaint  Patient presents with  . Left Knee - Pain  The patient comes in mainly today for second opinion as a relates to her left knee.  She has a history of bilateral partial knee arthroplasties that were done elsewhere in town.  The right knee is done well for her but the left knee continues to hurt on the medial joint line.  She has had numerous follow-up appointments with her orthopedic surgeon who replaced that knee.  There is actually an MRI of the left knee and a bone scan of the left knee on the canopy system for me to review.  It is only medial joint space where she hurts and she points to the medial tibial plateau and the medial femoral condyle as a source of her pain.  She says it aches daily and hurts more with activities.  She has no issues as relates to her right knee at all.  She  has had intra-articular steroid injection in the left knee that did ease pain off but just only minimally.  His left knee partial replacement was done 3 years ago.  She denies any metal allergies.  She denies any trauma to that knee.    HPI  Review of Systems She currently denies any headache, chest pain, shortness of breath, fever, chills, nausea, vomiting.  Objective: Vital Signs: There were no vitals taken for this visit.  Physical Exam She is alert and oriented x3 and in no acute distress Ortho Exam Examination of both her knees show well-healed surgical incisions from midline incisions at both knees.  Neither knee has an effusion at all.  Both knees have full range of motion and feels ligaments is stable.  Her left knee does hurt along the medial joint line at the tibial plateau and the medial femoral condyle.    Specialty Comments:  No specialty comments available.  Imaging: Xr Knee 1-2 Views Left  Result Date:  11/03/2017 2 views of the left knee show a partial medial compartment knee replacement with no complicating features.  There is small periarticular osteophytes and lateral compartment with a wide open lateral compartment joint space.  There is patella-femoral arthritic changes.  Independent review of the bone scan and the MRI showed no complicating features of her left partial knee arthroplasty.  There is no evidence of loosening.  PMFS History: Patient Active Problem List   Diagnosis Date Noted  . History of partial knee replacement 11/03/2017  . Chronic pain of left knee 11/03/2017  . Chronic migraine 03/31/2017  . Preop cardiovascular exam 07/12/2015  . Dyspnea 07/12/2015  . Chest pain 07/12/2015  . Restless legs 05/24/2015  . Diabetes ([place]) 08/24/2013  . Other and unspecified hyperlipidemia 09/07/2012  . HTN (hypertension) 04/12/2012  . OA (osteoarthritis) of knee 04/12/2012  . 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 Depression with anxiety 04/12/2012  . H/O gastric ulcer 04/12/2012  . ALLERGIC RHINITIS WITH CONJUNCTIVITIS 09/08/2007  . ASTHMA 07/20/2007   Past Medical History:  Diagnosis Date  . Allergy   . Anxiety   . Arthritis   . Asthma   . Cataract   . COPD (chronic obstructive pulmonary disease) ([place])   . Depression   . Diabetes mellitus ([place])   . Hyperlipidemia   . Hypertension   . Ulcer     Family History  Problem Relation Age of Onset  . Hyperlipidemia Mother   . Hypertension Mother   . Heart disease Mother   . Dementia Mother   . Arthritis Mother   . Neuropathy Father   . Diabetes Father   . Kidney disease Father     Past Surgical History:  Procedure Laterality Date  . BACK SURGERY    . BREAST SURGERY    . EYE SURGERY     cataract  . HAND SURGERY    . KNEE SURGERY     Social History   Occupational History  . Occupation: Electrical engineer  Tobacco Use  . Smoking status: Never Smoker  . Smokeless tobacco: Never Used  Substance and Sexual Activity    . Alcohol use: Yes    Alcohol/week: 0.0 oz    Comment: Rare  . Drug use: No  . Sexual activity: Yes    Birth control/protection: None

## 2017-11-19 ENCOUNTER — Telehealth: Payer: Self-pay | Admitting: *Deleted

## 2017-11-19 MED ORDER — MELOXICAM 15 MG PO TABS: 15.0000 mg | ORAL_TABLET | Freq: Every day | ORAL | 2 refills | Status: DC

## 2017-11-19 NOTE — Telephone Encounter (Signed)
Refill request for Meloxicam. Refilled as previously per Dr. Amalia Hailey.

## 2017-11-26 ENCOUNTER — Other Ambulatory Visit: Payer: Self-pay | Admitting: Family Medicine

## 2017-11-27 ENCOUNTER — Encounter: Payer: Self-pay | Admitting: Neurology

## 2017-11-27 ENCOUNTER — Other Ambulatory Visit: Payer: Self-pay | Admitting: *Deleted

## 2017-11-27 MED ORDER — NORTRIPTYLINE HCL 10 MG PO CAPS: 20.0000 mg | ORAL_CAPSULE | Freq: Every day | ORAL | 0 refills | Status: DC

## 2017-12-01 ENCOUNTER — Ambulatory Visit: Payer: Self-pay | Admitting: Family Medicine

## 2017-12-02 NOTE — Progress Notes (Signed)
[facility] [address], [place], [place] [address] [phone]  Date:  12/03/2017   Name:  Jodi Williams   DOB:  December 25, 1950   MRN:  [id number]  PCP:  Darreld Mclean, MD    Chief Complaint: Diabetes (follow up, wuit taking trulity due to insurance donut hole)   History of Present Illness:  Jodi Williams is a 67 y.o. very pleasant female patient who presents with the following:  Here today to follow-up on her DM History of DM, HTN, OA, hyperlipidemia, asthma  Metformin 650 BID, trulicity for her DM   Lab Results  Component Value Date   HGBA1C 7.2 (H) 04/07/2017   I last saw her in November Due for prevnar 13- do for her today  Mammo:  She has done per GYN, last done with her pap in October of 2018 Dexa:  Not done yet Td also due today- pt would like to do today   She came off trulicity as she could not afford to use it.  She was in the "donut hole" but thinks that she is out now and plans to go back on this medication She stopped using this about 2 months ago She is using metformin 500 BID currently  Her glucose is running quite high- 247 this am fasting   She was also on mirapex for RLS- she tried to stop it but had to go back on this due to her leg sx.  She wonders if this can cause weight gain but does not appear to be a common SE  She will be having a left total knee per Dr. Rush Farmer. They are following up in September to make plans for this operation.  Right now her leg pain is such that she has a hard time walking or getting really any exercise    Patient Active Problem List   Diagnosis Date Noted  . History of partial knee replacement 11/03/2017  . Chronic pain of left knee 11/03/2017  . Chronic migraine 03/31/2017  . Preop cardiovascular exam 07/12/2015  . Dyspnea 07/12/2015  . Chest pain 07/12/2015  . Restless legs 05/24/2015  . Diabetes ([place]) 08/24/2013  . Other and unspecified hyperlipidemia  09/07/2012  . HTN (hypertension) 04/12/2012  . OA (osteoarthritis) of knee 04/12/2012  . 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 Depression with anxiety 04/12/2012  . H/O gastric ulcer 04/12/2012  . ALLERGIC RHINITIS WITH CONJUNCTIVITIS 09/08/2007  . ASTHMA 07/20/2007    Past Medical History:  Diagnosis Date  . Allergy   . Anxiety   . Arthritis   . Asthma   . Cataract   . COPD (chronic obstructive pulmonary disease) ([place])   . Depression   . Diabetes mellitus ([place])   . Hyperlipidemia   . Hypertension   . Ulcer     Past Surgical History:  Procedure Laterality Date  . BACK SURGERY    . BREAST SURGERY    . EYE SURGERY     cataract  . HAND SURGERY    . KNEE SURGERY      Social History   Tobacco Use  . Smoking status: Never Smoker  . Smokeless tobacco: Never Used  Substance Use Topics  . Alcohol use: Yes    Alcohol/week: 0.0 oz    Comment: Rare  . Drug use: No    Family History  Problem Relation Age of Onset  . Hyperlipidemia Mother   . Hypertension Mother   .  Heart disease Mother   . Dementia Mother   . Arthritis Mother   . Neuropathy Father   . Diabetes Father   . Kidney disease Father     Allergies  Allergen Reactions  . Omnicef [Cefdinir] Rash    Medication list has been reviewed and updated.  Current Outpatient Medications on File Prior to Visit  Medication Sig Dispense Refill  . acetaminophen (TYLENOL 8 HOUR ARTHRITIS PAIN) 650 MG CR tablet Take 650 mg by mouth every 8 (eight) hours as needed for pain.    Marland Kitchen albuterol (PROVENTIL HFA;VENTOLIN HFA) 108 (90 BASE) MCG/ACT inhaler Inhale 2 puffs into the lungs every 4 (four) hours as needed for wheezing or shortness of breath (cough, shortness of breath or wheezing.). 1 Inhaler 1  . ALPRAZolam (XANAX) 1 MG tablet Take 1 tablet (1 mg total) by mouth at bedtime as needed. for sleep 90 tablet 1  . amoxicillin (AMOXIL) 500 MG capsule Take 4 capsules prior to dental appointment for cleanings.  2  . citalopram  (CELEXA) 20 MG tablet TAKE 1 TABLET BY MOUTH DAILY 30 tablet 1  . diclofenac sodium (VOLTAREN) 1 % GEL Apply 2 g topically 4 (four) times daily. 100 g 3  . gabapentin (NEURONTIN) 300 MG capsule Start with 1 cap at bedtime for restless legs.  May titrate up to three times a day gradually if needed 270 capsule 3  . lisinopril-hydrochlorothiazide (PRINZIDE,ZESTORETIC) 10-12.5 MG tablet TAKE 1 TABLET BY MOUTH DAILY 90 tablet 1  . meloxicam (MOBIC) 15 MG tablet Take 1 tablet (15 mg total) by mouth daily. 60 tablet 2  . metFORMIN (GLUCOPHAGE) 500 MG tablet TAKE 1 TABLET BY MOUTH TWICE DAILY WITH MEALS 180 tablet 3  . methocarbamol (ROBAXIN) 500 MG tablet Take 1 tablet (500 mg total) by mouth daily. Use if needed for leg pain 30 tablet 1  . nortriptyline (PAMELOR) 10 MG capsule Take 2 capsules (20 mg total) by mouth at bedtime. 180 capsule 0  . omeprazole (PRILOSEC) 40 MG capsule TAKE 1 CAPSULE BY MOUTH  DAILY 90 capsule 0  . pramipexole (MIRAPEX) 0.25 MG tablet TAKE 1 TABLET BY MOUTH EVERY EVENING AS NEEDED FOR LEG PAIN. MAY TAKE 2ND TABLET IF NEEDED 180 tablet 2  . pravastatin (PRAVACHOL) 20 MG tablet TAKE 1 TABLET BY MOUTH DAILY. MAY TAKE EVERY 2ND OR 3RD DAY IF ACHES 90 tablet 3  . rizatriptan (MAXALT-MLT) 5 MG disintegrating tablet Take 1 tablet (5 mg total) as needed by mouth. May repeat in 2 hours if needed 15 tablet 12  . traMADol (ULTRAM) 50 MG tablet Take 1-2 tablets (50-100 mg total) by mouth every 6 (six) hours as needed. 50 tablet 0  . XIIDRA 5 % SOLN INT 1 GTT INTO OU BID  3  . Dulaglutide (TRULICITY) 1.51 VO/1.6WV SOPN Inject 0.75 mg into the skin once a week. (Patient not taking: Reported on 12/03/2017) 4 pen 11   No current facility-administered medications on file prior to visit.     Review of Systems:  As per HPI- otherwise negative. No fever or chills No CP or SOB   Physical Examination: Vitals:   12/03/17 1113  BP: 112/80  Pulse: 87  Resp: 16  SpO2: 97%   Vitals:    12/03/17 1113  Weight: 180 lb (81.6 kg)  Height: 5\' 2"  (1.575 m)   Body mass index is 32.92 kg/m. Ideal Body Weight: Weight in (lb) to have BMI = 25: 136.4  GEN: WDWN, NAD, Non-toxic, A &  O x 3, obese, [place], looks well  HEENT: Atraumatic, Normocephalic. Neck supple. No masses, No LAD. Ears and Nose: No external deformity. CV: RRR, No M/G/R. No JVD. No thrill. No extra heart sounds. PULM: CTA B, no wheezes, crackles, rhonchi. No retractions. No resp. distress. No accessory muscle use. ABD: S, NT, ND EXTR: No c/c/e NEURO Normal gait.  PSYCH: Normally interactive. Conversant. Not depressed or anxious appearing.  Calm demeanor.    Assessment and Plan: Controlled type 2 diabetes mellitus without complication, without long-term current use of insulin ([place]) - Plan: Basic metabolic panel, Hemoglobin A1c  Dyslipidemia  Essential hypertension, benign  Immunization due - Plan: Pneumococcal conjugate vaccine 13-valent IM, Td vaccine greater than or equal to 7yo preservative free IM  Estrogen deficiency - Plan: DG Bone Density  Screening for osteoporosis - Plan: DG Bone Density  History of DM- stopped her trulicty due to cost.  Gave her 4 sample pens today.  She is now out of the donut hole and will go back on this med BP is under good control on current regimen of prinzide  prevnar 13 today Ordered dexa scan for her  Signed Lamar Blinks, MD  Received labs, message to pt:  Results for orders placed or performed in visit on 16/55/37  Basic metabolic panel  Result Value Ref Range   Sodium 136 135 - 145 mEq/L   Potassium 4.6 3.5 - 5.1 mEq/L   Chloride 97 96 - 112 mEq/L   CO2 29 19 - 32 mEq/L   Glucose, Bld 210 (H) 70 - 99 mg/dL   BUN 19 6 - 23 mg/dL   Creatinine, Ser 0.84 0.40 - 1.20 mg/dL   Calcium 9.7 8.4 - 10.5 mg/dL   GFR 72.22 >60.00 mL/min  Hemoglobin A1c  Result Value Ref Range   Hgb A1c MFr Bld 9.9 (H) 4.6 - 6.5 %   Your A1c has gone  up quite a bit. In addition to getting back on your trulicity I think we should increase your metformin dose I will send in a new rx for you- increase to 1000 mg twice a day or 2 of the 500 mg metformin tabs twice a day.  Increase gradually and wait until you have been back on your trulicity for about 2 weeks before going up on your metformin  Please see me in 3 months to repeat your A1c

## 2017-12-03 ENCOUNTER — Telehealth: Payer: Self-pay

## 2017-12-03 ENCOUNTER — Encounter: Payer: Self-pay | Admitting: Family Medicine

## 2017-12-03 ENCOUNTER — Ambulatory Visit (INDEPENDENT_AMBULATORY_CARE_PROVIDER_SITE_OTHER): Payer: Medicare Other | Admitting: Family Medicine

## 2017-12-03 VITALS — BP 112/80 | HR 87 | Resp 16 | Ht 62.0 in | Wt 180.0 lb

## 2017-12-03 DIAGNOSIS — E2839 Other primary ovarian failure: Secondary | ICD-10-CM

## 2017-12-03 DIAGNOSIS — E119 Type 2 diabetes mellitus without complications: Secondary | ICD-10-CM | POA: Diagnosis not present

## 2017-12-03 DIAGNOSIS — I10 Essential (primary) hypertension: Secondary | ICD-10-CM

## 2017-12-03 DIAGNOSIS — E785 Hyperlipidemia, unspecified: Secondary | ICD-10-CM | POA: Diagnosis not present

## 2017-12-03 DIAGNOSIS — Z23 Encounter for immunization: Secondary | ICD-10-CM

## 2017-12-03 DIAGNOSIS — Z13820 Encounter for screening for osteoporosis: Secondary | ICD-10-CM

## 2017-12-03 LAB — BASIC METABOLIC PANEL
BUN: 19 mg/dL (ref 6–23)
CO2: 29 mEq/L (ref 19–32)
Calcium: 9.7 mg/dL (ref 8.4–10.5)
Chloride: 97 mEq/L (ref 96–112)
Creatinine, Ser: 0.84 mg/dL (ref 0.40–1.20)
GFR: 72.22 mL/min (ref >60.00)
Glucose, Bld: 210 mg/dL — ABNORMAL HIGH (ref 70–99)
POTASSIUM: 4.6 meq/L (ref 3.5–5.1)
Sodium: 136 mEq/L (ref 135–145)

## 2017-12-03 LAB — HEMOGLOBIN A1C: Hgb A1c MFr Bld: 9.9 % — ABNORMAL HIGH (ref 4.6–6.5)

## 2017-12-03 MED ORDER — MELOXICAM 15 MG PO TABS: 15.0000 mg | ORAL_TABLET | Freq: Every day | ORAL | 1 refills | Status: DC

## 2017-12-03 MED ORDER — METFORMIN HCL 500 MG PO TABS: ORAL_TABLET | ORAL | 3 refills | Status: DC

## 2017-12-03 NOTE — Patient Instructions (Addendum)
Please start back on your trulicity- I gave you some samples today to get your started I will be in touch with your labs We ordered a bone density for you today- this can be scheduled at your convenience  You got your tetanus and pneumonia booster shots today

## 2017-12-03 NOTE — Telephone Encounter (Signed)
Copied from [place] [id number]. Topic: General - Other >> Dec 02, 2017  9:38 AM Oneta Rack wrote: Caller name: Richard Relation to pt: [facility] Call back number: [phone] Pharmacy: [facility], [place] [phone] ([phone] (Fax)   Reason for call:  Requesting 3 month supply meloxicam (MOBIC) 7.5 MG tablet, informed please allow 48 to 72 hour turn around, please advise

## 2017-12-04 ENCOUNTER — Encounter: Payer: Self-pay | Admitting: Family Medicine

## 2017-12-10 ENCOUNTER — Encounter: Payer: Self-pay | Admitting: Family Medicine

## 2017-12-16 ENCOUNTER — Other Ambulatory Visit: Payer: Self-pay | Admitting: Family Medicine

## 2017-12-17 NOTE — Telephone Encounter (Signed)
Requesting: [facility] Contract:none, needs csc DXF:PKGY, needs uds Last Visit:12/03/17 Next Visit:none with pcp Last Refill:06/27/17 #90 1 refill  Please Advise

## 2017-12-30 ENCOUNTER — Telehealth: Payer: Self-pay | Admitting: Family Medicine

## 2017-12-30 NOTE — Telephone Encounter (Signed)
Called [facility], this is ok

## 2017-12-30 NOTE — Telephone Encounter (Signed)
Copied from [place] [phone]. Topic: General - Other >> Dec 30, 2017 10:59 AM Lennox Solders wrote: Reason for EAV:[facility] pharm with [facility] needs permission to resend pt alprazolam. The pt regular mailman was on vacation and the substitute mailman was unable to find her house. The alprazolam was sent back to [facility]

## 2018-01-02 DIAGNOSIS — S8002XA Contusion of left knee, initial encounter: Secondary | ICD-10-CM | POA: Diagnosis not present

## 2018-01-02 DIAGNOSIS — S79912A Unspecified injury of left hip, initial encounter: Secondary | ICD-10-CM | POA: Diagnosis not present

## 2018-01-02 DIAGNOSIS — S7001XA Contusion of right hip, initial encounter: Secondary | ICD-10-CM | POA: Diagnosis not present

## 2018-01-02 DIAGNOSIS — M25552 Pain in left hip: Secondary | ICD-10-CM | POA: Diagnosis not present

## 2018-01-02 DIAGNOSIS — E119 Type 2 diabetes mellitus without complications: Secondary | ICD-10-CM | POA: Diagnosis not present

## 2018-01-02 DIAGNOSIS — E1140 Type 2 diabetes mellitus with diabetic neuropathy, unspecified: Secondary | ICD-10-CM | POA: Diagnosis not present

## 2018-01-02 DIAGNOSIS — M25562 Pain in left knee: Secondary | ICD-10-CM | POA: Diagnosis not present

## 2018-01-02 DIAGNOSIS — Z7984 Long term (current) use of oral hypoglycemic drugs: Secondary | ICD-10-CM | POA: Diagnosis not present

## 2018-01-02 DIAGNOSIS — I10 Essential (primary) hypertension: Secondary | ICD-10-CM | POA: Diagnosis not present

## 2018-01-08 ENCOUNTER — Encounter: Payer: Self-pay | Admitting: Family Medicine

## 2018-02-02 ENCOUNTER — Ambulatory Visit (INDEPENDENT_AMBULATORY_CARE_PROVIDER_SITE_OTHER): Payer: Medicare Other | Admitting: Orthopaedic Surgery

## 2018-02-06 ENCOUNTER — Encounter (INDEPENDENT_AMBULATORY_CARE_PROVIDER_SITE_OTHER): Payer: Self-pay | Admitting: Orthopaedic Surgery

## 2018-02-09 ENCOUNTER — Other Ambulatory Visit: Payer: Self-pay | Admitting: Neurology

## 2018-02-10 ENCOUNTER — Other Ambulatory Visit: Payer: Self-pay | Admitting: *Deleted

## 2018-02-10 MED ORDER — NORTRIPTYLINE HCL 10 MG PO CAPS: 20.0000 mg | ORAL_CAPSULE | Freq: Every day | ORAL | 0 refills | Status: DC

## 2018-03-17 NOTE — Progress Notes (Signed)
[place] at [facility] [address] [place], [place] [address] [phone]  Date:  03/18/2018   Name:  Jodi Williams   DOB:  [birth year]   MRN:  [id number]  PCP:  Darreld Mclean, MD    Chief Complaint: Diabetes (a1c check); Flu Vaccine; and Bump on Wrist   History of Present Illness:  Jodi Williams is a 67 y.o. very pleasant female patient who presents with the following:  Here today for a periodic recheck History of HTN, DM, asthma, arthritis mostly in her back and knees Last seen here in July when her A1c had gone up as she was not taking trulicity secondary to cost  History of DM- stopped her trulicty due to cost.  Gave her 4 sample pens today.  She is now out of the donut hole and will go back on this med BP is under good control on current regimen of prinzide  prevnar 13 today Ordered dexa scan for her Lab Results  Component Value Date   HGBA1C 9.9 (H) 12/03/2017   Mammo: will do next month  Pap:  Per her GYN, scheduled for next month  Flu:  Today dexa today  She and her husband are back on [employer] However she is not losing any weight and is frustrated She is only taking metformin 1000 BID right now She is not using trulicty as she cannot afford it Prinzide for her BP  No issues with hypoglycemia The lowest she has gotten was 167  BP Readings from Last 3 Encounters:  03/18/18 122/70  12/03/17 112/80  04/07/17 124/78   Wt Readings from Last 3 Encounters:  03/18/18 175 lb (79.4 kg)  12/03/17 180 lb (81.6 kg)  04/07/17 172 lb 12.8 oz (78.4 kg)    Patient Active Problem List   Diagnosis Date Noted  . History of partial knee replacement 11/03/2017  . Chronic pain of left knee 11/03/2017  . Chronic migraine 03/31/2017  . Preop cardiovascular exam 07/12/2015  . Dyspnea 07/12/2015  . Chest pain 07/12/2015  . Restless legs 05/24/2015  . Diabetes ([place]) 08/24/2013  . Other and unspecified hyperlipidemia  09/07/2012  . HTN (hypertension) 04/12/2012  . OA (osteoarthritis) of knee 04/12/2012  . 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 Depression with anxiety 04/12/2012  . H/O gastric ulcer 04/12/2012  . ALLERGIC RHINITIS WITH CONJUNCTIVITIS 09/08/2007  . ASTHMA 07/20/2007    Past Medical History:  Diagnosis Date  . Allergy   . Anxiety   . Arthritis   . Asthma   . Cataract   . COPD (chronic obstructive pulmonary disease) ([place])   . Depression   . Diabetes mellitus ([place])   . Hyperlipidemia   . Hypertension   . Ulcer     Past Surgical History:  Procedure Laterality Date  . BACK SURGERY    . BREAST SURGERY    . EYE SURGERY     cataract  . HAND SURGERY    . KNEE SURGERY      Social History   Tobacco Use  . Smoking status: Never Smoker  . Smokeless tobacco: Never Used  Substance Use Topics  . Alcohol use: Yes    Alcohol/week: 0.0 standard drinks    Comment: Rare  . Drug use: No    Family History  Problem Relation Age of Onset  . Hyperlipidemia Mother   . Hypertension Mother   . Heart disease Mother   . Dementia Mother   .  Arthritis Mother   . Neuropathy Father   . Diabetes Father   . Kidney disease Father     Allergies  Allergen Reactions  . Omnicef [Cefdinir] Rash    Medication list has been reviewed and updated.  Current Outpatient Medications on File Prior to Visit  Medication Sig Dispense Refill  . acetaminophen (TYLENOL 8 HOUR ARTHRITIS PAIN) 650 MG CR tablet Take 650 mg by mouth every 8 (eight) hours as needed for pain.    Marland Kitchen albuterol (PROVENTIL HFA;VENTOLIN HFA) 108 (90 BASE) MCG/ACT inhaler Inhale 2 puffs into the lungs every 4 (four) hours as needed for wheezing or shortness of breath (cough, shortness of breath or wheezing.). 1 Inhaler 1  . ALPRAZolam (XANAX) 1 MG tablet TAKE 1 TABLET BY MOUTH AT  BEDTIME AS NEEDED FOR SLEEP 90 tablet 1  . amoxicillin (AMOXIL) 500 MG capsule Take 4 capsules prior to dental appointment for cleanings.  2  . citalopram  (CELEXA) 20 MG tablet TAKE 1 TABLET BY MOUTH DAILY 30 tablet 1  . diclofenac sodium (VOLTAREN) 1 % GEL Apply 2 g topically 4 (four) times daily. 100 g 3  . Dulaglutide (TRULICITY) 9.83 JA/2.5KN SOPN Inject 0.75 mg into the skin once a week. 4 pen 11  . gabapentin (NEURONTIN) 300 MG capsule Start with 1 cap at bedtime for restless legs.  May titrate up to three times a day gradually if needed 270 capsule 3  . lisinopril-hydrochlorothiazide (PRINZIDE,ZESTORETIC) 10-12.5 MG tablet TAKE 1 TABLET BY MOUTH DAILY 90 tablet 1  . meloxicam (MOBIC) 15 MG tablet Take 1 tablet (15 mg total) by mouth daily. 90 tablet 1  . metFORMIN (GLUCOPHAGE) 500 MG tablet TAKE 2 TABLETS BY MOUTH TWICE DAILY WITH MEALS 360 tablet 3  . methocarbamol (ROBAXIN) 500 MG tablet Take 1 tablet (500 mg total) by mouth daily. Use if needed for leg pain 30 tablet 1  . nortriptyline (PAMELOR) 10 MG capsule Take 2 capsules (20 mg total) by mouth at bedtime. Please call [phone] to schedule follow up for continued refills. 180 capsule 0  . omeprazole (PRILOSEC) 40 MG capsule TAKE 1 CAPSULE BY MOUTH  DAILY 90 capsule 1  . pramipexole (MIRAPEX) 0.25 MG tablet TAKE 1 TABLET BY MOUTH EVERY EVENING AS NEEDED FOR LEG PAIN. MAY TAKE 2ND TABLET IF NEEDED 180 tablet 2  . pravastatin (PRAVACHOL) 20 MG tablet TAKE 1 TABLET BY MOUTH DAILY. MAY TAKE EVERY 2ND OR 3RD DAY IF ACHES 90 tablet 3  . rizatriptan (MAXALT-MLT) 5 MG disintegrating tablet Take 1 tablet (5 mg total) as needed by mouth. May repeat in 2 hours if needed 15 tablet 12  . traMADol (ULTRAM) 50 MG tablet Take 1-2 tablets (50-100 mg total) by mouth every 6 (six) hours as needed. 50 tablet 0  . XIIDRA 5 % SOLN INT 1 GTT INTO OU BID  3   No current facility-administered medications on file prior to visit.     Review of Systems:  As per HPI- otherwise negative.   Physical Examination: Vitals:   03/18/18 0834  BP: 122/70  Pulse: 94  Resp: 16  Temp: 98 F (36.7 C)  SpO2: 96%    Vitals:   03/18/18 0834  Weight: 175 lb (79.4 kg)  Height: 5\' 2"  (1.575 m)   Body mass index is 32.01 kg/m. Ideal Body Weight: Weight in (lb) to have BMI = 25: 136.4  GEN: WDWN, NAD, Non-toxic, A & O x 3, obese, looks well otherwise  HEENT: Atraumatic, Normocephalic.  Neck supple. No masses, No LAD. Bilateral TM wnl, oropharynx normal.  PEERL,EOMI.   Ears and Nose: No external deformity. CV: RRR, No M/G/R. No JVD. No thrill. No extra heart sounds. PULM: CTA B, no wheezes, crackles, rhonchi. No retractions. No resp. distress. No accessory muscle use. EXTR: No c/c/e NEURO Normal gait.  PSYCH: Normally interactive. Conversant. Not depressed or anxious appearing.  Calm demeanor.  Small ganglion cyst right wrist. Non tender Pt wishes to observe for now   Assessment and Plan: Controlled type 2 diabetes mellitus without complication, without long-term current use of insulin ([place]) - Plan: Hemoglobin A1c, Comprehensive metabolic panel  Dyslipidemia - Plan: Lipid panel  Essential hypertension, benign - Plan: Comprehensive metabolic panel, CBC  Obesity (BMI 30.0-34.9) - Plan: DISCONTINUED: Liraglutide -Weight Management (SAXENDA) 18 MG/3ML SOPN  Need for influenza vaccination - Plan: Flu Vaccine QUAD 6+ mos PF IM (Fluarix Quad PF)  Here today for a follow-up check Her A1c has been high.  I had put her on trulicity but not taking due to cost.  We had wanted to try saxenda for her, but had to abandon this idea as well due to cost Gave her samples of trulicity 4.17 for now.  She will contact me once done with these (6 weeks) and I can change her to 1.5 mg strength   BP is under ok control Flu shot given Continue to encourage her to exercise and work on her weight. She is trying  Signed Lamar Blinks, MD   Received her labs- A1c is actually better Lipids are stable She is not taking statin daily due to side effects ?is she taking omega 3 Message to pt   Results for orders  placed or performed in visit on 03/18/18  Hemoglobin A1c  Result Value Ref Range   Hgb A1c MFr Bld 8.8 (H) 4.6 - 6.5 %  Comprehensive metabolic panel  Result Value Ref Range   Sodium 139 135 - 145 mEq/L   Potassium 4.6 3.5 - 5.1 mEq/L   Chloride 104 96 - 112 mEq/L   CO2 25 19 - 32 mEq/L   Glucose, Bld 163 (H) 70 - 99 mg/dL   BUN 14 6 - 23 mg/dL   Creatinine, Ser 0.83 0.40 - 1.20 mg/dL   Total Bilirubin 0.3 0.2 - 1.2 mg/dL   Alkaline Phosphatase 78 39 - 117 U/L   AST 19 0 - 37 U/L   ALT 27 0 - 35 U/L   Total Protein 6.8 6.0 - 8.3 g/dL   Albumin 4.5 3.5 - 5.2 g/dL   Calcium 9.2 8.4 - 10.5 mg/dL   GFR 73.16 >60.00 mL/min  Lipid panel  Result Value Ref Range   Cholesterol 225 (H) 0 - 200 mg/dL   Triglycerides 231.0 (H) 0.0 - 149.0 mg/dL   HDL 41.80 >39.00 mg/dL   VLDL 46.2 (H) 0.0 - 40.0 mg/dL   Total CHOL/HDL Ratio 5    NonHDL 183.45   CBC  Result Value Ref Range   WBC 7.0 4.0 - 10.5 K/uL   RBC 4.46 3.87 - 5.11 Mil/uL   Platelets 237.0 150.0 - 400.0 K/uL   Hemoglobin 12.2 12.0 - 15.0 g/dL   HCT 37.2 36.0 - 46.0 %   MCV 83.4 78.0 - 100.0 fl   MCHC 32.8 30.0 - 36.0 g/dL   RDW 15.3 11.5 - 15.5 %  LDL cholesterol, direct  Result Value Ref Range   Direct LDL 142.0 mg/dL

## 2018-03-18 ENCOUNTER — Encounter: Payer: Self-pay | Admitting: Family Medicine

## 2018-03-18 ENCOUNTER — Other Ambulatory Visit: Payer: Self-pay | Admitting: Family Medicine

## 2018-03-18 ENCOUNTER — Ambulatory Visit (INDEPENDENT_AMBULATORY_CARE_PROVIDER_SITE_OTHER): Payer: Medicare Other | Admitting: Family Medicine

## 2018-03-18 ENCOUNTER — Ambulatory Visit (HOSPITAL_BASED_OUTPATIENT_CLINIC_OR_DEPARTMENT_OTHER)
Admission: RE | Admit: 2018-03-18 | Discharge: 2018-03-18 | Disposition: A | Discharge status: Home / Self Care | Payer: Medicare Other | Source: Ambulatory Visit | Attending: Family Medicine | Admitting: Family Medicine

## 2018-03-18 VITALS — BP 122/70 | HR 94 | Temp 98.0°F | Resp 16 | Ht 62.0 in | Wt 175.0 lb

## 2018-03-18 DIAGNOSIS — I10 Essential (primary) hypertension: Secondary | ICD-10-CM

## 2018-03-18 DIAGNOSIS — E119 Type 2 diabetes mellitus without complications: Secondary | ICD-10-CM

## 2018-03-18 DIAGNOSIS — E2839 Other primary ovarian failure: Secondary | ICD-10-CM | POA: Diagnosis present

## 2018-03-18 DIAGNOSIS — Z23 Encounter for immunization: Secondary | ICD-10-CM

## 2018-03-18 DIAGNOSIS — Z780 Asymptomatic menopausal state: Secondary | ICD-10-CM | POA: Diagnosis not present

## 2018-03-18 DIAGNOSIS — E785 Hyperlipidemia, unspecified: Secondary | ICD-10-CM

## 2018-03-18 DIAGNOSIS — E669 Obesity, unspecified: Secondary | ICD-10-CM

## 2018-03-18 DIAGNOSIS — Z13820 Encounter for screening for osteoporosis: Secondary | ICD-10-CM

## 2018-03-18 DIAGNOSIS — E66811 Obesity, class 1: Secondary | ICD-10-CM

## 2018-03-18 LAB — LDL CHOLESTEROL, DIRECT: Direct LDL: 142 mg/dL

## 2018-03-18 LAB — LIPID PANEL
CHOL/HDL RATIO: 5
CHOLESTEROL: 225 mg/dL — AB (ref 0–200)
HDL: 41.8 mg/dL (ref >39.00)
NONHDL: 183.45
Triglycerides: 231 mg/dL — ABNORMAL HIGH (ref 0.0–149.0)
VLDL: 46.2 mg/dL — AB (ref 0.0–40.0)

## 2018-03-18 LAB — COMPREHENSIVE METABOLIC PANEL
ALT: 27 U/L (ref 0–35)
AST: 19 U/L (ref 0–37)
Albumin: 4.5 g/dL (ref 3.5–5.2)
Alkaline Phosphatase: 78 U/L (ref 39–117)
BUN: 14 mg/dL (ref 6–23)
CHLORIDE: 104 meq/L (ref 96–112)
CO2: 25 mEq/L (ref 19–32)
Calcium: 9.2 mg/dL (ref 8.4–10.5)
Creatinine, Ser: 0.83 mg/dL (ref 0.40–1.20)
GFR: 73.16 mL/min (ref >60.00)
GLUCOSE: 163 mg/dL — AB (ref 70–99)
Potassium: 4.6 mEq/L (ref 3.5–5.1)
SODIUM: 139 meq/L (ref 135–145)
Total Bilirubin: 0.3 mg/dL (ref 0.2–1.2)
Total Protein: 6.8 g/dL (ref 6.0–8.3)

## 2018-03-18 LAB — CBC
HCT: 37.2 % (ref 36.0–46.0)
Hemoglobin: 12.2 g/dL (ref 12.0–15.0)
MCHC: 32.8 g/dL (ref 30.0–36.0)
MCV: 83.4 fl (ref 78.0–100.0)
Platelets: 237 10*3/uL (ref 150.0–400.0)
RBC: 4.46 Mil/uL (ref 3.87–5.11)
RDW: 15.3 % (ref 11.5–15.5)
WBC: 7 10*3/uL (ref 4.0–10.5)

## 2018-03-18 LAB — HEMOGLOBIN A1C: HEMOGLOBIN A1C: 8.8 % — AB (ref 4.6–6.5)

## 2018-03-18 MED ORDER — DULAGLUTIDE 0.75 MG/0.5ML ~~LOC~~ SOAJ: SUBCUTANEOUS | 12 refills | Status: DC

## 2018-03-18 MED ORDER — LIRAGLUTIDE -WEIGHT MANAGEMENT 18 MG/3ML ~~LOC~~ SOPN: 3.0000 mg | PEN_INJECTOR | Freq: Every day | SUBCUTANEOUS | 9 refills | Status: DC

## 2018-03-18 NOTE — Telephone Encounter (Signed)
Pt. requesting trulicity to replace saxenda as saxenda too expensive. We have 0.75mg  and 1.5mg  sample pens in our refrigerator, but no discount cards. Order for trulicity left pended. Routed to PCP to advise.

## 2018-03-18 NOTE — Patient Instructions (Signed)
Good to see you today!   I'll be in touch with your labs asap We are going to try saxenda for you Start with 0.6 mg injected daily- increase by 0.6mg / week until max dose of 3mg . This medication should help with weight loss and also with glucose control Please see me in 3 months for a recheck If you notice low blood sugars decrease your saxenda dose- let me know if any questions

## 2018-03-19 DIAGNOSIS — H04123 Dry eye syndrome of bilateral lacrimal glands: Secondary | ICD-10-CM | POA: Diagnosis not present

## 2018-03-19 DIAGNOSIS — H35033 Hypertensive retinopathy, bilateral: Secondary | ICD-10-CM | POA: Diagnosis not present

## 2018-03-19 DIAGNOSIS — H16223 Keratoconjunctivitis sicca, not specified as Sjogren's, bilateral: Secondary | ICD-10-CM | POA: Diagnosis not present

## 2018-03-19 DIAGNOSIS — E119 Type 2 diabetes mellitus without complications: Secondary | ICD-10-CM | POA: Diagnosis not present

## 2018-03-24 NOTE — Telephone Encounter (Signed)
Dr. Lorelei [place] responded to pt's request for trulicity via mychart.

## 2018-03-30 ENCOUNTER — Other Ambulatory Visit: Payer: Self-pay | Admitting: Family Medicine

## 2018-03-30 ENCOUNTER — Other Ambulatory Visit: Payer: Self-pay | Admitting: Neurology

## 2018-03-30 DIAGNOSIS — F418 Other specified anxiety disorders: Secondary | ICD-10-CM

## 2018-03-30 DIAGNOSIS — R1013 Epigastric pain: Secondary | ICD-10-CM

## 2018-03-30 DIAGNOSIS — G2581 Restless legs syndrome: Secondary | ICD-10-CM

## 2018-04-01 ENCOUNTER — Other Ambulatory Visit: Payer: Self-pay

## 2018-04-01 DIAGNOSIS — G2581 Restless legs syndrome: Secondary | ICD-10-CM

## 2018-04-01 MED ORDER — PRAMIPEXOLE DIHYDROCHLORIDE 0.25 MG PO TABS: ORAL_TABLET | ORAL | 2 refills | Status: DC

## 2018-05-16 ENCOUNTER — Other Ambulatory Visit: Payer: Self-pay | Admitting: Family Medicine

## 2018-05-19 ENCOUNTER — Other Ambulatory Visit: Payer: Self-pay

## 2018-05-19 DIAGNOSIS — E119 Type 2 diabetes mellitus without complications: Secondary | ICD-10-CM

## 2018-05-19 MED ORDER — DULAGLUTIDE 0.75 MG/0.5ML ~~LOC~~ SOAJ: SUBCUTANEOUS | 12 refills | Status: DC

## 2018-05-20 DIAGNOSIS — H6503 Acute serous otitis media, bilateral: Secondary | ICD-10-CM | POA: Diagnosis not present

## 2018-05-20 DIAGNOSIS — J00 Acute nasopharyngitis [common cold]: Secondary | ICD-10-CM | POA: Diagnosis not present

## 2018-05-21 ENCOUNTER — Encounter: Payer: Self-pay | Admitting: Family Medicine

## 2018-05-21 MED ORDER — NORTRIPTYLINE HCL 10 MG PO CAPS: 20.0000 mg | ORAL_CAPSULE | Freq: Every day | ORAL | 1 refills | Status: DC

## 2018-05-22 NOTE — Addendum Note (Signed)
Addended by: Wynonia Musty A on: 05/22/2018 08:59 AM   Modules accepted: Orders

## 2018-05-31 DIAGNOSIS — J40 Bronchitis, not specified as acute or chronic: Secondary | ICD-10-CM | POA: Diagnosis not present

## 2018-05-31 DIAGNOSIS — I10 Essential (primary) hypertension: Secondary | ICD-10-CM | POA: Diagnosis not present

## 2018-05-31 DIAGNOSIS — E119 Type 2 diabetes mellitus without complications: Secondary | ICD-10-CM | POA: Diagnosis not present

## 2018-06-03 ENCOUNTER — Other Ambulatory Visit: Payer: Self-pay

## 2018-06-08 ENCOUNTER — Telehealth: Payer: Self-pay

## 2018-06-08 NOTE — Telephone Encounter (Signed)
Error

## 2018-06-09 ENCOUNTER — Encounter: Payer: Self-pay | Admitting: Family Medicine

## 2018-06-23 DIAGNOSIS — Z8619 Personal history of other infectious and parasitic diseases: Secondary | ICD-10-CM | POA: Diagnosis not present

## 2018-06-23 DIAGNOSIS — Z1231 Encounter for screening mammogram for malignant neoplasm of breast: Secondary | ICD-10-CM | POA: Diagnosis not present

## 2018-06-24 DIAGNOSIS — R87610 Atypical squamous cells of undetermined significance on cytologic smear of cervix (ASC-US): Secondary | ICD-10-CM | POA: Diagnosis not present

## 2018-06-30 DIAGNOSIS — L814 Other melanin hyperpigmentation: Secondary | ICD-10-CM | POA: Diagnosis not present

## 2018-06-30 DIAGNOSIS — L821 Other seborrheic keratosis: Secondary | ICD-10-CM | POA: Diagnosis not present

## 2018-06-30 DIAGNOSIS — Z85828 Personal history of other malignant neoplasm of skin: Secondary | ICD-10-CM | POA: Diagnosis not present

## 2018-07-17 ENCOUNTER — Other Ambulatory Visit: Payer: Self-pay | Admitting: Family Medicine

## 2018-08-14 DIAGNOSIS — N879 Dysplasia of cervix uteri, unspecified: Secondary | ICD-10-CM | POA: Diagnosis not present

## 2018-09-09 NOTE — Telephone Encounter (Signed)
I am in process of scheduling her follow up visit with you. Could we get her trulicity samples ?

## 2018-09-10 ENCOUNTER — Encounter: Payer: Self-pay | Admitting: Family Medicine

## 2018-09-10 ENCOUNTER — Other Ambulatory Visit: Payer: Self-pay

## 2018-09-10 ENCOUNTER — Ambulatory Visit (INDEPENDENT_AMBULATORY_CARE_PROVIDER_SITE_OTHER): Payer: Medicare Other | Admitting: Family Medicine

## 2018-09-10 VITALS — BP 122/80 | HR 86 | Temp 98.2°F | Resp 16 | Ht 62.0 in | Wt 175.0 lb

## 2018-09-10 DIAGNOSIS — G2581 Restless legs syndrome: Secondary | ICD-10-CM

## 2018-09-10 DIAGNOSIS — E119 Type 2 diabetes mellitus without complications: Secondary | ICD-10-CM

## 2018-09-10 DIAGNOSIS — I10 Essential (primary) hypertension: Secondary | ICD-10-CM | POA: Diagnosis not present

## 2018-09-10 DIAGNOSIS — E785 Hyperlipidemia, unspecified: Secondary | ICD-10-CM | POA: Diagnosis not present

## 2018-09-10 DIAGNOSIS — IMO0002 Reserved for concepts with insufficient information to code with codable children: Secondary | ICD-10-CM

## 2018-09-10 DIAGNOSIS — G43709 Chronic migraine without aura, not intractable, without status migrainosus: Secondary | ICD-10-CM

## 2018-09-10 DIAGNOSIS — F418 Other specified anxiety disorders: Secondary | ICD-10-CM

## 2018-09-10 LAB — LIPID PANEL
Cholesterol: 245 mg/dL — ABNORMAL HIGH (ref 0–200)
HDL: 48.6 mg/dL (ref >39.00)
NonHDL: 196.25
Total CHOL/HDL Ratio: 5
Triglycerides: 239 mg/dL — ABNORMAL HIGH (ref 0.0–149.0)
VLDL: 47.8 mg/dL — ABNORMAL HIGH (ref 0.0–40.0)

## 2018-09-10 LAB — BASIC METABOLIC PANEL
BUN: 21 mg/dL (ref 6–23)
CO2: 26 mEq/L (ref 19–32)
Calcium: 9.7 mg/dL (ref 8.4–10.5)
Chloride: 99 mEq/L (ref 96–112)
Creatinine, Ser: 0.99 mg/dL (ref 0.40–1.20)
GFR: 56.08 mL/min — ABNORMAL LOW (ref >60.00)
Glucose, Bld: 127 mg/dL — ABNORMAL HIGH (ref 70–99)
Potassium: 4.6 mEq/L (ref 3.5–5.1)
Sodium: 135 mEq/L (ref 135–145)

## 2018-09-10 LAB — LDL CHOLESTEROL, DIRECT: Direct LDL: 148 mg/dL

## 2018-09-10 LAB — HEMOGLOBIN A1C: Hgb A1c MFr Bld: 7.8 % — ABNORMAL HIGH (ref 4.6–6.5)

## 2018-09-10 MED ORDER — PRAVASTATIN SODIUM 20 MG PO TABS: ORAL_TABLET | ORAL | 3 refills | Status: DC

## 2018-09-10 MED ORDER — CANAGLIFLOZIN 100 MG PO TABS: 100.0000 mg | ORAL_TABLET | Freq: Every day | ORAL | 0 refills | Status: DC

## 2018-09-10 MED ORDER — LISINOPRIL-HYDROCHLOROTHIAZIDE 10-12.5 MG PO TABS: 1.0000 | ORAL_TABLET | Freq: Every day | ORAL | 3 refills | Status: DC

## 2018-09-10 MED ORDER — RIZATRIPTAN BENZOATE 5 MG PO TBDP: 5.0000 mg | ORAL_TABLET | ORAL | 12 refills | Status: DC | PRN

## 2018-09-10 MED ORDER — METFORMIN HCL 1000 MG PO TABS: ORAL_TABLET | ORAL | 3 refills | Status: DC

## 2018-09-10 MED ORDER — ALPRAZOLAM 1 MG PO TABS: 1.0000 mg | ORAL_TABLET | Freq: Every evening | ORAL | 1 refills | Status: DC | PRN

## 2018-09-10 NOTE — Patient Instructions (Addendum)
It was great to see you today as always! I will be in touch with your labs We will stop Trulicity, as it is too expensive Start on Invokana instead, this is a once a day oral medication for diabetes We will start on 100 mg, please use the free trial coupon to get this prescription.  Assuming that you tolerate this okay, when you need a refill let's plan to increase to 300 mg.  It appears that 300 mg is preferred by your insurance program Please just message me when you are coming to the end of your month of Invokana 100 mg and I will call in the 300 mg for you  Please get an eye exam when you are able I sent the rest of your prescriptions to your mail away pharmacy Now that you are over 65, I wonder if we can get you off the Mirapex medication.  Right now I believe you are taking 2 a day.  Please try reducing to 1 a day for a few weeks.  If this is successful, you can then try stopping it entirely I am not sure much this medication is still helping you, and you are at increased risk for side effects now  Please plan to see me in 4-6 months in the clinic for a recheck visit

## 2018-09-10 NOTE — Progress Notes (Addendum)
[place] at [facility] [address], [place], [place] [address] [phone]  Date:  09/10/2018   Name:  Jodi Williams   DOB:  1950/08/14   MRN:  [id number]  PCP:  Darreld Mclean, MD    Chief Complaint: Diabetes (Trulicity samples?, lisinopril, pravachol and maxalt refills)   History of Present Illness:  Jodi Williams is a 68 y.o. very pleasant female patient who presents with the following:  In person visit today for diabetes follow-up She has been staying in [facility] [place] recently during the pandemic.  She came into town today to do a few things and see ud Has history of diabetes, obesity, hypertension, arthritis mostly in her back and knees, asthma I last saw her in November-at that time she was taking metformin, but has stopped Trulicity as it was too expensive I given her some samples of Trulicity which she had been taking until last week.  However she is now in the "donut hole" with her insurance, and cannot continue to take this medication.  It is too expensive . She notes that her glucose is generally under about 150 recently which is good news  In November, her A1c had decreased from 9.9% to 8.8% Lab Results  Component Value Date   HGBA1C 8.8 (H) 03/18/2018   Albuterol as needed- has not needed to use this recently Xanax 1 mg at bedtime as needed; she takes daily  Celexa 20 mg daily Gabapentin- taking 1 BID for her restless legs  Lisinopril HCTZ Mobic as needed Metformin 1000 twice a day Pravastatin 20 Nortriptyline at bedtime-  Asked her about Mirapex.  She is taking 2 of these a day, sometimes 3 for restless legs  She has been to see neurology for chronic migraine headaches-they started her on nortriptyline 20 mg at bedtime, and Maxalt as needed  Foot exam is due Mammogram;done per her GYN in the fall Eye exam; she is due  08/20/2018  1   03/31/2018  Alprazolam 1 MG Tablet  90.00 90 Je Cop    [id number]   Opt (4524)   0  2.00 LME  Medicare   [place]  06/01/2018  1   03/31/2018  Alprazolam 1 MG Tablet  90.00 90 Je Cop   [id number]   Opt (4524)   0  2.00 LME  Medicare   [place]  03/09/2018  2   12/18/2017  Alprazolam 1 MG Tablet  90.00 90 Je Cop   [id number]   Opt (7847)   1  2.00 LME  Comm Ins   [place]  12/18/2017  2   12/18/2017  Alprazolam 1 MG Tablet  90.00 90 Je Cop   [id number]   Opt (7847)   0  2.00 LME  Comm Ins   [place]  09/23/2017  4   06/27/2017  Alprazolam 1 MG Tablet  90.00 90 Je Cop   [id number]   Opt (4524)   1  2.00 LME  Comm Ins   [place]  06/27/2017  4   06/27/2017  Alprazolam 1 MG Tablet  90.00 90 Je Cop   [id number]   Opt (7847)   0  2.00 LME  Comm Ins   [place]  06/10/2017  3   05/26/2017  Alprazolam 1 MG Tablet  30.00 30 Je Cop   [id number]   Wal (5935)   0  2.00 LME  Comm Ins   [place]  05/02/2017  3  03/24/2017  Alprazolam 1 MG Tablet  30.00 30 Je Cop   [id number]   Wal (5935)   1  2.00 LME  Comm Ins   [place]  03/24/2017  3   03/24/2017  Alprazolam 1 MG Tablet  30.00 30 Je Cop   [id number]   Wal (5935)   0  2.00 LME       Patient Active Problem List   Diagnosis Date Noted  . History of partial knee replacement 11/03/2017  . Chronic pain of left knee 11/03/2017  . Chronic migraine 03/31/2017  . Preop cardiovascular exam 07/12/2015  . Dyspnea 07/12/2015  . Chest pain 07/12/2015  . Restless legs 05/24/2015  . Diabetes ([place]) 08/24/2013  . Other and unspecified hyperlipidemia 09/07/2012  . HTN (hypertension) 04/12/2012  . OA (osteoarthritis) of knee 04/12/2012  . 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 Depression with anxiety 04/12/2012  . H/O gastric ulcer 04/12/2012  . ALLERGIC RHINITIS WITH CONJUNCTIVITIS 09/08/2007  . ASTHMA 07/20/2007    Past Medical History:  Diagnosis Date  . Allergy   . Anxiety   . Arthritis   . Asthma   . Cataract   . COPD (chronic obstructive pulmonary disease) ([place])   . Depression   . Diabetes mellitus ([place])   . Hyperlipidemia   . Hypertension   . Ulcer     Past  Surgical History:  Procedure Laterality Date  . BACK SURGERY    . BREAST SURGERY    . EYE SURGERY     cataract  . HAND SURGERY    . KNEE SURGERY      Social History   Tobacco Use  . Smoking status: Never Smoker  . Smokeless tobacco: Never Used  Substance Use Topics  . Alcohol use: Yes    Alcohol/week: 0.0 standard drinks    Comment: Rare  . Drug use: No    Family History  Problem Relation Age of Onset  . Hyperlipidemia Mother   . Hypertension Mother   . Heart disease Mother   . Dementia Mother   . Arthritis Mother   . Neuropathy Father   . Diabetes Father   . Kidney disease Father     Allergies  Allergen Reactions  . Omnicef [Cefdinir] Rash    Medication list has been reviewed and updated.  Current Outpatient Medications on File Prior to Visit  Medication Sig Dispense Refill  . acetaminophen (TYLENOL 8 HOUR ARTHRITIS PAIN) 650 MG CR tablet Take 650 mg by mouth every 8 (eight) hours as needed for pain.    Marland Kitchen albuterol (PROVENTIL HFA;VENTOLIN HFA) 108 (90 BASE) MCG/ACT inhaler Inhale 2 puffs into the lungs every 4 (four) hours as needed for wheezing or shortness of breath (cough, shortness of breath or wheezing.). 1 Inhaler 1  . ALPRAZolam (XANAX) 1 MG tablet TAKE 1 TABLET BY MOUTH AT  BEDTIME AS NEEDED FOR SLEEP 90 tablet 1  . citalopram (CELEXA) 20 MG tablet TAKE 1 TABLET BY MOUTH  DAILY 90 tablet 1  . Dulaglutide (TRULICITY) 6.23 JS/2.8BT SOPN Use one dose subcue weekly 4 pen 12  . gabapentin (NEURONTIN) 300 MG capsule TAKE 1 CAPSULE BY MOUTH AT  BEDTIME FOR RESTLESS LEGS.  MAY TITRATE UP TO 3 TIMES  DAILY GRADUALLY IF NEEDED 270 capsule 3  . lisinopril-hydrochlorothiazide (PRINZIDE,ZESTORETIC) 10-12.5 MG tablet TAKE 1 TABLET BY MOUTH DAILY 90 tablet 1  . meloxicam (MOBIC) 15 MG tablet TAKE 1 TABLET BY MOUTH  DAILY 90 tablet 1  . metFORMIN (GLUCOPHAGE) 500 MG  tablet TAKE 2 TABLETS BY MOUTH TWICE DAILY WITH MEALS 360 tablet 3  . nortriptyline (PAMELOR) 10 MG capsule  Take 2 capsules (20 mg total) by mouth at bedtime. 180 capsule 1  . omeprazole (PRILOSEC) 40 MG capsule TAKE 1 CAPSULE BY MOUTH  DAILY 90 capsule 1  . pramipexole (MIRAPEX) 0.25 MG tablet TAKE 1 TABLET BY MOUTH EVERY EVENING AS NEEDED FOR LEG PAIN. MAY TAKE 2ND TABLET IF NEEDED 180 tablet 2  . pravastatin (PRAVACHOL) 20 MG tablet TAKE 1 TABLET BY MOUTH DAILY. MAY TAKE EVERY 2ND OR 3RD DAY IF ACHES 90 tablet 3  . rizatriptan (MAXALT-MLT) 5 MG disintegrating tablet Take 1 tablet (5 mg total) as needed by mouth. May repeat in 2 hours if needed 15 tablet 12  . traMADol (ULTRAM) 50 MG tablet Take 1-2 tablets (50-100 mg total) by mouth every 6 (six) hours as needed. 50 tablet 0  . XIIDRA 5 % SOLN INT 1 GTT INTO OU BID  3   No current facility-administered medications on file prior to visit.     Review of Systems:  No fever chills, no cough.  She is feeling well  Physical Examination: Vitals:   09/10/18 0913  BP: 122/80  Pulse: 86  Resp: 16  Temp: 98.2 F (36.8 C)  SpO2: 98%   Vitals:   09/10/18 0913  Weight: 175 lb (79.4 kg)  Height: 5\' 2"  (1.575 m)   Body mass index is 32.01 kg/m. Ideal Body Weight: Weight in (lb) to have BMI = 25: 136.4  GEN: WDWN, NAD, Non-toxic, A & O x 3, obese, looks well.  Her normal self HEENT: Atraumatic, Normocephalic. Neck supple. No masses, No LAD. Ears and Nose: No external deformity. CV: RRR, No M/G/R. No JVD. No thrill. No extra heart sounds. PULM: CTA B, no wheezes, crackles, rhonchi. No retractions. No resp. distress. No accessory muscle use. EXTR: No c/c/e NEURO Normal gait.  PSYCH: Normally interactive. Conversant. Not depressed or anxious appearing.  Calm demeanor.  Foot exam is normal  Assessment and Plan: Controlled type 2 diabetes mellitus without complication, without long-term current use of insulin ([facility]) - Plan: metFORMIN (GLUCOPHAGE) 1000 MG tablet, canagliflozin (INVOKANA) 100 MG TABS tablet, Basic metabolic panel, Hemoglobin  A1c  Depression with anxiety - Plan: ALPRAZolam (XANAX) 1 MG tablet  Dyslipidemia - Plan: pravastatin (PRAVACHOL) 20 MG tablet, Lipid panel  Essential hypertension, benign - Plan: lisinopril-hydrochlorothiazide (ZESTORETIC) 10-12.5 MG tablet  Restless legs  Chronic migraine - Plan: rizatriptan (MAXALT-MLT) 5 MG disintegrating tablet  Here today to recheck on diabetes.  She is on 1000 mg of metformin twice daily Have been using Trulicity with success, but it is not affordable We will change to Invokana- it looks like 300 mg is preferred by her insurance plan, would like to start on 100 for tolerability.  I provided a 1 month free trial coupon for the 100 mg strength.  She will try this and let me know how it works for her Encouraged her to work on tapering off Mirapex at this point.  Ideally I would like to have her not taking this and also gabapentin Invited and answered all questions Please see written patient instructions for further details  Will plan further follow- up pending labs.   Signed Lamar Blinks, MD  Received her labs 5/1- message to pt  The 10-year ASCVD risk score Mikey Bussing DC Jr., et al., 2013) is: 16.1%   Values used to calculate the score:     Age: 23  years     Sex: Female     Is Non-Hispanic African American: No     Diabetic: Yes     Tobacco smoker: No     Systolic Blood Pressure: 147 mmHg     Is BP treated: Yes     HDL Cholesterol: 48.6 mg/dL     Total Cholesterol: 245 mg/dL   Results for orders placed or performed in visit on 82/95/62  Basic metabolic panel  Result Value Ref Range   Sodium 135 135 - 145 mEq/L   Potassium 4.6 3.5 - 5.1 mEq/L   Chloride 99 96 - 112 mEq/L   CO2 26 19 - 32 mEq/L   Glucose, Bld 127 (H) 70 - 99 mg/dL   BUN 21 6 - 23 mg/dL   Creatinine, Ser 0.99 0.40 - 1.20 mg/dL   Calcium 9.7 8.4 - 10.5 mg/dL   GFR 56.08 (L) >60.00 mL/min  Hemoglobin A1c  Result Value Ref Range   Hgb A1c MFr Bld 7.8 (H) 4.6 - 6.5 %  Lipid panel   Result Value Ref Range   Cholesterol 245 (H) 0 - 200 mg/dL   Triglycerides 239.0 (H) 0.0 - 149.0 mg/dL   HDL 48.60 >39.00 mg/dL   VLDL 47.8 (H) 0.0 - 40.0 mg/dL   Total CHOL/HDL Ratio 5    NonHDL 196.25   LDL cholesterol, direct  Result Value Ref Range   Direct LDL 148.0 mg/dL

## 2018-09-11 ENCOUNTER — Encounter: Payer: Self-pay | Admitting: Family Medicine

## 2018-09-12 MED ORDER — ROSUVASTATIN CALCIUM 5 MG PO TABS: 5.0000 mg | ORAL_TABLET | Freq: Every day | ORAL | 6 refills | Status: DC

## 2018-09-12 NOTE — Addendum Note (Signed)
Addended by: Lamar Blinks C on: 09/12/2018 08:26 AM   Modules accepted: Orders

## 2018-09-17 ENCOUNTER — Encounter: Payer: Self-pay | Admitting: Family Medicine

## 2018-09-24 ENCOUNTER — Encounter: Payer: Self-pay | Admitting: Family Medicine

## 2018-09-26 ENCOUNTER — Other Ambulatory Visit: Payer: Self-pay | Admitting: Family Medicine

## 2018-09-26 DIAGNOSIS — R1013 Epigastric pain: Secondary | ICD-10-CM

## 2018-09-29 ENCOUNTER — Encounter: Payer: Self-pay | Admitting: Family Medicine

## 2018-09-29 DIAGNOSIS — E119 Type 2 diabetes mellitus without complications: Secondary | ICD-10-CM

## 2018-09-29 MED ORDER — DULAGLUTIDE 0.75 MG/0.5ML ~~LOC~~ SOAJ: SUBCUTANEOUS | 12 refills | Status: DC

## 2018-09-30 ENCOUNTER — Encounter: Payer: Self-pay | Admitting: Family Medicine

## 2018-09-30 DIAGNOSIS — E119 Type 2 diabetes mellitus without complications: Secondary | ICD-10-CM

## 2018-10-01 MED ORDER — GLIPIZIDE 5 MG PO TABS: 5.0000 mg | ORAL_TABLET | Freq: Two times a day (BID) | ORAL | 6 refills | Status: DC

## 2018-10-11 ENCOUNTER — Other Ambulatory Visit: Payer: Self-pay | Admitting: Family Medicine

## 2018-10-14 ENCOUNTER — Other Ambulatory Visit: Payer: Self-pay | Admitting: Family Medicine

## 2018-10-21 DIAGNOSIS — E119 Type 2 diabetes mellitus without complications: Secondary | ICD-10-CM | POA: Diagnosis not present

## 2018-10-21 DIAGNOSIS — M62838 Other muscle spasm: Secondary | ICD-10-CM | POA: Diagnosis not present

## 2018-10-21 DIAGNOSIS — T07XXXA Unspecified multiple injuries, initial encounter: Secondary | ICD-10-CM | POA: Diagnosis not present

## 2018-10-21 DIAGNOSIS — I10 Essential (primary) hypertension: Secondary | ICD-10-CM | POA: Diagnosis not present

## 2018-10-27 DIAGNOSIS — M25552 Pain in left hip: Secondary | ICD-10-CM | POA: Diagnosis not present

## 2018-10-27 DIAGNOSIS — E785 Hyperlipidemia, unspecified: Secondary | ICD-10-CM | POA: Diagnosis not present

## 2018-10-27 DIAGNOSIS — S7012XA Contusion of left thigh, initial encounter: Secondary | ICD-10-CM | POA: Diagnosis not present

## 2018-10-27 DIAGNOSIS — M25512 Pain in left shoulder: Secondary | ICD-10-CM | POA: Diagnosis not present

## 2018-10-27 DIAGNOSIS — S40012A Contusion of left shoulder, initial encounter: Secondary | ICD-10-CM | POA: Diagnosis not present

## 2018-10-27 DIAGNOSIS — E119 Type 2 diabetes mellitus without complications: Secondary | ICD-10-CM | POA: Diagnosis not present

## 2018-10-27 DIAGNOSIS — S300XXA Contusion of lower back and pelvis, initial encounter: Secondary | ICD-10-CM | POA: Diagnosis not present

## 2018-10-27 DIAGNOSIS — Z7984 Long term (current) use of oral hypoglycemic drugs: Secondary | ICD-10-CM | POA: Diagnosis not present

## 2018-10-27 DIAGNOSIS — S79912A Unspecified injury of left hip, initial encounter: Secondary | ICD-10-CM | POA: Diagnosis not present

## 2018-10-27 DIAGNOSIS — S8012XA Contusion of left lower leg, initial encounter: Secondary | ICD-10-CM | POA: Diagnosis not present

## 2018-10-27 DIAGNOSIS — I10 Essential (primary) hypertension: Secondary | ICD-10-CM | POA: Diagnosis not present

## 2018-10-27 DIAGNOSIS — S20222A Contusion of left back wall of thorax, initial encounter: Secondary | ICD-10-CM | POA: Diagnosis not present

## 2018-10-30 DIAGNOSIS — N870 Mild cervical dysplasia: Secondary | ICD-10-CM | POA: Diagnosis not present

## 2018-10-30 DIAGNOSIS — N879 Dysplasia of cervix uteri, unspecified: Secondary | ICD-10-CM | POA: Diagnosis not present

## 2018-11-02 ENCOUNTER — Other Ambulatory Visit: Payer: Self-pay

## 2018-11-02 ENCOUNTER — Ambulatory Visit: Payer: Self-pay

## 2018-11-02 ENCOUNTER — Ambulatory Visit (INDEPENDENT_AMBULATORY_CARE_PROVIDER_SITE_OTHER): Payer: Medicare Other | Admitting: Orthopaedic Surgery

## 2018-11-02 ENCOUNTER — Encounter: Payer: Self-pay | Admitting: Orthopaedic Surgery

## 2018-11-02 DIAGNOSIS — M25562 Pain in left knee: Secondary | ICD-10-CM | POA: Diagnosis not present

## 2018-11-02 DIAGNOSIS — G8929 Other chronic pain: Secondary | ICD-10-CM

## 2018-11-02 DIAGNOSIS — Z96659 Presence of unspecified artificial knee joint: Secondary | ICD-10-CM

## 2018-11-02 MED ORDER — METHYLPREDNISOLONE ACETATE 40 MG/ML IJ SUSP
40.0000 mg | INTRAMUSCULAR | Status: AC | PRN
  Administered 2018-11-02: 40 mg via INTRA_ARTICULAR

## 2018-11-02 MED ORDER — LIDOCAINE HCL 1 % IJ SOLN
3.0000 mL | INTRAMUSCULAR | Status: AC | PRN
  Administered 2018-11-02: 3 mL

## 2018-11-02 NOTE — Progress Notes (Signed)
+   Office Visit Note   Patient: Jodi Williams           Date of Birth: [birth year]           MRN: [id number] Visit Date: 11/02/2018              Requested by: Darreld Mclean, MD [address] [place],  [place] [address] PCP: Darreld Mclean, MD   Assessment & Plan: Visit Diagnoses:  1. Chronic pain of left knee   2. History of partial knee replacement     Plan: I spoke with her about the risk and benefits of steroid injections.  She agreed to have this done.  All question concerns were answered and addressed.  She tolerated the left knee steroid injection as well.  I talked her about the risk of infection as well.  Follow-up is as needed.  If he gets to the point she wants to have surgery scheduled to revise her left knee from a partial knee to a total knee we can see her back.  Follow-Up Instructions: Return if symptoms worsen or fail to improve.   Orders:  Orders Placed This Encounter  Procedures  . Large Joint Inj  . XR Knee 1-2 Views Left   No orders of the defined types were placed in this encounter.     Procedures: Large Joint Inj: L knee on 11/02/2018 10:00 AM Indications: diagnostic evaluation and pain Details: 22 G 1.5 in needle, superolateral approach  Arthrogram: No  Medications: 3 mL lidocaine 1 %; 40 mg methylPREDNISolone acetate 40 MG/ML Outcome: tolerated well, no immediate complications Procedure, treatment alternatives, risks and benefits explained, specific risks discussed. Consent was given by the patient. Immediately prior to procedure a time out was called to verify the correct patient, procedure, equipment, support staff and site/side marked as required. Patient was prepped and draped in the usual sterile fashion.       Clinical Data: No additional findings.   Subjective: Chief Complaint  Patient presents with  . Left Knee - Pain  The patient has a history of bilateral medial compartment partial knee arthroplasties.  This  was done by 1 of my colleagues in town.  She has had chronic pain in the left lung.  No imaging studies in the past have shown any evidence of loosening but she continues to have chronic pain.  She now lives down at [facility] area.  She comes in today with continued knee pain.  She would like to consider revision to a total knee arthroplasty sometime in the winter.  She would like to consider a one-time steroid injection in that left knee today.  She is not a diabetic.  She reports chronic pain globally with the knee.  HPI  Review of Systems She currently denies any headache, chest pain, shortness of breath, fever, chills, nausea, vomiting  Objective: Vital Signs: There were no vitals taken for this visit.  Physical Exam She is alert and orient x3 and in no acute distress Ortho Exam Examination of both her knees show well-healed surgical incisions from medial compartment arthroplasties.  Range of motion of her left knee is full.  There is some slight patellofemoral grinding.  There is global tenderness medially and laterally. Specialty Comments:  No specialty comments available.  Imaging: Xr Knee 1-2 Views Left  Result Date: 11/02/2018 2 views of the left knee show a partial knee arthroplasty with no complicating features.  This is of the  medial compartment.  There is patellofemoral arthritic changes.    PMFS History: Patient Active Problem List   Diagnosis Date Noted  . History of partial knee replacement 11/03/2017  . Chronic pain of left knee 11/03/2017  . Chronic migraine 03/31/2017  . Preop cardiovascular exam 07/12/2015  . Dyspnea 07/12/2015  . Chest pain 07/12/2015  . Restless legs 05/24/2015  . Diabetes ([place]) 08/24/2013  . Other and unspecified hyperlipidemia 09/07/2012  . HTN (hypertension) 04/12/2012  . OA (osteoarthritis) of knee 04/12/2012  . 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 Depression with anxiety 04/12/2012  . H/O gastric ulcer 04/12/2012  . ALLERGIC  RHINITIS WITH CONJUNCTIVITIS 09/08/2007  . ASTHMA 07/20/2007   Past Medical History:  Diagnosis Date  . Allergy   . Anxiety   . Arthritis   . Asthma   . Cataract   . COPD (chronic obstructive pulmonary disease) ([place])   . Depression   . Diabetes mellitus ([place])   . Hyperlipidemia   . Hypertension   . Ulcer     Family History  Problem Relation Age of Onset  . Hyperlipidemia Mother   . Hypertension Mother   . Heart disease Mother   . Dementia Mother   . Arthritis Mother   . Neuropathy Father   . Diabetes Father   . Kidney disease Father     Past Surgical History:  Procedure Laterality Date  . BACK SURGERY    . BREAST SURGERY    . EYE SURGERY     cataract  . HAND SURGERY    . KNEE SURGERY     Social History   Occupational History  . Occupation: Electrical engineer  Tobacco Use  . Smoking status: Never Smoker  . Smokeless tobacco: Never Used  Substance and Sexual Activity  . Alcohol use: Yes    Alcohol/week: 0.0 standard drinks    Comment: Rare  . Drug use: No  . Sexual activity: Yes    Birth control/protection: None

## 2018-11-07 ENCOUNTER — Other Ambulatory Visit: Payer: Self-pay | Admitting: Family Medicine

## 2018-11-07 ENCOUNTER — Encounter: Payer: Self-pay | Admitting: Family Medicine

## 2018-11-09 MED ORDER — CANAGLIFLOZIN 100 MG PO TABS: 100.0000 mg | ORAL_TABLET | Freq: Every day | ORAL | 6 refills | Status: DC

## 2018-12-02 ENCOUNTER — Other Ambulatory Visit: Payer: Self-pay | Admitting: Family Medicine

## 2018-12-02 DIAGNOSIS — G2581 Restless legs syndrome: Secondary | ICD-10-CM

## 2019-01-09 ENCOUNTER — Encounter: Payer: Self-pay | Admitting: Family Medicine

## 2019-01-09 DIAGNOSIS — E119 Type 2 diabetes mellitus without complications: Secondary | ICD-10-CM

## 2019-01-11 MED ORDER — GLIPIZIDE 10 MG PO TABS: 10.0000 mg | ORAL_TABLET | Freq: Two times a day (BID) | ORAL | 3 refills | Status: DC

## 2019-01-11 NOTE — Addendum Note (Signed)
Addended by: Lamar Blinks C on: 01/11/2019 12:18 PM   Modules accepted: Orders

## 2019-02-01 ENCOUNTER — Encounter: Payer: Self-pay | Admitting: Family Medicine

## 2019-02-01 NOTE — Telephone Encounter (Signed)
Should I bring her in earlier this wee if she can?

## 2019-02-03 ENCOUNTER — Ambulatory Visit: Payer: Medicare Other | Admitting: Family Medicine

## 2019-02-04 ENCOUNTER — Encounter: Payer: Self-pay | Admitting: Family Medicine

## 2019-02-06 NOTE — Progress Notes (Signed)
[name] at [facility] [address], [place], [place] [address] [phone]  Date:  02/08/2019   Name:  Jodi Williams   DOB:  03/12/1951   MRN:  [id number]  PCP:  Darreld Mclean, MD    Chief Complaint: No chief complaint on file.   History of Present Illness:  Jodi Williams is a 68 y.o. very pleasant female patient who presents with the following:  Virtual visit today to discuss blood sugar and diabetes management.  Changed from in person as patient was recently exposed to Jodi Williams does not have any symptoms herself thankfully, Williams did not end up getting tested as Williams has felt fine   Patient location is home, provider location is office.  Patient identity confirmed with 2 factors, Williams gives consent for virtual visit today  History of hypertension, diabetes, arthritis, asthma, hyperlipidemia We last visited in April, at that point Williams was using metformin, glipizide and Trulicity but Trulicity was too expensive.  I prescribed Invokana instead  Earlier this month Williams began having low blood sugars-nadir of 59.  I had her stop glipizide and this has not recurred  Lab Results  Component Value Date   HGBA1C 7.8 (H) 09/10/2018   Williams is now taking just invokana and metformin Her glucose has been pretty good unless Williams gets something sweet- Williams is getting some high readings well into the [id number] trulicity worked really well for her, but it was expensive- however Williams is at the point where Williams might just decide to "bite the bullet" and pay for it  Williams would like to come in next week for labs and an office visit    Patient Active Problem List   Diagnosis Date Noted  . History of partial knee replacement 11/03/2017  . Chronic pain of left knee 11/03/2017  . Chronic migraine 03/31/2017  . Preop cardiovascular exam 07/12/2015  . Dyspnea 07/12/2015  . Chest pain 07/12/2015  . Restless legs 05/24/2015  . Diabetes ([place]) 08/24/2013  .  Other and unspecified hyperlipidemia 09/07/2012  . HTN (hypertension) 04/12/2012  . OA (osteoarthritis) of knee 04/12/2012  . 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 Depression with anxiety 04/12/2012  . H/O gastric ulcer 04/12/2012  . ALLERGIC RHINITIS WITH CONJUNCTIVITIS 09/08/2007  . ASTHMA 07/20/2007    Past Medical History:  Diagnosis Date  . Allergy   . Anxiety   . Arthritis   . Asthma   . Cataract   . COPD (chronic obstructive pulmonary disease) ([place])   . Depression   . Diabetes mellitus ([place])   . Hyperlipidemia   . Hypertension   . Ulcer     Past Surgical History:  Procedure Laterality Date  . BACK SURGERY    . BREAST SURGERY    . EYE SURGERY     cataract  . HAND SURGERY    . KNEE SURGERY      Social History   Tobacco Use  . Smoking status: Never Smoker  . Smokeless tobacco: Never Used  Substance Use Topics  . Alcohol use: Yes    Alcohol/week: 0.0 standard drinks    Comment: Rare  . Drug use: No    Family History  Problem Relation Age of Onset  . Hyperlipidemia Mother   . Hypertension Mother   . Heart disease Mother   . Dementia Mother   . Arthritis Mother   . Neuropathy Father   . Diabetes Father   .  Kidney disease Father     Allergies  Allergen Reactions  . Omnicef [Cefdinir] Rash    Medication list has been reviewed and updated.  Current Outpatient Medications on File Prior to Visit  Medication Sig Dispense Refill  . acetaminophen (TYLENOL 8 HOUR ARTHRITIS PAIN) 650 MG CR tablet Take 650 mg by mouth every 8 (eight) hours as needed for pain.    Marland Kitchen albuterol (PROVENTIL HFA;VENTOLIN HFA) 108 (90 BASE) MCG/ACT inhaler Inhale 2 puffs into the lungs every 4 (four) hours as needed for wheezing or shortness of breath (cough, shortness of breath or wheezing.). 1 Inhaler 1  . ALPRAZolam (XANAX) 1 MG tablet Take 1 tablet (1 mg total) by mouth at bedtime as needed. for sleep 90 tablet 1  . canagliflozin (INVOKANA) 100 MG TABS tablet Take 1 tablet (100  mg total) by mouth daily before breakfast. 30 tablet 6  . citalopram (CELEXA) 20 MG tablet TAKE 1 TABLET BY MOUTH  DAILY 90 tablet 1  . gabapentin (NEURONTIN) 300 MG capsule TAKE 1 CAPSULE BY MOUTH AT  BEDTIME FOR RESTLESS LEGS.  MAY TITRATE UP TO 3 TIMES  DAILY GRADUALLY IF NEEDED 270 capsule 3  . glipiZIDE (GLUCOTROL) 10 MG tablet Take 1 tablet (10 mg total) by mouth 2 (two) times daily before a meal. 180 tablet 3  . lisinopril-hydrochlorothiazide (ZESTORETIC) 10-12.5 MG tablet Take 1 tablet by mouth daily. 90 tablet 3  . meloxicam (MOBIC) 15 MG tablet TAKE 1 TABLET BY MOUTH  DAILY 90 tablet 1  . metFORMIN (GLUCOPHAGE) 1000 MG tablet TAKE 1 TABLET BY MOUTH TWICE DAILY WITH MEALS 180 tablet 3  . nortriptyline (PAMELOR) 10 MG capsule TAKE 2 CAPSULES BY MOUTH AT BEDTIME 180 capsule 1  . omeprazole (PRILOSEC) 40 MG capsule TAKE 1 CAPSULE BY MOUTH  DAILY 90 capsule 1  . pramipexole (MIRAPEX) 0.25 MG tablet TAKE 1 TABLET BY MOUTH  EVERY EVENING AS NEEDED FOR LEG PAIN. MAY TAKE 2ND  TABLET IF NEEDED 180 tablet 2  . pravastatin (PRAVACHOL) 20 MG tablet TAKE 1 TABLET BY MOUTH DAILY. MAY TAKE EVERY 2ND OR 3RD DAY IF ACHES 90 tablet 3  . rizatriptan (MAXALT-MLT) 5 MG disintegrating tablet Take 1 tablet (5 mg total) by mouth as needed. May repeat in 2 hours if needed 15 tablet 12  . rosuvastatin (CRESTOR) 5 MG tablet Take 1 tablet (5 mg total) by mouth daily. 30 tablet 6  . traMADol (ULTRAM) 50 MG tablet Take 1-2 tablets (50-100 mg total) by mouth every 6 (six) hours as needed. 50 tablet 0  . XIIDRA 5 % SOLN INT 1 GTT INTO OU BID  3   No current facility-administered medications on file prior to visit.     Review of Systems:  As per HPI- otherwise negative.   Physical Examination: There were no vitals filed for this visit. There were no vitals filed for this visit. There is no height or weight on file to calculate BMI. Ideal Body Weight:    Pt observed over video - Williams looks well No fever noted   Williams is checking her BP and it has been fine per her report although Williams does not have exact numbers on hand   Assessment and Plan: Controlled type 2 diabetes mellitus without complication, without long-term current use of insulin ([place])  Essential hypertension, benign  Dyslipidemia  Restless legs  Following up virtually today to discuss her DM We made her an appt for next week in person (assuming no sx of  illness) so we can do labs - a1c-and adjust her DM regimen accordingly BP well controlled on current regimen per pt     Signed Lamar Blinks, MD

## 2019-02-08 ENCOUNTER — Other Ambulatory Visit: Payer: Self-pay

## 2019-02-08 ENCOUNTER — Ambulatory Visit (INDEPENDENT_AMBULATORY_CARE_PROVIDER_SITE_OTHER): Payer: Medicare Other | Admitting: Family Medicine

## 2019-02-08 ENCOUNTER — Encounter: Payer: Self-pay | Admitting: Family Medicine

## 2019-02-08 DIAGNOSIS — E119 Type 2 diabetes mellitus without complications: Secondary | ICD-10-CM

## 2019-02-08 DIAGNOSIS — G2581 Restless legs syndrome: Secondary | ICD-10-CM

## 2019-02-08 DIAGNOSIS — I10 Essential (primary) hypertension: Secondary | ICD-10-CM

## 2019-02-08 DIAGNOSIS — E785 Hyperlipidemia, unspecified: Secondary | ICD-10-CM | POA: Diagnosis not present

## 2019-02-13 NOTE — Progress Notes (Addendum)
[place] at [facility] [address] [place], [place] [address] [phone]  Date:  02/15/2019   Name:  Jodi Williams   DOB:  February 14, 1951   MRN:  [id number]  PCP:  Darreld Mclean, MD    Chief Complaint: Diabetes (labs) and Flu Vaccine   History of Present Illness:  Jodi Williams is a 68 y.o. very pleasant female patient who presents with the following:  Patient with history of hypertension, diabetes, depression, asthma, hyperlipidemia.  Following up today on her diabetes We had a virtual visit on September 28.  Jodi Williams's blood sugar has been more volatile recently, she had a low blood sugar of 59.  We had her stop glipizide but continue her metformin.  She had done very well on Trulicity in the past, but it was expensive and she cannot continue taking it.  She is also taking Invokana 100 mg  She notes that her glucose is "a little high" off glipizide Her glucose seemed to get low last night- she did not check it, but felt low as dinner was late.  Her glucose was 207 this am    Lab Results  Component Value Date   HGBA1C 7.8 (H) 09/10/2018   Flu vaccine- will give today Eye exam- will do later this month, scheduled  She notes that she has felt depressed and is crying more recently.   She is more isolated during the pandemic-her husband goes out to play golf, but most of her friends are not going out as much No SI or HI  She is on 20 of celexa- can increase to 40, but she is taking a PPI.  Discussed stopping PPI, changing her depression med or continuing same. She wishes to continue celexa She feels that a lot of her sad feelings are due to concern about her blood sugar.  She hopes now that we are addressing that she will start to feel better  We changed her from pravastatin to crestor over the summer to try and improve her lipids.  Will check lipids for her today   Patient Active Problem List   Diagnosis Date Noted  . History  of partial knee replacement 11/03/2017  . Chronic pain of left knee 11/03/2017  . Chronic migraine 03/31/2017  . Preop cardiovascular exam 07/12/2015  . Dyspnea 07/12/2015  . Chest pain 07/12/2015  . Restless legs 05/24/2015  . Diabetes ([place]) 08/24/2013  . Other and unspecified hyperlipidemia 09/07/2012  . HTN (hypertension) 04/12/2012  . OA (osteoarthritis) of knee 04/12/2012  . 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 Depression with anxiety 04/12/2012  . H/O gastric ulcer 04/12/2012  . ALLERGIC RHINITIS WITH CONJUNCTIVITIS 09/08/2007  . ASTHMA 07/20/2007    Past Medical History:  Diagnosis Date  . Allergy   . Anxiety   . Arthritis   . Asthma   . Cataract   . COPD (chronic obstructive pulmonary disease) ([place])   . Depression   . Diabetes mellitus ([place])   . Hyperlipidemia   . Hypertension   . Ulcer     Past Surgical History:  Procedure Laterality Date  . BACK SURGERY    . BREAST SURGERY    . EYE SURGERY     cataract  . HAND SURGERY    . KNEE SURGERY      Social History   Tobacco Use  . Smoking status: Never Smoker  . Smokeless tobacco: Never Used  Substance Use Topics  .  Alcohol use: Yes    Alcohol/week: 0.0 standard drinks    Comment: Rare  . Drug use: No    Family History  Problem Relation Age of Onset  . Hyperlipidemia Mother   . Hypertension Mother   . Heart disease Mother   . Dementia Mother   . Arthritis Mother   . Neuropathy Father   . Diabetes Father   . Kidney disease Father     Allergies  Allergen Reactions  . Omnicef [Cefdinir] Rash    Medication list has been reviewed and updated.  Current Outpatient Medications on File Prior to Visit  Medication Sig Dispense Refill  . acetaminophen (TYLENOL 8 HOUR ARTHRITIS PAIN) 650 MG CR tablet Take 650 mg by mouth every 8 (eight) hours as needed for pain.    Marland Kitchen albuterol (PROVENTIL HFA;VENTOLIN HFA) 108 (90 BASE) MCG/ACT inhaler Inhale 2 puffs into the lungs every 4 (four) hours as needed for  wheezing or shortness of breath (cough, shortness of breath or wheezing.). 1 Inhaler 1  . ALPRAZolam (XANAX) 1 MG tablet Take 1 tablet (1 mg total) by mouth at bedtime as needed. for sleep 90 tablet 1  . canagliflozin (INVOKANA) 100 MG TABS tablet Take 1 tablet (100 mg total) by mouth daily before breakfast. 30 tablet 6  . citalopram (CELEXA) 20 MG tablet TAKE 1 TABLET BY MOUTH  DAILY 90 tablet 1  . gabapentin (NEURONTIN) 300 MG capsule TAKE 1 CAPSULE BY MOUTH AT  BEDTIME FOR RESTLESS LEGS.  MAY TITRATE UP TO 3 TIMES  DAILY GRADUALLY IF NEEDED 270 capsule 3  . lisinopril-hydrochlorothiazide (ZESTORETIC) 10-12.5 MG tablet Take 1 tablet by mouth daily. 90 tablet 3  . meloxicam (MOBIC) 15 MG tablet TAKE 1 TABLET BY MOUTH  DAILY 90 tablet 1  . metFORMIN (GLUCOPHAGE) 1000 MG tablet TAKE 1 TABLET BY MOUTH TWICE DAILY WITH MEALS 180 tablet 3  . nortriptyline (PAMELOR) 10 MG capsule TAKE 2 CAPSULES BY MOUTH AT BEDTIME 180 capsule 1  . omeprazole (PRILOSEC) 40 MG capsule TAKE 1 CAPSULE BY MOUTH  DAILY 90 capsule 1  . pramipexole (MIRAPEX) 0.25 MG tablet TAKE 1 TABLET BY MOUTH  EVERY EVENING AS NEEDED FOR LEG PAIN. MAY TAKE 2ND  TABLET IF NEEDED 180 tablet 2  . pravastatin (PRAVACHOL) 20 MG tablet TAKE 1 TABLET BY MOUTH DAILY. MAY TAKE EVERY 2ND OR 3RD DAY IF ACHES 90 tablet 3  . rizatriptan (MAXALT-MLT) 5 MG disintegrating tablet Take 1 tablet (5 mg total) by mouth as needed. May repeat in 2 hours if needed 15 tablet 12  . rosuvastatin (CRESTOR) 5 MG tablet Take 1 tablet (5 mg total) by mouth daily. 30 tablet 6  . traMADol (ULTRAM) 50 MG tablet Take 1-2 tablets (50-100 mg total) by mouth every 6 (six) hours as needed. 50 tablet 0  . XIIDRA 5 % SOLN INT 1 GTT INTO OU BID  3   No current facility-administered medications on file prior to visit.     Review of Systems:  As per HPI- otherwise negative. No fever or chills, no chest pain or shortness of breath Weight has been stable  Wt Readings from Last  3 Encounters:  02/15/19 174 lb (78.9 kg)  09/10/18 175 lb (79.4 kg)  03/18/18 175 lb (79.4 kg)     Physical Examination: Vitals:   02/15/19 1019  BP: 122/80  Pulse: 97  Resp: 16  Temp: (!) 97.4 F (36.3 C)  SpO2: 97%   Vitals:   02/15/19 1019  Weight: 174 lb (78.9 kg)  Height: 5\' 2"  (1.575 m)   Body mass index is 31.83 kg/m. Ideal Body Weight: Weight in (lb) to have BMI = 25: 136.4  GEN: WDWN, NAD, Non-toxic, A & O x 3, obese, looks well HEENT: Atraumatic, Normocephalic. Neck supple. No masses, No LAD. Ears and Nose: No external deformity. CV: RRR, No M/G/R. No JVD. No thrill. No extra heart sounds. PULM: CTA B, no wheezes, crackles, rhonchi. No retractions. No resp. distress. No accessory muscle use. ABD: S, NT, ND, +BS. No rebound. No HSM. EXTR: No c/c/e NEURO Normal gait.  PSYCH: Normally interactive. Conversant. Not depressed or anxious appearing.  Calm demeanor.    Assessment and Plan: Controlled type 2 diabetes mellitus without complication, without long-term current use of insulin ([place]) - Plan: Comprehensive metabolic panel, Hemoglobin A1c  Dyslipidemia - Plan: Lipid panel  Depression with anxiety - Plan: citalopram (CELEXA) 20 MG tablet  Following up today on diabetes and dyslipidemia. Fredna has been taking metformin, Invokana, glipizide.  She had a couple of low sugars, including one below 60.  We had her stop glipizide, will check her A1c today.  Depending on results plan to either increase Invokana to 300 mg, or go back on Trulicity.  [place] is willing to pay for Trulicity if necessary at this point, as it worked so well for her in the past  We changed her to Crestor over the summer, check lipids today  Refilled her Celexa.  We discussed a change or increase in dosage.  At this point she would like to continue her same medication, hopes that her mood will improve as we get her sugar under better control  Signed Lamar Blinks, MD  Received her labs,  message to patient Her LDL has actually decreased from 148, other triglycerides are very high A1c has gone up Decrease in renal function is also relatively new Results for orders placed or performed in visit on 02/15/19  Comprehensive metabolic panel  Result Value Ref Range   Sodium 136 135 - 145 mEq/L   Potassium 4.9 3.5 - 5.1 mEq/L   Chloride 101 96 - 112 mEq/L   CO2 22 19 - 32 mEq/L   Glucose, Bld 151 (H) 70 - 99 mg/dL   BUN 37 (H) 6 - 23 mg/dL   Creatinine, Ser 1.19 0.40 - 1.20 mg/dL   Total Bilirubin 0.3 0.2 - 1.2 mg/dL   Alkaline Phosphatase 86 39 - 117 U/L   AST 19 0 - 37 U/L   ALT 32 0 - 35 U/L   Total Protein 7.2 6.0 - 8.3 g/dL   Albumin 4.6 3.5 - 5.2 g/dL   Calcium 10.2 8.4 - 10.5 mg/dL   GFR 45.29 (L) >60.00 mL/min  Hemoglobin A1c  Result Value Ref Range   Hgb A1c MFr Bld 8.8 (H) 4.6 - 6.5 %  Lipid panel  Result Value Ref Range   Cholesterol 180 0 - 200 mg/dL   Triglycerides (H) 0.0 - 149.0 mg/dL    456.0 Triglyceride is over 400; calculations on Lipids are invalid.   HDL 40.00 >39.00 mg/dL   Total CHOL/HDL Ratio 5   LDL cholesterol, direct  Result Value Ref Range   Direct LDL 79.0 mg/dL

## 2019-02-15 ENCOUNTER — Encounter: Payer: Self-pay | Admitting: Family Medicine

## 2019-02-15 ENCOUNTER — Ambulatory Visit (INDEPENDENT_AMBULATORY_CARE_PROVIDER_SITE_OTHER): Payer: Medicare Other | Admitting: Family Medicine

## 2019-02-15 ENCOUNTER — Other Ambulatory Visit: Payer: Self-pay

## 2019-02-15 VITALS — BP 122/80 | HR 97 | Temp 97.4°F | Resp 16 | Ht 62.0 in | Wt 174.0 lb

## 2019-02-15 DIAGNOSIS — F418 Other specified anxiety disorders: Secondary | ICD-10-CM | POA: Diagnosis not present

## 2019-02-15 DIAGNOSIS — E785 Hyperlipidemia, unspecified: Secondary | ICD-10-CM | POA: Diagnosis not present

## 2019-02-15 DIAGNOSIS — E119 Type 2 diabetes mellitus without complications: Secondary | ICD-10-CM | POA: Diagnosis not present

## 2019-02-15 LAB — COMPREHENSIVE METABOLIC PANEL
ALT: 32 U/L (ref 0–35)
AST: 19 U/L (ref 0–37)
Albumin: 4.6 g/dL (ref 3.5–5.2)
Alkaline Phosphatase: 86 U/L (ref 39–117)
BUN: 37 mg/dL — ABNORMAL HIGH (ref 6–23)
CO2: 22 mEq/L (ref 19–32)
Calcium: 10.2 mg/dL (ref 8.4–10.5)
Chloride: 101 mEq/L (ref 96–112)
Creatinine, Ser: 1.19 mg/dL (ref 0.40–1.20)
GFR: 45.29 mL/min — ABNORMAL LOW (ref >60.00)
Glucose, Bld: 151 mg/dL — ABNORMAL HIGH (ref 70–99)
Potassium: 4.9 mEq/L (ref 3.5–5.1)
Sodium: 136 mEq/L (ref 135–145)
Total Bilirubin: 0.3 mg/dL (ref 0.2–1.2)
Total Protein: 7.2 g/dL (ref 6.0–8.3)

## 2019-02-15 LAB — HEMOGLOBIN A1C: Hgb A1c MFr Bld: 8.8 % — ABNORMAL HIGH (ref 4.6–6.5)

## 2019-02-15 LAB — LIPID PANEL
Cholesterol: 180 mg/dL (ref 0–200)
HDL: 40 mg/dL (ref >39.00)
Total CHOL/HDL Ratio: 5
Triglycerides: 456 mg/dL — ABNORMAL HIGH (ref 0.0–149.0)

## 2019-02-15 LAB — LDL CHOLESTEROL, DIRECT: Direct LDL: 79 mg/dL

## 2019-02-15 MED ORDER — TRULICITY 0.75 MG/0.5ML ~~LOC~~ SOAJ: 0.7500 mg | SUBCUTANEOUS | 0 refills | Status: DC

## 2019-02-15 MED ORDER — CITALOPRAM HYDROBROMIDE 20 MG PO TABS: 20.0000 mg | ORAL_TABLET | Freq: Every day | ORAL | 3 refills | Status: DC

## 2019-02-15 NOTE — Addendum Note (Signed)
Addended by: Lamar Blinks C on: 02/15/2019 03:26 PM   Modules accepted: Orders

## 2019-02-15 NOTE — Patient Instructions (Addendum)
It was great to see you again today- flu shot given I will be in touch with your a1c- we will plan to increase invokana OR go back on trulicity depending on your A1c level  Take care!

## 2019-02-17 ENCOUNTER — Encounter: Payer: Self-pay | Admitting: Family Medicine

## 2019-02-17 NOTE — Telephone Encounter (Signed)
I have faxed the rx for trulicty as you requested. It did go to [facility]-however could you advise on the a1c?

## 2019-02-18 MED ORDER — ICOSAPENT ETHYL 1 G PO CAPS: 2.0000 | ORAL_CAPSULE | Freq: Two times a day (BID) | ORAL | 3 refills | Status: DC

## 2019-02-18 NOTE — Addendum Note (Signed)
Addended by: Lamar Blinks C on: 02/18/2019 12:37 PM   Modules accepted: Orders

## 2019-02-18 NOTE — Addendum Note (Signed)
Addended by: Lamar Blinks C on: 02/18/2019 04:07 PM   Modules accepted: Orders

## 2019-03-02 ENCOUNTER — Other Ambulatory Visit: Payer: Self-pay | Admitting: Family Medicine

## 2019-03-03 DIAGNOSIS — H04123 Dry eye syndrome of bilateral lacrimal glands: Secondary | ICD-10-CM | POA: Diagnosis not present

## 2019-03-03 DIAGNOSIS — E119 Type 2 diabetes mellitus without complications: Secondary | ICD-10-CM | POA: Diagnosis not present

## 2019-03-03 DIAGNOSIS — H16223 Keratoconjunctivitis sicca, not specified as Sjogren's, bilateral: Secondary | ICD-10-CM | POA: Diagnosis not present

## 2019-03-03 DIAGNOSIS — H35033 Hypertensive retinopathy, bilateral: Secondary | ICD-10-CM | POA: Diagnosis not present

## 2019-03-13 ENCOUNTER — Encounter: Payer: Self-pay | Admitting: Family Medicine

## 2019-03-13 ENCOUNTER — Other Ambulatory Visit: Payer: Self-pay | Admitting: Family Medicine

## 2019-03-13 DIAGNOSIS — R1013 Epigastric pain: Secondary | ICD-10-CM

## 2019-03-13 DIAGNOSIS — F418 Other specified anxiety disorders: Secondary | ICD-10-CM

## 2019-03-15 ENCOUNTER — Encounter: Payer: Self-pay | Admitting: Family Medicine

## 2019-03-15 MED ORDER — ALPRAZOLAM 1 MG PO TABS: 1.0000 mg | ORAL_TABLET | Freq: Every evening | ORAL | 1 refills | Status: DC | PRN

## 2019-03-15 MED ORDER — OMEPRAZOLE 40 MG PO CPDR: 40.0000 mg | DELAYED_RELEASE_CAPSULE | Freq: Every day | ORAL | 3 refills | Status: DC

## 2019-03-15 NOTE — Addendum Note (Signed)
Addended by: Lamar Blinks C on: 03/15/2019 05:17 AM   Modules accepted: Orders

## 2019-03-17 ENCOUNTER — Telehealth: Payer: Self-pay

## 2019-03-17 NOTE — Telephone Encounter (Signed)
Forms have been faxed 

## 2019-03-17 NOTE — Telephone Encounter (Signed)
Copied from [place] [phone]. Topic: General - Inquiry >> Mar 17, 2019 11:50 AM Mathis [place] wrote: Reason for CRM: Roxanne from  [facility] to check status on the forms she faxed over, patient also sent mychart message. Call back [phone]

## 2019-04-07 ENCOUNTER — Encounter: Payer: Self-pay | Admitting: Family Medicine

## 2019-04-07 DIAGNOSIS — E119 Type 2 diabetes mellitus without complications: Secondary | ICD-10-CM

## 2019-04-07 MED ORDER — TRULICITY 0.75 MG/0.5ML ~~LOC~~ SOAJ: 0.7500 mg | SUBCUTANEOUS | 0 refills | Status: DC

## 2019-04-29 ENCOUNTER — Other Ambulatory Visit: Payer: Self-pay

## 2019-04-29 ENCOUNTER — Encounter: Payer: Self-pay | Admitting: Family Medicine

## 2019-04-29 DIAGNOSIS — E119 Type 2 diabetes mellitus without complications: Secondary | ICD-10-CM

## 2019-04-29 MED ORDER — CANAGLIFLOZIN 100 MG PO TABS: 100.0000 mg | ORAL_TABLET | Freq: Every day | ORAL | 6 refills | Status: DC

## 2019-04-29 MED ORDER — TRULICITY 0.75 MG/0.5ML ~~LOC~~ SOAJ: 0.7500 mg | SUBCUTANEOUS | 5 refills | Status: DC

## 2019-04-29 MED ORDER — ROSUVASTATIN CALCIUM 5 MG PO TABS: 5.0000 mg | ORAL_TABLET | Freq: Every day | ORAL | 6 refills | Status: DC

## 2019-04-29 MED ORDER — TRULICITY 0.75 MG/0.5ML ~~LOC~~ SOAJ: 0.7500 mg | SUBCUTANEOUS | 0 refills | Status: DC

## 2019-04-29 NOTE — Telephone Encounter (Signed)
Last OV 02/15/19 Last fill for Trulicity [id number]  [id number] Last fill for Invokana 11/09/18  #30/6 Last fill for Rosuvastatin 09/12/18 #30/6  MADE IN ERROR REQUEST SENT ALREADY

## 2019-06-16 ENCOUNTER — Other Ambulatory Visit: Payer: Self-pay | Admitting: Family Medicine

## 2019-06-16 DIAGNOSIS — E119 Type 2 diabetes mellitus without complications: Secondary | ICD-10-CM

## 2019-06-16 DIAGNOSIS — I10 Essential (primary) hypertension: Secondary | ICD-10-CM

## 2019-06-16 DIAGNOSIS — G2581 Restless legs syndrome: Secondary | ICD-10-CM

## 2019-06-17 ENCOUNTER — Other Ambulatory Visit: Payer: Self-pay

## 2019-06-17 MED ORDER — ROSUVASTATIN CALCIUM 5 MG PO TABS: 5.0000 mg | ORAL_TABLET | Freq: Every day | ORAL | 6 refills | Status: DC

## 2019-06-29 ENCOUNTER — Encounter: Payer: Self-pay | Admitting: Family Medicine

## 2019-06-30 MED ORDER — ROSUVASTATIN CALCIUM 5 MG PO TABS: 5.0000 mg | ORAL_TABLET | Freq: Every day | ORAL | 6 refills | Status: DC

## 2019-06-30 MED ORDER — NORTRIPTYLINE HCL 10 MG PO CAPS: 20.0000 mg | ORAL_CAPSULE | Freq: Every day | ORAL | 3 refills | Status: DC

## 2019-07-11 ENCOUNTER — Other Ambulatory Visit: Payer: Self-pay | Admitting: Family Medicine

## 2019-07-11 DIAGNOSIS — F418 Other specified anxiety disorders: Secondary | ICD-10-CM

## 2019-07-13 ENCOUNTER — Encounter: Payer: Self-pay | Admitting: Family Medicine

## 2019-07-13 NOTE — Telephone Encounter (Signed)
Requesting:Alprazolam Contract:none [id number] Last Visit:02/15/2019 Next Visit:none Last Refill:03/15/2019  Please Advise

## 2019-08-20 ENCOUNTER — Other Ambulatory Visit: Payer: Self-pay | Admitting: Family Medicine

## 2019-08-20 DIAGNOSIS — E119 Type 2 diabetes mellitus without complications: Secondary | ICD-10-CM

## 2019-08-27 NOTE — Progress Notes (Signed)
Nurse connected with patient 08/30/19 at  1:45 PM EDT by a telephone enabled telemedicine application and verified that I am speaking with the correct person using two identifiers. Patient stated full name and DOB. Patient gave permission to continue with virtual visit. Patient's location was at home and Nurse's location was at [place] office.   Subjective:   Jodi Williams is a 69 y.o. female who presents for an Initial Medicare Annual Wellness Visit.  The Patient was informed that the wellness visit is to identify future health risk and educate and initiate measures that can reduce risk for increased disease through the lifespan.   Describes health as fair, good or great? Good.  Review of Systems    Home Safety/Smoke Alarms: Feels safe in home. Smoke alarms in place.  Lives w/ husband in 1 story home.    Female:         Mammo-pt states scheduled 09/09/19     Dexa scan-03/18/18        [facility]- Cologuard 05/27/17-negative Eye- Dr.Digby yearly. UTD per pt.    Objective:     Advanced Directives 08/30/2019 09/01/2014  Does Patient Have a Medical Advance Directive? No No  Would patient like information on creating a medical advance directive? No - Patient declined -    Current Medications (verified) Outpatient Encounter Medications as of 08/30/2019  Medication Sig  . acetaminophen (TYLENOL 8 HOUR ARTHRITIS PAIN) 650 MG CR tablet Take 650 mg by mouth every 8 (eight) hours as needed for pain.  Marland Kitchen albuterol (PROVENTIL HFA;VENTOLIN HFA) 108 (90 BASE) MCG/ACT inhaler Inhale 2 puffs into the lungs every 4 (four) hours as needed for wheezing or shortness of breath (cough, shortness of breath or wheezing.).  Marland Kitchen ALPRAZolam (XANAX) 1 MG tablet Take 1 tablet (1 mg total) by mouth at bedtime as needed. for sleep  . citalopram (CELEXA) 20 MG tablet Take 1 tablet (20 mg total) by mouth daily.  Marland Kitchen gabapentin (NEURONTIN) 300 MG capsule TAKE 1 CAPSULE BY MOUTH AT  BEDTIME FOR RESTLESS LEGS.  MAY TITRATE UP TO 3  TIMES  DAILY GRADUALLY IF NEEDED  . lisinopril-hydrochlorothiazide (ZESTORETIC) 10-12.5 MG tablet TAKE 1 TABLET BY MOUTH  DAILY  . meloxicam (MOBIC) 15 MG tablet TAKE 1 TABLET BY MOUTH  DAILY  . metFORMIN (GLUCOPHAGE) 1000 MG tablet TAKE 1 TABLET BY MOUTH  TWICE DAILY WITH MEALS  . nortriptyline (PAMELOR) 10 MG capsule Take 2 capsules (20 mg total) by mouth at bedtime.  Marland Kitchen omeprazole (PRILOSEC) 40 MG capsule Take 1 capsule (40 mg total) by mouth daily.  . pramipexole (MIRAPEX) 0.25 MG tablet TAKE 1 TABLET BY MOUTH  EVERY EVENING AS NEEDED FOR LEG PAIN. MAY TAKE 2ND  TABLET IF NEEDED  . rizatriptan (MAXALT-MLT) 5 MG disintegrating tablet Take 1 tablet (5 mg total) by mouth as needed. May repeat in 2 hours if needed  . rosuvastatin (CRESTOR) 5 MG tablet Take 1 tablet (5 mg total) by mouth daily.  . TRULICITY [id number] [id number] SOPN INJECT SUBCUTANEOUSLY THE  CONTENTS OF 1 PEN WEEKLY AS DIRECTED  . XIIDRA 5 % SOLN INT 1 GTT INTO OU BID  . canagliflozin (INVOKANA) 100 MG TABS tablet Take 1 tablet (100 mg total) by mouth daily before breakfast. (Patient not taking: Reported on 08/30/2019)  . traMADol (ULTRAM) 50 MG tablet Take 1-2 tablets (50-100 mg total) by mouth every 6 (six) hours as needed. (Patient not taking: Reported on 08/30/2019)   No facility-administered encounter medications on file as of 08/30/2019.  Allergies (verified) Omnicef [cefdinir]   History: Past Medical History:  Diagnosis Date  . Allergy   . Anxiety   . Arthritis   . Asthma   . Cataract   . COPD (chronic obstructive pulmonary disease) ([place])   . Depression   . Diabetes mellitus ([place])   . Hyperlipidemia   . Hypertension   . Ulcer    Past Surgical History:  Procedure Laterality Date  . BACK SURGERY    . BREAST SURGERY    . EYE SURGERY     cataract  . HAND SURGERY    . KNEE SURGERY     Family History  Problem Relation Age of Onset  . Hyperlipidemia Mother   . Hypertension Mother   . Heart disease Mother   .  Dementia Mother   . Arthritis Mother   . Neuropathy Father   . Diabetes Father   . Kidney disease Father    Social History   Socioeconomic History  . Marital status: Married    Spouse name: Not on file  . Number of children: 1  . Years of education: Not on file  . Highest education level: Not on file  Occupational History  . Occupation: Electrical engineer  Tobacco Use  . Smoking status: Never Smoker  . Smokeless tobacco: Never Used  Substance and Sexual Activity  . Alcohol use: Yes    Alcohol/week: 0.0 standard drinks    Comment: Rare  . Drug use: No  . Sexual activity: Yes    Birth control/protection: None  Other Topics Concern  . Not on file  Social History Narrative  . Not on file   Social Determinants of Health   Financial Resource Strain: Low Risk   . Difficulty of Paying Living Expenses: Not hard at all  Food Insecurity: No Food Insecurity  . Worried About Charity fundraiser in the Last Year: Never true  . Ran Out of Food in the Last Year: Never true  Transportation Needs: No Transportation Needs  . Lack of Transportation (Medical): No  . Lack of Transportation (Non-Medical): No  Physical Activity:   . Days of Exercise per Week:   . Minutes of Exercise per Session:   Stress:   . Feeling of Stress :   Social Connections:   . Frequency of Communication with Friends and Family:   . Frequency of Social Gatherings with Friends and Family:   . Attends Religious Services:   . Active Member of Clubs or Organizations:   . Attends Archivist Meetings:   Marland Kitchen Marital Status:     Tobacco Counseling Counseling given: Not Answered   Clinical Intake: Pain : No/denies pain    Activities of Daily Living In your present state of health, do you have any difficulty performing the following activities: 08/30/2019  Hearing? N  Vision? N  Difficulty concentrating or making decisions? N  Walking or climbing stairs? N  Dressing or bathing? N  Doing errands,  shopping? N  Preparing Food and eating ? N  Using the Toilet? N  In the past six months, have you accidently leaked urine? N  Do you have problems with loss of bowel control? N  Managing your Medications? N  Managing your Finances? N  Housekeeping or managing your Housekeeping? N  Some recent data might be hidden     Immunizations and Health Maintenance Immunization History  Administered Date(s) Administered  . Influenza, Seasonal, Injecte, Preservative Fre 04/12/2012  . Influenza,inj,Quad PF,6+ Mos 04/04/2013, 01/24/2014, 01/18/2015,  03/28/2016, 04/07/2017, 03/18/2018  . Influenza,inj,quad, With Preservative 03/13/2017  . Pneumococcal Conjugate-13 12/03/2017  . Pneumococcal Polysaccharide-23 05/16/2014  . Td 12/03/2017  . Tdap 10/14/2007  . Zoster 10/18/2015   Health Maintenance Due  Topic Date Due  . COVID-19 Vaccine (1) Never done  . OPHTHALMOLOGY EXAM  03/12/2018  . PNA vac Low Risk Adult (2 of 2 - PPSV23) 05/17/2019  . HEMOGLOBIN A1C  08/16/2019    Patient Care Team: Copland, Gay Filler, MD as PCP - General (Family Medicine) Lomax, Marny Lowenstein, MD (Inactive) as Attending Physician (Obstetrics and Gynecology)  Indicate any recent Medical Services you may have received from other than [facility] providers in the past year (date may be approximate).     Assessment:   This is a routine wellness examination for [place]. Physical assessment deferred to PCP.  Hearing/Vision screen Unable to assess. This visit is enabled though telemedicine due to Covid 19.  Dietary issues and exercise activities discussed: Current Exercise Habits: The patient does not participate in regular exercise at present, Exercise limited by: None identified Diet (meal preparation, eat out, water intake, caffeinated beverages, dairy products, fruits and vegetables): in general, an "unhealthy" diet  Goals    . Increase physical activity      Depression Screen PHQ 2/9 Scores 08/30/2019 04/07/2017 04/01/2016  10/18/2015 05/24/2015 05/16/2015 04/12/2015  PHQ - 2 Score 0 0 0 0 0 0 0    Fall Risk Fall Risk  08/30/2019 04/07/2017 04/01/2016 10/18/2015 07/05/2015  Falls in the past year? 0 No No Yes No  Number falls in past yr: 0 - - 2 or more -  Comment - - - Fallls occured before having knee surgery -  Injury with Fall? 0 - - No -  Comment - - - - -  Follow up Education provided;Falls prevention discussed - - - -    Cognitive Function: Ad8 score reviewed for issues:  Issues making decisions:no  Less interest in hobbies / activities:no  Repeats questions, stories (family complaining):no  Trouble using ordinary gadgets (microwave, computer, phone):no  Forgets the month or year: no  Mismanaging finances: no  Remembering appts:no  Daily problems with thinking and/or memory:no Ad8 score is=0       Screening Tests Health Maintenance  Topic Date Due  . COVID-19 Vaccine (1) Never done  . OPHTHALMOLOGY EXAM  03/12/2018  . PNA vac Low Risk Adult (2 of 2 - PPSV23) 05/17/2019  . HEMOGLOBIN A1C  08/16/2019  . FOOT EXAM  09/10/2019  . INFLUENZA VACCINE  12/12/2019  . MAMMOGRAM  03/13/2020  . Fecal DNA (Cologuard)  05/27/2020  . TETANUS/TDAP  12/04/2027  . DEXA SCAN  Completed  . Hepatitis C Screening  Completed      Plan:    Please schedule your next medicare wellness visit with me in 1 yr.  Continue to eat heart healthy diet (full of fruits, vegetables, whole grains, lean protein, water--limit salt, fat, and sugar intake) and increase physical activity as tolerated.  Continue doing brain stimulating activities (puzzles, reading, adult coloring books, staying active) to keep memory sharp.     I have personally reviewed and noted the following in the patient's chart:   . Medical and social history . Use of alcohol, tobacco or illicit drugs  . Current medications and supplements . Functional ability and status . Nutritional status . Physical activity . Advanced directives . List  of other physicians . Hospitalizations, surgeries, and ER visits in previous 12 months . Vitals . Screenings  to include cognitive, depression, and falls . Referrals and appointments  In addition, I have reviewed and discussed with patient certain preventive protocols, quality metrics, and best practice recommendations. A written personalized care plan for preventive services as well as general preventive health recommendations were provided to patient.     Shela Nevin, [place]   08/30/2019

## 2019-08-30 ENCOUNTER — Other Ambulatory Visit: Payer: Self-pay

## 2019-08-30 ENCOUNTER — Ambulatory Visit (INDEPENDENT_AMBULATORY_CARE_PROVIDER_SITE_OTHER): Payer: Medicare Other | Admitting: *Deleted

## 2019-08-30 ENCOUNTER — Encounter: Payer: Self-pay | Admitting: *Deleted

## 2019-08-30 DIAGNOSIS — Z0000 Encounter for general adult medical examination without abnormal findings: Secondary | ICD-10-CM

## 2019-08-30 NOTE — Patient Instructions (Signed)
Please schedule your next medicare wellness visit with me in 1 yr.  Continue to eat heart healthy diet (full of fruits, vegetables, whole grains, lean protein, water--limit salt, fat, and sugar intake) and increase physical activity as tolerated.  Continue doing brain stimulating activities (puzzles, reading, adult coloring books, staying active) to keep memory sharp.    Jodi Williams , Thank you for taking time to come for your Medicare Wellness Visit. I appreciate your ongoing commitment to your health goals. Please review the following plan we discussed and let me know if I can assist you in the future.   These are the goals we discussed: Goals    . Increase physical activity       This is a list of the screening recommended for you and due dates:  Health Maintenance  Topic Date Due  . COVID-19 Vaccine (1) Never done  . Eye exam for diabetics  03/12/2018  . Pneumonia vaccines (2 of 2 - PPSV23) 05/17/2019  . Hemoglobin A1C  08/16/2019  . Complete foot exam   09/10/2019  . Flu Shot  12/12/2019  . Mammogram  03/13/2020  . Cologuard (Stool DNA test)  05/27/2020  . Tetanus Vaccine  12/04/2027  . DEXA scan (bone density measurement)  Completed  .  Hepatitis C: One time screening is recommended by Center for Disease Control  (CDC) for  adults born from [birth year] through 1963.   Completed    Preventive Care 37 Years and Older, Female Preventive care refers to lifestyle choices and visits with your health care provider that can promote health and wellness. This includes:  A yearly physical exam. This is also called an annual well check.  Regular dental and eye exams.  Immunizations.  Screening for certain conditions.  Healthy lifestyle choices, such as diet and exercise. What can I expect for my preventive care visit? Physical exam Your health care provider will check:  Height and weight. These may be used to calculate body mass index (BMI), which is a measurement that tells if you  are at a healthy weight.  Heart rate and blood pressure.  Your skin for abnormal spots. Counseling Your health care provider may ask you questions about:  Alcohol, tobacco, and drug use.  Emotional well-being.  Home and relationship well-being.  Sexual activity.  Eating habits.  History of falls.  Memory and ability to understand (cognition).  Work and work Statistician.  Pregnancy and menstrual history. What immunizations do I need?  Influenza (flu) vaccine  This is recommended every year. Tetanus, diphtheria, and pertussis (Tdap) vaccine  You may need a Td booster every 10 years. Varicella (chickenpox) vaccine  You may need this vaccine if you have not already been vaccinated. Zoster (shingles) vaccine  You may need this after age 58. Pneumococcal conjugate (PCV13) vaccine  One dose is recommended after age 90. Pneumococcal polysaccharide (PPSV23) vaccine  One dose is recommended after age 52. Measles, mumps, and rubella (MMR) vaccine  You may need at least one dose of MMR if you were born in 1957 or later. You may also need a second dose. Meningococcal conjugate (MenACWY) vaccine  You may need this if you have certain conditions. Hepatitis A vaccine  You may need this if you have certain conditions or if you travel or work in places where you may be exposed to hepatitis A. Hepatitis B vaccine  You may need this if you have certain conditions or if you travel or work in places where you may be  exposed to hepatitis B. Haemophilus influenzae type b (Hib) vaccine  You may need this if you have certain conditions. You may receive vaccines as individual doses or as more than one vaccine together in one shot (combination vaccines). Talk with your health care provider about the risks and benefits of combination vaccines. What tests do I need? Blood tests  Lipid and cholesterol levels. These may be checked every 5 years, or more frequently depending on your  overall health.  Hepatitis C test.  Hepatitis B test. Screening  Lung cancer screening. You may have this screening every year starting at age 68 if you have a 30-pack-year history of smoking and currently smoke or have quit within the past 15 years.  Colorectal cancer screening. All adults should have this screening starting at age 67 and continuing until age 69. Your health care provider may recommend screening at age 66 if you are at increased risk. You will have tests every 1-10 years, depending on your results and the type of screening test.  Diabetes screening. This is done by checking your blood sugar (glucose) after you have not eaten for a while (fasting). You may have this done every 1-3 years.  Mammogram. This may be done every 1-2 years. Talk with your health care provider about how often you should have regular mammograms.  BRCA-related cancer screening. This may be done if you have a family history of breast, ovarian, tubal, or peritoneal cancers. Other tests  Sexually transmitted disease (STD) testing.  Bone density scan. This is done to screen for osteoporosis. You may have this done starting at age 76. Follow these instructions at home: Eating and drinking  Eat a diet that includes fresh fruits and vegetables, whole grains, lean protein, and low-fat dairy products. Limit your intake of foods with high amounts of sugar, saturated fats, and salt.  Take vitamin and mineral supplements as recommended by your health care provider.  Do not drink alcohol if your health care provider tells you not to drink.  If you drink alcohol: ? Limit how much you have to 0-1 drink a day. ? Be aware of how much alcohol is in your drink. In the [place] one drink equals one 12 oz bottle of beer (355 mL), one 5 oz glass of wine (148 mL), or one 1 oz glass of hard liquor (44 mL). Lifestyle  Take daily care of your teeth and gums.  Stay active. Exercise for at least 30 minutes on 5 or more  days each week.  Do not use any products that contain nicotine or tobacco, such as cigarettes, e-cigarettes, and chewing tobacco. If you need help quitting, ask your health care provider.  If you are sexually active, practice safe sex. Use a condom or other form of protection in order to prevent STIs (sexually transmitted infections).  Talk with your health care provider about taking a low-dose aspirin or statin. What's next?  Go to your health care provider once a year for a well check visit.  Ask your health care provider how often you should have your eyes and teeth checked.  Stay up to date on all vaccines. This information is not intended to replace advice given to you by your health care provider. Make sure you discuss any questions you have with your health care provider. Document Revised: 04/23/2018 Document Reviewed: 04/23/2018 Elsevier Patient Education  2020 [employer].

## 2019-09-05 NOTE — Progress Notes (Addendum)
[place] [facility] [place], [address], [place] [address] [phone]  Date:  09/08/2019   Name:  Jodi Williams   DOB:  07-23-1950   MRN:  [id number]  PCP:  Darreld Mclean, MD    Chief Complaint: Diabetes (6 month follow up, glucose)   History of Present Illness:  Jodi Williams is a 69 y.o. very pleasant female patient who presents with the following:  Here today for periodic follow-up visit Patient with history of hypertension, diabetes, depression, asthma, hyperlipidemia, arthritis status post knee replacement Last seen by myself in September of 2020 She and her husband are in the [place] of [place] at [place]- her daughter lives here so this is bittersweet  COVID-19 vaccine Eye exam- UTD Due for labs and A1c- she is fasting today  Mammogram; she has her appt tomorrow for GYN and will do mammo there.     She has noted some increase of her blood sugar recently, has made some alterations to her regimen- Albuterol as needed Invokana- no longer taking, too expensive Metformin Trulicity- taking this, just started back Lisinopril HCTZ Crestor Gabapentin Celexa Nortriptyline Xanax Went back on glipizide 10 BID recently as well  Patient Active Problem List   Diagnosis Date Noted  . History of partial knee replacement 11/03/2017  . Chronic pain of left knee 11/03/2017  . Chronic migraine 03/31/2017  . Preop cardiovascular exam 07/12/2015  . Dyspnea 07/12/2015  . Chest pain 07/12/2015  . Restless legs 05/24/2015  . Diabetes ([place]) 08/24/2013  . Other and unspecified hyperlipidemia 09/07/2012  . HTN (hypertension) 04/12/2012  . OA (osteoarthritis) of knee 04/12/2012  . 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 Depression with anxiety 04/12/2012  . H/O gastric ulcer 04/12/2012  . ALLERGIC RHINITIS WITH CONJUNCTIVITIS 09/08/2007  . ASTHMA 07/20/2007    Past Medical History:  Diagnosis  Date  . Allergy   . Anxiety   . Arthritis   . Asthma   . Cataract   . COPD (chronic obstructive pulmonary disease) ([place])   . Depression   . Diabetes mellitus ([place])   . Hyperlipidemia   . Hypertension   . Ulcer     Past Surgical History:  Procedure Laterality Date  . BACK SURGERY    . BREAST SURGERY    . EYE SURGERY     cataract  . HAND SURGERY    . KNEE SURGERY      Social History   Tobacco Use  . Smoking status: Never Smoker  . Smokeless tobacco: Never Used  Substance Use Topics  . Alcohol use: Yes    Alcohol/week: 0.0 standard drinks    Comment: Rare  . Drug use: No    Family History  Problem Relation Age of Onset  . Hyperlipidemia Mother   . Hypertension Mother   . Heart disease Mother   . Dementia Mother   . Arthritis Mother   . Neuropathy Father   . Diabetes Father   . Kidney disease Father     Allergies  Allergen Reactions  . Omnicef [Cefdinir] Rash    Medication list has been reviewed and updated.  Current Outpatient Medications on File Prior to Visit  Medication Sig Dispense Refill  . acetaminophen (TYLENOL 8 HOUR ARTHRITIS PAIN) 650 MG CR tablet Take 650 mg by mouth every 8 (eight) hours as needed for pain.    Marland Kitchen albuterol (PROVENTIL HFA;VENTOLIN HFA) 108 (90  BASE) MCG/ACT inhaler Inhale 2 puffs into the lungs every 4 (four) hours as needed for wheezing or shortness of breath (cough, shortness of breath or wheezing.). 1 Inhaler 1  . ALPRAZolam (XANAX) 1 MG tablet Take 1 tablet (1 mg total) by mouth at bedtime as needed. for sleep 90 tablet 1  . citalopram (CELEXA) 20 MG tablet Take 1 tablet (20 mg total) by mouth daily. 90 tablet 3  . gabapentin (NEURONTIN) 300 MG capsule TAKE 1 CAPSULE BY MOUTH AT  BEDTIME FOR RESTLESS LEGS.  MAY TITRATE UP TO 3 TIMES  DAILY GRADUALLY IF NEEDED 270 capsule 3  . glipiZIDE (GLUCOTROL) 10 MG tablet Take 10 mg by mouth in the morning and at bedtime.    Marland Kitchen lisinopril-hydrochlorothiazide (ZESTORETIC) 10-12.5 MG tablet  TAKE 1 TABLET BY MOUTH  DAILY 90 tablet 3  . meloxicam (MOBIC) 15 MG tablet TAKE 1 TABLET BY MOUTH  DAILY 90 tablet 3  . metFORMIN (GLUCOPHAGE) 1000 MG tablet TAKE 1 TABLET BY MOUTH  TWICE DAILY WITH MEALS 180 tablet 3  . nortriptyline (PAMELOR) 10 MG capsule Take 2 capsules (20 mg total) by mouth at bedtime. 180 capsule 3  . omeprazole (PRILOSEC) 40 MG capsule Take 1 capsule (40 mg total) by mouth daily. 90 capsule 3  . pramipexole (MIRAPEX) 0.25 MG tablet TAKE 1 TABLET BY MOUTH  EVERY EVENING AS NEEDED FOR LEG PAIN. MAY TAKE 2ND  TABLET IF NEEDED 180 tablet 3  . rizatriptan (MAXALT-MLT) 5 MG disintegrating tablet Take 1 tablet (5 mg total) by mouth as needed. May repeat in 2 hours if needed 15 tablet 12  . rosuvastatin (CRESTOR) 5 MG tablet Take 1 tablet (5 mg total) by mouth daily. 30 tablet 6  . TRULICITY A999333 0000000 SOPN INJECT SUBCUTANEOUSLY THE  CONTENTS OF 1 PEN WEEKLY AS DIRECTED 2 mL 11  . XIIDRA 5 % SOLN INT 1 GTT INTO OU BID  3   No current facility-administered medications on file prior to visit.    Review of Systems:  As per HPI- otherwise negative. No fever or chills, no chest pain or shortness of breath  Physical Examination: Vitals:   09/08/19 0846  BP: (!) 142/84  Pulse: 90  Resp: 16  Temp: 98.4 F (36.9 C)  SpO2: 98%   Vitals:   09/08/19 0846  Weight: 172 lb (78 kg)  Height: 5\' 2"  (1.575 m)   Body mass index is 31.46 kg/m. Ideal Body Weight: Weight in (lb) to have BMI = 25: 136.4  GEN: no acute distress.  Obese, looks well and her normal self HEENT: Atraumatic, Normocephalic.  Ear canals are normal, TM normal.  Some nasal cavity inflammation consistent with allergies Ears and Nose: No external deformity. CV: RRR, No M/G/R. No JVD. No thrill. No extra heart sounds. PULM: CTA B, no wheezes, crackles, rhonchi. No retractions. No resp. distress. No accessory muscle use. ABD: S, NT, ND, +BS. No rebound. No HSM. EXTR: No c/c/e PSYCH: Normally interactive.  Conversant.  Foot exam done today   Assessment and Plan: Controlled type 2 diabetes mellitus without complication, without long-term current use of insulin (HCC) - Plan: Comprehensive metabolic panel, Hemoglobin A1c, Microalbumin / creatinine urine ratio  Dyslipidemia - Plan: Lipid panel  Essential hypertension, benign - Plan: CBC, Comprehensive metabolic panel  Obesity (BMI 30.0-34.9)  Screening for thyroid disorder - Plan: TSH  Immunization due - Plan: Pneumococcal polysaccharide vaccine 23-valent greater than or equal to 2yo subcutaneous/IM  Routine follow-up visit today Encourage COVID-19  and shingles vaccines Gave pneumonia booster Labs pending as above We will await her A1c and then adjust her medications as needed Moderate medical decision making today This visit occurred during the SARS-CoV-2 public health emergency.  Safety protocols were in place, including screening questions prior to the visit, additional usage of staff PPE, and extensive cleaning of exam room while observing appropriate contact time as indicated for disinfecting solutions.    Signed Lamar Blinks, MD  Addendum 4/30, received her labs as below.  Message to patient Mild anemia is new Cologuard up-to-date A1c is somewhat improved-down from 8.8 Results for orders placed or performed in visit on 09/08/19  CBC  Result Value Ref Range   WBC 8.5 4.0 - 10.5 K/uL   RBC 4.26 3.87 - 5.11 Mil/uL   Platelets 215.0 150.0 - 400.0 K/uL   Hemoglobin 11.6 (L) 12.0 - 15.0 g/dL   HCT 35.8 (L) 36.0 - 46.0 %   MCV 84.0 78.0 - 100.0 fl   MCHC 32.4 30.0 - 36.0 g/dL   RDW 14.7 11.5 - 15.5 %  Comprehensive metabolic panel  Result Value Ref Range   Sodium 133 (L) 135 - 145 mEq/L   Potassium 4.8 3.5 - 5.1 mEq/L   Chloride 99 96 - 112 mEq/L   CO2 25 19 - 32 mEq/L   Glucose, Bld 138 (H) 70 - 99 mg/dL   BUN 23 6 - 23 mg/dL   Creatinine, Ser 1.21 (H) 0.40 - 1.20 mg/dL   Total Bilirubin 0.3 0.2 - 1.2 mg/dL    Alkaline Phosphatase 74 39 - 117 U/L   AST 23 0 - 37 U/L   ALT 29 0 - 35 U/L   Total Protein 6.8 6.0 - 8.3 g/dL   Albumin 4.5 3.5 - 5.2 g/dL   GFR 44.35 (L) >60.00 mL/min   Calcium 9.6 8.4 - 10.5 mg/dL  Hemoglobin A1c  Result Value Ref Range   Hgb A1c MFr Bld 8.4 (H) 4.6 - 6.5 %  Lipid panel  Result Value Ref Range   Cholesterol 148 0 - 200 mg/dL   Triglycerides 203.0 (H) 0.0 - 149.0 mg/dL   HDL 38.90 (L) >39.00 mg/dL   VLDL 40.6 (H) 0.0 - 40.0 mg/dL   Total CHOL/HDL Ratio 4    NonHDL 109.30   TSH  Result Value Ref Range   TSH 0.71 0.35 - 4.50 uIU/mL  Microalbumin / creatinine urine ratio  Result Value Ref Range   Microalb, Ur 2.0 (H) 0.0 - 1.9 mg/dL   Creatinine,U 123.8 mg/dL   Microalb Creat Ratio 1.6 0.0 - 30.0 mg/g  LDL cholesterol, direct  Result Value Ref Range   Direct LDL 73.0 mg/dL

## 2019-09-05 NOTE — Patient Instructions (Addendum)
It was good to see you again today, I will be in touch with your labs as soon as possible We gave you a pneumonia booster today  I encourage you to get your covid 19 vaccine asap!  The vaccine is safe and effective- it helps to protect you and your family, and the more people who are vaccinate the sooner we can get back to normal.    We can plan our next step with your labs-    Health Maintenance After Age 67 After age 70, you are at a higher risk for certain long-term diseases and infections as well as injuries from falls. Falls are a major cause of broken bones and head injuries in people who are older than age 1. Getting regular preventive care can help to keep you healthy and well. Preventive care includes getting regular testing and making lifestyle changes as recommended by your health care provider. Talk with your health care provider about:  Which screenings and tests you should have. A screening is a test that checks for a disease when you have no symptoms.  A diet and exercise plan that is right for you. What should I know about screenings and tests to prevent falls? Screening and testing are the best ways to find a health problem early. Early diagnosis and treatment give you the best chance of managing medical conditions that are common after age 83. Certain conditions and lifestyle choices may make you more likely to have a fall. Your health care provider may recommend:  Regular vision checks. Poor vision and conditions such as cataracts can make you more likely to have a fall. If you wear glasses, make sure to get your prescription updated if your vision changes.  Medicine review. Work with your health care provider to regularly review all of the medicines you are taking, including over-the-counter medicines. Ask your health care provider about any side effects that may make you more likely to have a fall. Tell your health care provider if any medicines that you take make you feel  dizzy or sleepy.  Osteoporosis screening. Osteoporosis is a condition that causes the bones to get weaker. This can make the bones weak and cause them to break more easily.  Blood pressure screening. Blood pressure changes and medicines to control blood pressure can make you feel dizzy.  Strength and balance checks. Your health care provider may recommend certain tests to check your strength and balance while standing, walking, or changing positions.  Foot health exam. Foot pain and numbness, as well as not wearing proper footwear, can make you more likely to have a fall.  Depression screening. You may be more likely to have a fall if you have a fear of falling, feel emotionally low, or feel unable to do activities that you used to do.  Alcohol use screening. Using too much alcohol can affect your balance and may make you more likely to have a fall. What actions can I take to lower my risk of falls? General instructions  Talk with your health care provider about your risks for falling. Tell your health care provider if: ? You fall. Be sure to tell your health care provider about all falls, even ones that seem minor. ? You feel dizzy, sleepy, or off-balance.  Take over-the-counter and prescription medicines only as told by your health care provider. These include any supplements.  Eat a healthy diet and maintain a healthy weight. A healthy diet includes low-fat dairy products, low-fat (lean) meats, and  fiber from whole grains, beans, and lots of fruits and vegetables. Home safety  Remove any tripping hazards, such as rugs, cords, and clutter.  Install safety equipment such as grab bars in bathrooms and safety rails on stairs.  Keep rooms and walkways well-lit. Activity   Follow a regular exercise program to stay fit. This will help you maintain your balance. Ask your health care provider what types of exercise are appropriate for you.  If you need a cane or walker, use it as  recommended by your health care provider.  Wear supportive shoes that have nonskid soles. Lifestyle  Do not drink alcohol if your health care provider tells you not to drink.  If you drink alcohol, limit how much you have: ? 0-1 drink a day for women. ? 0-2 drinks a day for men.  Be aware of how much alcohol is in your drink. In the [place] one drink equals one typical bottle of beer (12 oz), one-half glass of wine (5 oz), or one shot of hard liquor (1 oz).  Do not use any products that contain nicotine or tobacco, such as cigarettes and e-cigarettes. If you need help quitting, ask your health care provider. Summary  Having a healthy lifestyle and getting preventive care can help to protect your health and wellness after age 41.  Screening and testing are the best way to find a health problem early and help you avoid having a fall. Early diagnosis and treatment give you the best chance for managing medical conditions that are more common for people who are older than age 50.  Falls are a major cause of broken bones and head injuries in people who are older than age 53. Take precautions to prevent a fall at home.  Work with your health care provider to learn what changes you can make to improve your health and wellness and to prevent falls. This information is not intended to replace advice given to you by your health care provider. Make sure you discuss any questions you have with your health care provider. Document Revised: 08/20/2018 Document Reviewed: 03/12/2017 Elsevier Patient Education  2020 [employer].

## 2019-09-06 ENCOUNTER — Ambulatory Visit: Payer: Medicare Other | Admitting: Family Medicine

## 2019-09-08 ENCOUNTER — Ambulatory Visit (INDEPENDENT_AMBULATORY_CARE_PROVIDER_SITE_OTHER): Payer: Medicare Other | Admitting: Family Medicine

## 2019-09-08 ENCOUNTER — Other Ambulatory Visit: Payer: Self-pay

## 2019-09-08 ENCOUNTER — Encounter: Payer: Self-pay | Admitting: Family Medicine

## 2019-09-08 VITALS — BP 142/84 | HR 90 | Temp 98.4°F | Resp 16 | Ht 62.0 in | Wt 172.0 lb

## 2019-09-08 DIAGNOSIS — Z1329 Encounter for screening for other suspected endocrine disorder: Secondary | ICD-10-CM

## 2019-09-08 DIAGNOSIS — E785 Hyperlipidemia, unspecified: Secondary | ICD-10-CM

## 2019-09-08 DIAGNOSIS — E119 Type 2 diabetes mellitus without complications: Secondary | ICD-10-CM

## 2019-09-08 DIAGNOSIS — I10 Essential (primary) hypertension: Secondary | ICD-10-CM

## 2019-09-08 DIAGNOSIS — E669 Obesity, unspecified: Secondary | ICD-10-CM

## 2019-09-08 DIAGNOSIS — Z23 Encounter for immunization: Secondary | ICD-10-CM

## 2019-09-08 LAB — HEMOGLOBIN A1C: Hgb A1c MFr Bld: 8.4 % — ABNORMAL HIGH (ref 4.6–6.5)

## 2019-09-08 LAB — LDL CHOLESTEROL, DIRECT: Direct LDL: 73 mg/dL

## 2019-09-08 LAB — COMPREHENSIVE METABOLIC PANEL
ALT: 29 U/L (ref 0–35)
AST: 23 U/L (ref 0–37)
Albumin: 4.5 g/dL (ref 3.5–5.2)
Alkaline Phosphatase: 74 U/L (ref 39–117)
BUN: 23 mg/dL (ref 6–23)
CO2: 25 mEq/L (ref 19–32)
Calcium: 9.6 mg/dL (ref 8.4–10.5)
Chloride: 99 mEq/L (ref 96–112)
Creatinine, Ser: 1.21 mg/dL — ABNORMAL HIGH (ref 0.40–1.20)
GFR: 44.35 mL/min — ABNORMAL LOW (ref >60.00)
Glucose, Bld: 138 mg/dL — ABNORMAL HIGH (ref 70–99)
Potassium: 4.8 mEq/L (ref 3.5–5.1)
Sodium: 133 mEq/L — ABNORMAL LOW (ref 135–145)
Total Bilirubin: 0.3 mg/dL (ref 0.2–1.2)
Total Protein: 6.8 g/dL (ref 6.0–8.3)

## 2019-09-08 LAB — LIPID PANEL
Cholesterol: 148 mg/dL (ref 0–200)
HDL: 38.9 mg/dL — ABNORMAL LOW (ref >39.00)
NonHDL: 109.3
Total CHOL/HDL Ratio: 4
Triglycerides: 203 mg/dL — ABNORMAL HIGH (ref 0.0–149.0)
VLDL: 40.6 mg/dL — ABNORMAL HIGH (ref 0.0–40.0)

## 2019-09-08 LAB — MICROALBUMIN / CREATININE URINE RATIO
Creatinine,U: 123.8 mg/dL
Microalb Creat Ratio: 1.6 mg/g (ref 0.0–30.0)
Microalb, Ur: 2 mg/dL — ABNORMAL HIGH (ref 0.0–1.9)

## 2019-09-08 LAB — TSH: TSH: 0.71 u[IU]/mL (ref 0.35–4.50)

## 2019-09-08 NOTE — Progress Notes (Signed)
Patient given 2 boxes of trulicity A999333. Lot # [id number] F EXP 02/2020. 2 boxes dispensed to patient. Log filled out and inventory updated.

## 2019-09-09 DIAGNOSIS — Z1231 Encounter for screening mammogram for malignant neoplasm of breast: Secondary | ICD-10-CM | POA: Diagnosis not present

## 2019-09-09 LAB — CBC
HCT: 35.8 % — ABNORMAL LOW (ref 36.0–46.0)
Hemoglobin: 11.6 g/dL — ABNORMAL LOW (ref 12.0–15.0)
MCHC: 32.4 g/dL (ref 30.0–36.0)
MCV: 84 fl (ref 78.0–100.0)
Platelets: 215 10*3/uL (ref 150.0–400.0)
RBC: 4.26 Mil/uL (ref 3.87–5.11)
RDW: 14.7 % (ref 11.5–15.5)
WBC: 8.5 10*3/uL (ref 4.0–10.5)

## 2019-09-10 ENCOUNTER — Encounter: Payer: Self-pay | Admitting: Family Medicine

## 2019-09-28 ENCOUNTER — Encounter: Payer: Self-pay | Admitting: Family Medicine

## 2019-09-28 DIAGNOSIS — F418 Other specified anxiety disorders: Secondary | ICD-10-CM

## 2019-09-29 MED ORDER — ALPRAZOLAM 1 MG PO TABS: 1.0000 mg | ORAL_TABLET | Freq: Every evening | ORAL | 1 refills | Status: DC | PRN

## 2019-10-20 ENCOUNTER — Other Ambulatory Visit: Payer: Self-pay | Admitting: *Deleted

## 2019-10-20 NOTE — Patient Outreach (Signed)
[place] [facility]) Care Management  10/20/2019  CAMRIE STOCK 14-Dec-1950 [id number]   Unsuccessful outreach attempt made to patient. RN Health Coach left HIPAA compliant voicemail message along with her contact information.  Plan: RN Health Coach will call patient within the next several business days and will send patient an unsuccessful letter.    Emelia Loron RN, BSN [place] [phone] [email or web address]

## 2019-10-26 ENCOUNTER — Other Ambulatory Visit: Payer: Self-pay | Admitting: *Deleted

## 2019-10-26 NOTE — Patient Outreach (Signed)
[place] [facility]) Care Management  10/26/2019  NORALEE DUTKO 30-Jun-1950 [id number]  Unsuccessful outreach attempt made to patient. RN Health Coach left HIPAA compliant voicemail message along with her contact information.  Plan: RN Health Coach will call patient within the next several business days.   Emelia Loron RN, BSN [place] [phone] [email or web address]

## 2019-11-01 ENCOUNTER — Other Ambulatory Visit: Payer: Self-pay | Admitting: *Deleted

## 2019-11-01 NOTE — Patient Outreach (Signed)
[place] [facility]) Care Management  11/01/2019  Jodi Williams [birth year] [id number]  Unsuccessful outreach attempt made to patient. RN Health Coach left HIPAA compliant voicemail message along with her contact information.  Plan: RN Health Coach will call patient within the month of July.  Emelia Loron RN, BSN [place] [phone] [email or web address]

## 2019-11-14 ENCOUNTER — Encounter: Payer: Self-pay | Admitting: Family Medicine

## 2019-11-14 DIAGNOSIS — E119 Type 2 diabetes mellitus without complications: Secondary | ICD-10-CM

## 2019-11-16 MED ORDER — ACCU-CHEK GUIDE VI STRP: ORAL_STRIP | 12 refills | Status: DC

## 2019-11-16 NOTE — Addendum Note (Signed)
Addended by: Lamar Blinks C on: 11/16/2019 06:21 AM   Modules accepted: Orders

## 2019-11-30 ENCOUNTER — Other Ambulatory Visit: Payer: Self-pay | Admitting: Family Medicine

## 2019-12-01 ENCOUNTER — Other Ambulatory Visit: Payer: Self-pay | Admitting: *Deleted

## 2019-12-01 NOTE — Patient Outreach (Signed)
[place] [facility]) Care Management  12/01/2019  Jodi Williams [birth year] [id number]  Multiple attempts to establish contact with patient without success. No response from letter mailed to patient. Case is being closed at this time.      Plan: RN Health Coach will close case at this time.  Emelia Loron RN, BSN [place] [phone] [email or web address]

## 2019-12-16 ENCOUNTER — Encounter: Payer: Self-pay | Admitting: Family Medicine

## 2019-12-16 DIAGNOSIS — R1013 Epigastric pain: Secondary | ICD-10-CM

## 2019-12-16 DIAGNOSIS — F418 Other specified anxiety disorders: Secondary | ICD-10-CM

## 2019-12-16 MED ORDER — MELOXICAM 15 MG PO TABS: 15.0000 mg | ORAL_TABLET | Freq: Every day | ORAL | 0 refills | Status: DC

## 2019-12-16 MED ORDER — OMEPRAZOLE 40 MG PO CPDR: 40.0000 mg | DELAYED_RELEASE_CAPSULE | Freq: Every day | ORAL | 0 refills | Status: DC

## 2019-12-16 MED ORDER — CITALOPRAM HYDROBROMIDE 20 MG PO TABS: 20.0000 mg | ORAL_TABLET | Freq: Every day | ORAL | 0 refills | Status: DC

## 2020-01-15 ENCOUNTER — Encounter: Payer: Self-pay | Admitting: Family Medicine

## 2020-01-15 ENCOUNTER — Other Ambulatory Visit: Payer: Self-pay | Admitting: Family Medicine

## 2020-01-15 DIAGNOSIS — E119 Type 2 diabetes mellitus without complications: Secondary | ICD-10-CM

## 2020-01-17 MED ORDER — TRULICITY 0.75 MG/0.5ML ~~LOC~~ SOAJ: SUBCUTANEOUS | 11 refills | Status: DC

## 2020-03-05 ENCOUNTER — Other Ambulatory Visit: Payer: Self-pay | Admitting: Family Medicine

## 2020-03-05 DIAGNOSIS — F418 Other specified anxiety disorders: Secondary | ICD-10-CM

## 2020-03-06 ENCOUNTER — Encounter: Payer: Self-pay | Admitting: Family Medicine

## 2020-03-06 NOTE — Telephone Encounter (Signed)
Last written: 09/29/19 Last ov: 09/08/19 Next ov: none Contract: none UDS: none

## 2020-03-13 ENCOUNTER — Other Ambulatory Visit: Payer: Self-pay | Admitting: Family Medicine

## 2020-03-13 DIAGNOSIS — F418 Other specified anxiety disorders: Secondary | ICD-10-CM

## 2020-03-13 DIAGNOSIS — R1013 Epigastric pain: Secondary | ICD-10-CM

## 2020-04-11 DIAGNOSIS — Z96651 Presence of right artificial knee joint: Secondary | ICD-10-CM | POA: Diagnosis not present

## 2020-04-11 DIAGNOSIS — M1711 Unilateral primary osteoarthritis, right knee: Secondary | ICD-10-CM | POA: Diagnosis not present

## 2020-04-11 DIAGNOSIS — M1712 Unilateral primary osteoarthritis, left knee: Secondary | ICD-10-CM | POA: Diagnosis not present

## 2020-04-11 DIAGNOSIS — Z96652 Presence of left artificial knee joint: Secondary | ICD-10-CM | POA: Diagnosis not present

## 2020-05-04 ENCOUNTER — Other Ambulatory Visit: Payer: Self-pay | Admitting: Family Medicine

## 2020-05-12 ENCOUNTER — Other Ambulatory Visit: Payer: Self-pay | Admitting: Family Medicine

## 2020-05-12 DIAGNOSIS — G2581 Restless legs syndrome: Secondary | ICD-10-CM

## 2020-05-12 DIAGNOSIS — I10 Essential (primary) hypertension: Secondary | ICD-10-CM

## 2020-05-20 ENCOUNTER — Other Ambulatory Visit: Payer: Self-pay | Admitting: Family Medicine

## 2020-05-22 ENCOUNTER — Ambulatory Visit: Payer: Medicare Other | Admitting: Family Medicine

## 2020-05-28 ENCOUNTER — Other Ambulatory Visit: Payer: Self-pay | Admitting: Family Medicine

## 2020-05-28 DIAGNOSIS — G2581 Restless legs syndrome: Secondary | ICD-10-CM

## 2020-05-28 DIAGNOSIS — I10 Essential (primary) hypertension: Secondary | ICD-10-CM

## 2020-05-29 ENCOUNTER — Ambulatory Visit: Payer: Medicare Other | Admitting: Family Medicine

## 2020-06-04 ENCOUNTER — Encounter: Payer: Self-pay | Admitting: Family Medicine

## 2020-06-04 DIAGNOSIS — F418 Other specified anxiety disorders: Secondary | ICD-10-CM

## 2020-06-04 MED ORDER — ALPRAZOLAM 1 MG PO TABS: 1.0000 mg | ORAL_TABLET | Freq: Every evening | ORAL | 0 refills | Status: DC | PRN

## 2020-06-06 ENCOUNTER — Other Ambulatory Visit: Payer: Self-pay | Admitting: Family Medicine

## 2020-06-06 DIAGNOSIS — F418 Other specified anxiety disorders: Secondary | ICD-10-CM

## 2020-06-06 DIAGNOSIS — R1013 Epigastric pain: Secondary | ICD-10-CM

## 2020-06-10 NOTE — Progress Notes (Addendum)
[facility] [address], [address] [place], [place] [address] [phone]  Date:  06/12/2020   Name:  Jodi Williams   DOB:  02-28-1951   MRN:  [id number]  PCP:  Pearline Cables, MD    Chief Complaint: Diabetes (Discuss blood sugar, started weight watchers-has had low readings)   History of Present Illness:  Jodi Williams is a 70 y.o. very pleasant female patient who presents with the following:  Patient here today for medication recheck Last seen by myself in April-she and her husband actually live near [facility], they are in this area today for some appointments Patient with history of hypertension, diabetes, depression, asthma, hyperlipidemia, arthritis status post knee replacement  COVID-19 vaccine- not done.  Myself and her husband both encouraged her to have this done as soon as possible Flu vaccine- give today  Eye exam- she will go in March  Mammogram -per patient this is up-to-date, I do not have most recent records.  I encouraged her to have done every 1 to 2 years Cologuard now due- will order for pt   Most recent labs in April  She may rarely get sx of hypoglycemia; last week she had symptoms and her glucose was 70.  She ate and felt better She and her husband started weight watchers -she has lost a few pounds She has been frequently noticing hypoglycemia, hyperglycemia is more common for her still  Otherwise she feels well.  She and her husband got a new puppy about 3 months ago and they are really enjoying her  Wt Readings from Last 3 Encounters:  06/12/20 170 lb (77.1 kg)  09/08/19 172 lb (78 kg)  02/15/19 174 lb (78.9 kg)    Lab Results  Component Value Date   HGBA1C 8.4 (H) 09/08/2019    Patient Active Problem List   Diagnosis Date Noted  . History of partial knee replacement 11/03/2017  . Chronic pain of left knee 11/03/2017  . Chronic migraine 03/31/2017  . Preop cardiovascular exam  07/12/2015  . Dyspnea 07/12/2015  . Chest pain 07/12/2015  . Restless legs 05/24/2015  . Diabetes (HCC) 08/24/2013  . Other and unspecified hyperlipidemia 09/07/2012  . HTN (hypertension) 04/12/2012  . OA (osteoarthritis) of knee 04/12/2012  . 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 Depression with anxiety 04/12/2012  . H/O gastric ulcer 04/12/2012  . ALLERGIC RHINITIS WITH CONJUNCTIVITIS 09/08/2007  . ASTHMA 07/20/2007    Past Medical History:  Diagnosis Date  . Allergy   . Anxiety   . Arthritis   . Asthma   . Cataract   . COPD (chronic obstructive pulmonary disease) (HCC)   . Depression   . Diabetes mellitus (HCC)   . Hyperlipidemia   . Hypertension   . Ulcer     Past Surgical History:  Procedure Laterality Date  . BACK SURGERY    . BREAST SURGERY    . EYE SURGERY     cataract  . HAND SURGERY    . KNEE SURGERY      Social History   Tobacco Use  . Smoking status: Never Smoker  . Smokeless tobacco: Never Used  Substance Use Topics  . Alcohol use: Yes    Alcohol/week: 0.0 standard drinks    Comment: Rare  . Drug use: No    Family History  Problem Relation Age of Onset  . Hyperlipidemia Mother   . Hypertension Mother   . Heart  disease Mother   . Dementia Mother   . Arthritis Mother   . Neuropathy Father   . Diabetes Father   . Kidney disease Father     Allergies  Allergen Reactions  . Omnicef [Cefdinir] Rash    Medication list has been reviewed and updated.  Current Outpatient Medications on File Prior to Visit  Medication Sig Dispense Refill  . acetaminophen (TYLENOL) 650 MG CR tablet Take 650 mg by mouth every 8 (eight) hours as needed for pain.    Marland Kitchen albuterol (PROVENTIL HFA;VENTOLIN HFA) 108 (90 BASE) MCG/ACT inhaler Inhale 2 puffs into the lungs every 4 (four) hours as needed for wheezing or shortness of breath (cough, shortness of breath or wheezing.). 1 Inhaler 1  . ALPRAZolam (XANAX) 1 MG tablet Take 1 tablet (1 mg total) by mouth at  bedtime as needed. for sleep 90 tablet 0  . citalopram (CELEXA) 20 MG tablet Take 1 tablet (20 mg total) by mouth daily. 90 tablet 1  . Dulaglutide (TRULICITY) 0.75 MG/0.5ML SOPN INJECT SUBCUTANEOUSLY THE  CONTENTS OF 1 PEN WEEKLY AS DIRECTED 2 mL 11  . gabapentin (NEURONTIN) 300 MG capsule TAKE 1 CAPSULE BY MOUTH AT  BEDTIME FOR RESTLESS LEGS.  MAY TITRATE UP TO 3 TIMES  DAILY GRADUALLY IF NEEDED 270 capsule 0  . glipiZIDE (GLUCOTROL) 10 MG tablet Take 10 mg by mouth in the morning and at bedtime.    Marland Kitchen glucose blood (ACCU-CHEK GUIDE) test strip Use as instructed to test glucose up to twice a day 100 each 12  . lisinopril-hydrochlorothiazide (ZESTORETIC) 10-12.5 MG tablet Take 1 tablet by mouth daily. 90 tablet 0  . meloxicam (MOBIC) 15 MG tablet Take 1 tablet (15 mg total) by mouth daily. 90 tablet 1  . metFORMIN (GLUCOPHAGE) 1000 MG tablet TAKE 1 TABLET BY MOUTH  TWICE DAILY WITH MEALS 180 tablet 3  . nortriptyline (PAMELOR) 10 MG capsule Take 2 capsules (20 mg total) by mouth at bedtime. 180 capsule 0  . omeprazole (PRILOSEC) 40 MG capsule Take 1 capsule (40 mg total) by mouth daily. 90 capsule 3  . pramipexole (MIRAPEX) 0.25 MG tablet TAKE 1 TABLET BY MOUTH EVERY EVENING AS NEEDED FOR LEG PAIN. MAY TAKE 2ND TABLET IF NEEDED 180 tablet 0  . rizatriptan (MAXALT-MLT) 5 MG disintegrating tablet Take 1 tablet (5 mg total) by mouth as needed. May repeat in 2 hours if needed 15 tablet 12  . rosuvastatin (CRESTOR) 5 MG tablet TAKE 1 TABLET BY MOUTH  DAILY 30 tablet 11  . XIIDRA 5 % SOLN INT 1 GTT INTO OU BID  3   No current facility-administered medications on file prior to visit.    Review of Systems:  As per HPI- otherwise negative.   Physical Examination: Vitals:   06/12/20 1110  BP: 130/82  Pulse: 95  Resp: 17  SpO2: 97%   Vitals:   06/12/20 1110  Weight: 170 lb (77.1 kg)  Height: 5\' 2"  (1.575 m)   Body mass index is 31.09 kg/m. Ideal Body Weight: Weight in (lb) to have BMI =  25: 136.4  GEN: no acute distress.  Obese, otherwise looks well HEENT: Atraumatic, Normocephalic.   Bilateral TM wnl  PEERL,EOMI.   Ears and Nose: No external deformity. CV: RRR, No M/G/R. No JVD. No thrill. No extra heart sounds. PULM: CTA B, no wheezes, crackles, rhonchi. No retractions. No resp. distress. No accessory muscle use. ABD: S, NT, ND, +BS. No rebound. No HSM. EXTR: No c/c/e PSYCH:  Normally interactive. Conversant.    Assessment and Plan: Controlled type 2 diabetes mellitus without complication, without long-term current use of insulin (HCC)  Essential hypertension, benign - Plan: CBC, Comprehensive metabolic panel  Dyslipidemia - Plan: Lipid panel  Obesity (BMI 30.0-34.9)  Screening for thyroid disorder  Screening for colon cancer  Low blood glucose measurement - Plan: Hemoglobin A1c  Fatigue, unspecified type - Plan: TSH, VITAMIN D 25 Hydroxy (Vit-D Deficiency, Fractures)  Needs flu shot - Plan: Flu Vaccine QUAD High Dose(Fluad)  Special screening for malignant neoplasms, colon - Plan: Cologuard  Patient today for follow-up visit.  Flu shot given, encouraged her to get COVID-19 vaccine Ordered Cologuard Other labs are pending as above Blood pressure under good control Obese but she has lost a few pounds recently by using weight watchers Pt has noted some hypoglycemia- may need to drop her glipizide dosage depending on A1c  This visit occurred during the SARS-CoV-2 public health emergency.  Safety protocols were in place, including screening questions prior to the visit, additional usage of staff PPE, and extensive cleaning of exam room while observing appropriate contact time as indicated for disinfecting solutions.    Signed Abbe Amsterdam, MD  Received her labs 2/1, message to patient  Results for orders placed or performed in visit on 06/12/20  CBC  Result Value Ref Range   WBC 9.7 4.0 - 10.5 K/uL   RBC 4.63 3.87 - 5.11 Mil/uL   Platelets 216.0  150.0 - 400.0 K/uL   Hemoglobin 12.4 12.0 - 15.0 g/dL   HCT 16.1 09.6 - 04.5 %   MCV 81.7 78.0 - 100.0 fl   MCHC 32.9 30.0 - 36.0 g/dL   RDW 40.9 81.1 - 91.4 %  Comprehensive metabolic panel  Result Value Ref Range   Sodium 135 135 - 145 mEq/L   Potassium 5.0 3.5 - 5.1 mEq/L   Chloride 99 96 - 112 mEq/L   CO2 28 19 - 32 mEq/L   Glucose, Bld 86 70 - 99 mg/dL   BUN 19 6 - 23 mg/dL   Creatinine, Ser 7.82 0.40 - 1.20 mg/dL   Total Bilirubin 0.4 0.2 - 1.2 mg/dL   Alkaline Phosphatase 85 39 - 117 U/L   AST 33 0 - 37 U/L   ALT 53 (H) 0 - 35 U/L   Total Protein 7.3 6.0 - 8.3 g/dL   Albumin 4.7 3.5 - 5.2 g/dL   GFR 95.62 (L) >13.08 mL/min   Calcium 10.3 8.4 - 10.5 mg/dL  Hemoglobin M5H  Result Value Ref Range   Hgb A1c MFr Bld 8.4 (H) 4.6 - 6.5 %  Lipid panel  Result Value Ref Range   Cholesterol 149 0 - 200 mg/dL   Triglycerides 846.9 (H) 0.0 - 149.0 mg/dL   HDL 62.95 >28.41 mg/dL   VLDL 32.4 0.0 - 40.1 mg/dL   LDL Cholesterol 71 0 - 99 mg/dL   Total CHOL/HDL Ratio 4    NonHDL 107.70   TSH  Result Value Ref Range   TSH 0.86 0.35 - 4.50 uIU/mL  VITAMIN D 25 Hydroxy (Vit-D Deficiency, Fractures)  Result Value Ref Range   VITD 61.98 30.00 - 100.00 ng/mL

## 2020-06-12 ENCOUNTER — Ambulatory Visit: Payer: Self-pay

## 2020-06-12 ENCOUNTER — Other Ambulatory Visit: Payer: Self-pay

## 2020-06-12 ENCOUNTER — Encounter: Payer: Self-pay | Admitting: Family Medicine

## 2020-06-12 ENCOUNTER — Ambulatory Visit (INDEPENDENT_AMBULATORY_CARE_PROVIDER_SITE_OTHER): Payer: Medicare Other | Admitting: Family Medicine

## 2020-06-12 ENCOUNTER — Ambulatory Visit: Payer: Medicare Other | Admitting: Orthopaedic Surgery

## 2020-06-12 VITALS — Ht 62.0 in | Wt 170.0 lb

## 2020-06-12 VITALS — BP 130/82 | HR 95 | Resp 17 | Ht 62.0 in | Wt 170.0 lb

## 2020-06-12 DIAGNOSIS — M25562 Pain in left knee: Secondary | ICD-10-CM | POA: Diagnosis not present

## 2020-06-12 DIAGNOSIS — Z1211 Encounter for screening for malignant neoplasm of colon: Secondary | ICD-10-CM | POA: Diagnosis not present

## 2020-06-12 DIAGNOSIS — R5383 Other fatigue: Secondary | ICD-10-CM

## 2020-06-12 DIAGNOSIS — G8929 Other chronic pain: Secondary | ICD-10-CM

## 2020-06-12 DIAGNOSIS — I10 Essential (primary) hypertension: Secondary | ICD-10-CM

## 2020-06-12 DIAGNOSIS — E669 Obesity, unspecified: Secondary | ICD-10-CM

## 2020-06-12 DIAGNOSIS — E119 Type 2 diabetes mellitus without complications: Secondary | ICD-10-CM

## 2020-06-12 DIAGNOSIS — Z96659 Presence of unspecified artificial knee joint: Secondary | ICD-10-CM

## 2020-06-12 DIAGNOSIS — E785 Hyperlipidemia, unspecified: Secondary | ICD-10-CM

## 2020-06-12 DIAGNOSIS — E162 Hypoglycemia, unspecified: Secondary | ICD-10-CM | POA: Diagnosis not present

## 2020-06-12 DIAGNOSIS — Z1329 Encounter for screening for other suspected endocrine disorder: Secondary | ICD-10-CM | POA: Diagnosis not present

## 2020-06-12 DIAGNOSIS — Z23 Encounter for immunization: Secondary | ICD-10-CM

## 2020-06-12 LAB — COMPREHENSIVE METABOLIC PANEL
ALT: 53 U/L — ABNORMAL HIGH (ref 0–35)
AST: 33 U/L (ref 0–37)
Albumin: 4.7 g/dL (ref 3.5–5.2)
Alkaline Phosphatase: 85 U/L (ref 39–117)
BUN: 19 mg/dL (ref 6–23)
CO2: 28 mEq/L (ref 19–32)
Calcium: 10.3 mg/dL (ref 8.4–10.5)
Chloride: 99 mEq/L (ref 96–112)
Creatinine, Ser: 1.19 mg/dL (ref 0.40–1.20)
GFR: 47.16 mL/min — ABNORMAL LOW (ref >60.00)
Glucose, Bld: 86 mg/dL (ref 70–99)
Potassium: 5 mEq/L (ref 3.5–5.1)
Sodium: 135 mEq/L (ref 135–145)
Total Bilirubin: 0.4 mg/dL (ref 0.2–1.2)
Total Protein: 7.3 g/dL (ref 6.0–8.3)

## 2020-06-12 LAB — CBC
HCT: 37.8 % (ref 36.0–46.0)
Hemoglobin: 12.4 g/dL (ref 12.0–15.0)
MCHC: 32.9 g/dL (ref 30.0–36.0)
MCV: 81.7 fl (ref 78.0–100.0)
Platelets: 216 10*3/uL (ref 150.0–400.0)
RBC: 4.63 Mil/uL (ref 3.87–5.11)
RDW: 15.3 % (ref 11.5–15.5)
WBC: 9.7 10*3/uL (ref 4.0–10.5)

## 2020-06-12 LAB — LIPID PANEL
Cholesterol: 149 mg/dL (ref 0–200)
HDL: 41.5 mg/dL (ref >39.00)
LDL Cholesterol: 71 mg/dL (ref 0–99)
NonHDL: 107.7
Total CHOL/HDL Ratio: 4
Triglycerides: 186 mg/dL — ABNORMAL HIGH (ref 0.0–149.0)
VLDL: 37.2 mg/dL (ref 0.0–40.0)

## 2020-06-12 LAB — VITAMIN D 25 HYDROXY (VIT D DEFICIENCY, FRACTURES): VITD: 61.98 ng/mL (ref 30.00–100.00)

## 2020-06-12 LAB — HEMOGLOBIN A1C: Hgb A1c MFr Bld: 8.4 % — ABNORMAL HIGH (ref 4.6–6.5)

## 2020-06-12 LAB — TSH: TSH: 0.86 u[IU]/mL (ref 0.35–4.50)

## 2020-06-12 NOTE — Progress Notes (Signed)
The patient has some seen before.  A colleague of mine in town has done bilateral medial compartment unicompartmental knee replacements on her knees.  Her left knee does hurt her quite a bit and she feels like she gets a stabbing pain on the medial side.  This is affecting her balance and coordination.  She is an active 71 years old and spends most of the time at [facility].  She is having a hard time walking up and down stairs and walking on the beach due to her knee pain.  Examination of her left knee shows no effusion.  She does have medial joint line tenderness but good range of motion of that knee.  There is patellofemoral crepitation and the knee does feel stable.  2 views of left knee show intact medial compartment arthroplasty.  There is no complicating features but there is evidence of tricompartment arthritis in the knee.  I would like to send her for three-phase bone scan to rule out prosthetic loosening.  With the pain she is having medially I think this is reasonable.  We can certainly talk about revising to a total knee at some point but I like to see her back after the three-phase bone scan to rule out loosening of her left unicompartmental knee prosthesis.

## 2020-06-12 NOTE — Patient Instructions (Signed)
Good to see you again today- enjoy your puppy!   I will be in touch with your labs Remember mammogram every 1-2 years Flu vaccine today We will order a cologuard kit for oyu Please consider getting your shingles vaccine - shingrix- at your pharmacy  If your A1c is lower we may need to decrease your glipizide dosage to avoid low glucose

## 2020-06-13 ENCOUNTER — Encounter: Payer: Self-pay | Admitting: Family Medicine

## 2020-06-13 DIAGNOSIS — E119 Type 2 diabetes mellitus without complications: Secondary | ICD-10-CM

## 2020-06-14 MED ORDER — TRULICITY 1.5 MG/0.5ML ~~LOC~~ SOAJ: 1.5000 mg | SUBCUTANEOUS | 3 refills | Status: DC

## 2020-06-14 NOTE — Addendum Note (Signed)
Addended by: Lamar Blinks C on: 06/14/2020 11:15 AM   Modules accepted: Orders

## 2020-06-16 DIAGNOSIS — R071 Chest pain on breathing: Secondary | ICD-10-CM | POA: Diagnosis not present

## 2020-06-16 DIAGNOSIS — E119 Type 2 diabetes mellitus without complications: Secondary | ICD-10-CM | POA: Diagnosis not present

## 2020-06-16 DIAGNOSIS — R0781 Pleurodynia: Secondary | ICD-10-CM | POA: Diagnosis not present

## 2020-06-16 DIAGNOSIS — R0789 Other chest pain: Secondary | ICD-10-CM | POA: Diagnosis not present

## 2020-06-16 DIAGNOSIS — E785 Hyperlipidemia, unspecified: Secondary | ICD-10-CM | POA: Diagnosis not present

## 2020-06-16 DIAGNOSIS — Z794 Long term (current) use of insulin: Secondary | ICD-10-CM | POA: Diagnosis not present

## 2020-06-16 DIAGNOSIS — Z7984 Long term (current) use of oral hypoglycemic drugs: Secondary | ICD-10-CM | POA: Diagnosis not present

## 2020-06-16 DIAGNOSIS — I10 Essential (primary) hypertension: Secondary | ICD-10-CM | POA: Diagnosis not present

## 2020-06-28 DIAGNOSIS — Z1211 Encounter for screening for malignant neoplasm of colon: Secondary | ICD-10-CM | POA: Diagnosis not present

## 2020-07-03 ENCOUNTER — Encounter: Payer: Self-pay | Admitting: Orthopaedic Surgery

## 2020-07-03 ENCOUNTER — Other Ambulatory Visit: Payer: Self-pay

## 2020-07-03 ENCOUNTER — Encounter (HOSPITAL_COMMUNITY)
Admission: RE | Admit: 2020-07-03 | Discharge: 2020-07-03 | Disposition: A | Discharge status: Home / Self Care | Payer: Medicare Other | Source: Ambulatory Visit | Attending: Orthopaedic Surgery | Admitting: Orthopaedic Surgery

## 2020-07-03 DIAGNOSIS — M25562 Pain in left knee: Secondary | ICD-10-CM | POA: Diagnosis not present

## 2020-07-03 DIAGNOSIS — G8929 Other chronic pain: Secondary | ICD-10-CM | POA: Diagnosis not present

## 2020-07-03 DIAGNOSIS — Z96659 Presence of unspecified artificial knee joint: Secondary | ICD-10-CM

## 2020-07-03 DIAGNOSIS — M25561 Pain in right knee: Secondary | ICD-10-CM | POA: Diagnosis not present

## 2020-07-03 DIAGNOSIS — Z96653 Presence of artificial knee joint, bilateral: Secondary | ICD-10-CM | POA: Diagnosis not present

## 2020-07-03 MED ORDER — TECHNETIUM TC 99M MEDRONATE IV KIT
20.0000 | PACK | Freq: Once | INTRAVENOUS | Status: AC | PRN
  Administered 2020-07-03: 20 via INTRAVENOUS

## 2020-07-05 LAB — COLOGUARD
COLOGUARD: NEGATIVE
Cologuard: NEGATIVE

## 2020-07-05 LAB — EXTERNAL GENERIC LAB PROCEDURE: COLOGUARD: NEGATIVE

## 2020-07-06 ENCOUNTER — Encounter: Payer: Self-pay | Admitting: Family Medicine

## 2020-07-06 NOTE — Addendum Note (Signed)
Addended by: Wynonia Musty A on: 07/06/2020 10:39 AM   Modules accepted: Orders

## 2020-07-07 DIAGNOSIS — J329 Chronic sinusitis, unspecified: Secondary | ICD-10-CM | POA: Diagnosis not present

## 2020-07-07 DIAGNOSIS — J40 Bronchitis, not specified as acute or chronic: Secondary | ICD-10-CM | POA: Diagnosis not present

## 2020-07-07 DIAGNOSIS — R059 Cough, unspecified: Secondary | ICD-10-CM | POA: Diagnosis not present

## 2020-07-15 ENCOUNTER — Other Ambulatory Visit: Payer: Self-pay | Admitting: Family Medicine

## 2020-07-16 ENCOUNTER — Encounter: Payer: Self-pay | Admitting: Family Medicine

## 2020-07-17 DIAGNOSIS — H16223 Keratoconjunctivitis sicca, not specified as Sjogren's, bilateral: Secondary | ICD-10-CM | POA: Diagnosis not present

## 2020-07-17 DIAGNOSIS — E119 Type 2 diabetes mellitus without complications: Secondary | ICD-10-CM | POA: Diagnosis not present

## 2020-07-17 DIAGNOSIS — H04123 Dry eye syndrome of bilateral lacrimal glands: Secondary | ICD-10-CM | POA: Diagnosis not present

## 2020-07-17 LAB — HM DIABETES EYE EXAM

## 2020-07-17 NOTE — Telephone Encounter (Signed)
Do you have any idea what the patient is referring to? She said you were going to reschedule them? Im a little confused on how to help the pt.

## 2020-07-19 ENCOUNTER — Encounter: Payer: Self-pay | Admitting: Family Medicine

## 2020-07-19 ENCOUNTER — Other Ambulatory Visit: Payer: Self-pay | Admitting: Family Medicine

## 2020-07-19 DIAGNOSIS — E119 Type 2 diabetes mellitus without complications: Secondary | ICD-10-CM

## 2020-07-21 ENCOUNTER — Other Ambulatory Visit: Payer: Self-pay | Admitting: Family Medicine

## 2020-07-21 DIAGNOSIS — G2581 Restless legs syndrome: Secondary | ICD-10-CM

## 2020-07-21 DIAGNOSIS — I10 Essential (primary) hypertension: Secondary | ICD-10-CM

## 2020-08-01 ENCOUNTER — Encounter: Payer: Self-pay | Admitting: Orthopaedic Surgery

## 2020-08-01 ENCOUNTER — Ambulatory Visit (INDEPENDENT_AMBULATORY_CARE_PROVIDER_SITE_OTHER): Payer: Medicare Other | Admitting: Orthopaedic Surgery

## 2020-08-01 DIAGNOSIS — T84018D Broken internal joint prosthesis, other site, subsequent encounter: Secondary | ICD-10-CM | POA: Diagnosis not present

## 2020-08-01 DIAGNOSIS — Z96659 Presence of unspecified artificial knee joint: Secondary | ICD-10-CM

## 2020-08-01 DIAGNOSIS — G8929 Other chronic pain: Secondary | ICD-10-CM

## 2020-08-01 DIAGNOSIS — M25562 Pain in left knee: Secondary | ICD-10-CM

## 2020-08-01 NOTE — Progress Notes (Signed)
The patient comes in today for follow-up after having a three-phase bone scan to rule out prosthetic loosening of partial knee arthroplasties.  She had both of her knees replaced with partial unicompartmental knee replacements by one of my colleagues in town years ago.  Her left knee has bothered her for some time with recurrent effusions and pain.  The right knee hurts as well but not nearly like the left knee.  She is a diabetic.  Her last hemoglobin A1c she reports was over 8.  She has this checked again in May.  She is a thin individual.  On exam both knees have just a slight effusion with the left worse than the right.  The left is more painful along the medial compartment of the knee.  The three-phase bone scan showed significant uptake around the components of the left knee.  There is mild uptake at the right femoral component.  Based on the three-phase bone scan and clinical exam combined with x-ray findings this is concerning for loosening of the prosthesis of her left knee.  We are recommending conversion to a total knee arthroplasty with removal of the components.  I showed her a knee model with a partial knee arthroplasty and a total knee arthroplasty model as well.  I described in detail what the surgery involves including a discussion of the risk and benefits of surgery.  We will need to delay this until after blood glucose is under better control.  We will plan on surgery in late May or early June but this will depend also on her hemoglobin A1c and this needing to be below 7.8 prior to proceeding with surgery.  She agrees with this as well.  She understands the risk and benefits we have discussed these with her.  I will fill out surgery schedule feet and go ahead and get her on the schedule for late May versus early June and then can modify this depending on her blood glucose control findings.

## 2020-08-11 ENCOUNTER — Encounter: Payer: Self-pay | Admitting: Family Medicine

## 2020-08-22 ENCOUNTER — Other Ambulatory Visit: Payer: Self-pay

## 2020-08-22 ENCOUNTER — Other Ambulatory Visit: Payer: Self-pay | Admitting: Family Medicine

## 2020-08-22 DIAGNOSIS — E119 Type 2 diabetes mellitus without complications: Secondary | ICD-10-CM | POA: Diagnosis not present

## 2020-08-22 DIAGNOSIS — F418 Other specified anxiety disorders: Secondary | ICD-10-CM

## 2020-08-23 ENCOUNTER — Encounter: Payer: Self-pay | Admitting: Family Medicine

## 2020-08-23 LAB — HEMOGLOBIN A1C
Est. average glucose Bld gHb Est-mCnc: 177 mg/dL
Hgb A1c MFr Bld: 7.8 % — ABNORMAL HIGH (ref 4.8–5.6)

## 2020-08-24 ENCOUNTER — Other Ambulatory Visit: Payer: Self-pay | Admitting: Physician Assistant

## 2020-08-29 ENCOUNTER — Other Ambulatory Visit: Payer: Self-pay

## 2020-08-29 ENCOUNTER — Telehealth: Payer: Self-pay

## 2020-08-29 NOTE — Telephone Encounter (Signed)
Patient is asking if she can do post op therapy in [place], [place].  Her TKA revision surgery is planned for 09/21/20.

## 2020-08-30 NOTE — Progress Notes (Signed)
Subjective:   Jodi Williams is a 70 y.o. female who presents for Medicare Annual (Subsequent) preventive examination.  I connected with Raliyah today by telephone and verified that I am speaking with the correct person using two identifiers. Location patient: home Location provider: work Persons participating in the virtual visit: patient, Marine scientist.    I discussed the limitations, risks, security and privacy concerns of performing an evaluation and management service by telephone and the availability of in person appointments. I also discussed with the patient that there may be a patient responsible charge related to this service. The patient expressed understanding and verbally consented to this telephonic visit.    Interactive audio and video telecommunications were attempted between this provider and patient, however failed, due to patient having technical difficulties OR patient did not have access to video capability.  We continued and completed visit with audio only.  Some vital signs may be absent or patient reported.   Time Spent with patient on telephone encounter: 25 minutes   Review of Systems     Cardiac Risk Factors include: advanced age (>6men, >55 women);obesity (BMI >30kg/m2);sedentary lifestyle;diabetes mellitus;hypertension     Objective:    Today's Vitals   08/31/20 1320  Weight: 164 lb (74.4 kg)  Height: 5\' 2"  (1.575 m)  PainSc: 5    Body mass index is 30 kg/m.  Advanced Directives 08/31/2020 08/30/2019 09/01/2014  Does Patient Have a Medical Advance Directive? Yes;No No No  Does patient want to make changes to medical advance directive? Yes (MAU/Ambulatory/Procedural Areas - Information given) - -  Would patient like information on creating a medical advance directive? - No - Patient declined -    Current Medications (verified) Outpatient Encounter Medications as of 08/31/2020  Medication Sig  . acetaminophen (TYLENOL) 650 MG CR tablet Take 650 mg by mouth  every 8 (eight) hours as needed for pain.  Marland Kitchen albuterol (PROVENTIL HFA;VENTOLIN HFA) 108 (90 BASE) MCG/ACT inhaler Inhale 2 puffs into the lungs every 4 (four) hours as needed for wheezing or shortness of breath (cough, shortness of breath or wheezing.).  Marland Kitchen ALPRAZolam (XANAX) 1 MG tablet TAKE 1 TABLET BY MOUTH AT  BEDTIME AS NEEDED. FOR  SLEEP  . citalopram (CELEXA) 20 MG tablet Take 1 tablet (20 mg total) by mouth daily.  . Dulaglutide (TRULICITY) 1.5 ZO/1.0RU SOPN Inject 1.5 mg into the skin once a week.  . gabapentin (NEURONTIN) 300 MG capsule TAKE 1 CAPSULE BY MOUTH AT  BEDTIME FOR RESTLESS LEGS.  MAY TITRATE UP TO 3 TIMES  DAILY GRADUALLY IF NEEDED  . glucose blood (ACCU-CHEK GUIDE) test strip Use as instructed to test glucose up to twice a day  . lisinopril-hydrochlorothiazide (ZESTORETIC) 10-12.5 MG tablet TAKE 1 TABLET BY MOUTH  DAILY  . meloxicam (MOBIC) 15 MG tablet Take 1 tablet (15 mg total) by mouth daily.  . metFORMIN (GLUCOPHAGE) 1000 MG tablet TAKE 1 TABLET BY MOUTH  TWICE DAILY WITH MEALS  . nortriptyline (PAMELOR) 10 MG capsule TAKE 2 CAPSULES BY MOUTH AT BEDTIME  . omeprazole (PRILOSEC) 40 MG capsule Take 1 capsule (40 mg total) by mouth daily.  . pramipexole (MIRAPEX) 0.25 MG tablet TAKE 1 TABLET BY MOUTH IN  THE EVENING AS NEEDED FOR  LEG PAIN. MAY TAKE 2ND  TABLET IF NEEDED  . rizatriptan (MAXALT-MLT) 5 MG disintegrating tablet Take 1 tablet (5 mg total) by mouth as needed. May repeat in 2 hours if needed  . rosuvastatin (CRESTOR) 5 MG tablet TAKE 1  TABLET BY MOUTH  DAILY  . XIIDRA 5 % SOLN INT 1 GTT INTO OU BID  . glipiZIDE (GLUCOTROL) 10 MG tablet Take 10 mg by mouth in the morning and at bedtime. (Patient not taking: Reported on 08/31/2020)   No facility-administered encounter medications on file as of 08/31/2020.    Allergies (verified) Omnicef [cefdinir]   History: Past Medical History:  Diagnosis Date  . Allergy   . Anxiety   . Arthritis   . Asthma   . Cataract    . COPD (chronic obstructive pulmonary disease) ([place])   . Depression   . Diabetes mellitus ([place])   . Hyperlipidemia   . Hypertension   . Ulcer    Past Surgical History:  Procedure Laterality Date  . BACK SURGERY    . BREAST SURGERY    . EYE SURGERY     cataract  . HAND SURGERY    . KNEE SURGERY     Family History  Problem Relation Age of Onset  . Hyperlipidemia Mother   . Hypertension Mother   . Heart disease Mother   . Dementia Mother   . Arthritis Mother   . Neuropathy Father   . Diabetes Father   . Kidney disease Father    Social History   Socioeconomic History  . Marital status: Married    Spouse name: Not on file  . Number of children: 1  . Years of education: Not on file  . Highest education level: Not on file  Occupational History  . Occupation: Electrical engineer  Tobacco Use  . Smoking status: Never Smoker  . Smokeless tobacco: Never Used  Substance and Sexual Activity  . Alcohol use: Not Currently    Alcohol/week: 0.0 standard drinks    Comment: Rare  . Drug use: No  . Sexual activity: Yes    Birth control/protection: None  Other Topics Concern  . Not on file  Social History Narrative  . Not on file   Social Determinants of Health   Financial Resource Strain: Low Risk   . Difficulty of Paying Living Expenses: Not hard at all  Food Insecurity: No Food Insecurity  . Worried About Charity fundraiser in the Last Year: Never true  . Ran Out of Food in the Last Year: Never true  Transportation Needs: No Transportation Needs  . Lack of Transportation (Medical): No  . Lack of Transportation (Non-Medical): No  Physical Activity: Inactive  . Days of Exercise per Week: 0 days  . Minutes of Exercise per Session: 0 min  Stress: No Stress Concern Present  . Feeling of Stress : Not at all  Social Connections: Moderately Isolated  . Frequency of Communication with Friends and Family: More than three times a week  . Frequency of Social Gatherings with  Friends and Family: More than three times a week  . Attends Religious Services: Never  . Active Member of Clubs or Organizations: No  . Attends Archivist Meetings: Never  . Marital Status: Married    Tobacco Counseling Counseling given: Not Answered   Clinical Intake:  Pre-visit preparation completed: Yes  Pain : 0-10 Pain Score: 5  Pain Type: Acute pain Pain Location: Knee (recent knee surgery) Pain Orientation: Left Pain Onset: 1 to 4 weeks ago Pain Frequency: Constant     Nutritional Status: BMI > 30  Obese Nutritional Risks: None Diabetes: Yes CBG done?: No Did pt. bring in CBG monitor from home?: No (phone visit)  How often do you  need to have someone help you when you read instructions, pamphlets, or other written materials from your doctor or pharmacy?: 1 - Never  Diabetes:  Is the patient diabetic?  Yes  If diabetic, was a CBG obtained today?  No  Did the patient bring in their glucometer from home?  No  How often do you monitor your CBG's? occasionally.   Financial Strains and Diabetes Management:  Are you having any financial strains with the device, your supplies or your medication? No .  Does the patient want to be seen by Chronic Care Management for management of their diabetes?  No  Would the patient like to be referred to a Nutritionist or for Diabetic Management?  No   Diabetic Exams:  Diabetic Eye Exam: Completed 07/17/2020.   Diabetic Foot Exam: Completed 09/08/2019.   Interpreter Needed?: No  Information entered by :: Caroleen Hamman LPN   Activities of Daily Living In your present state of health, do you have any difficulty performing the following activities: 08/31/2020  Hearing? N  Vision? N  Difficulty concentrating or making decisions? N  Walking or climbing stairs? N  Dressing or bathing? N  Doing errands, shopping? N  Preparing Food and eating ? N  Using the Toilet? N  In the past six months, have you accidently leaked  urine? N  Do you have problems with loss of bowel control? N  Managing your Medications? N  Managing your Finances? N  Housekeeping or managing your Housekeeping? N  Some recent data might be hidden    Patient Care Team: Copland, Gay Filler, MD as PCP - General (Family Medicine) Lomax, Marny Lowenstein, MD (Inactive) as Attending Physician (Obstetrics and Gynecology)  Indicate any recent Medical Services you may have received from other than [facility] providers in the past year (date may be approximate).     Assessment:   This is a routine wellness examination for Sherley.  Hearing/Vision screen  Hearing Screening   125Hz  250Hz  500Hz  1000Hz  2000Hz  3000Hz  4000Hz  6000Hz  8000Hz   Right ear:           Left ear:           Comments: No issues  Vision Screening Comments: Glasses for driving Last eye [id number]. Bing Plume  Dietary issues and exercise activities discussed: Current Exercise Habits: The patient does not participate in regular exercise at present, Exercise limited by: orthopedic condition(s)  Goals    . Increase physical activity      Depression Screen PHQ 2/9 Scores 08/31/2020 08/30/2019 04/07/2017 04/01/2016 10/18/2015 05/24/2015 05/16/2015  PHQ - 2 Score 0 0 0 0 0 0 0    Fall Risk Fall Risk  08/31/2020 08/30/2019 04/07/2017 04/01/2016 10/18/2015  Falls in the past year? 1 0 No No Yes  Number falls in past yr: 1 0 - - 2 or more  Comment - - - - Fallls occured before having knee surgery  Injury with Fall? 0 0 - - No  Comment - - - - -  Follow up Falls prevention discussed Education provided;Falls prevention discussed - - -    FALL RISK PREVENTION PERTAINING TO THE HOME:  Any stairs in or around the home? Yes  If so, are there any without handrails? No  Home free of loose throw rugs in walkways, pet beds, electrical cords, etc? Yes  Adequate lighting in your home to reduce risk of falls? Yes   ASSISTIVE DEVICES UTILIZED TO PREVENT FALLS:  Life alert? No  Use of a cane, walker or  w/c? No  Grab bars in the bathroom? Yes  Shower chair or bench in shower? No  Elevated toilet seat or a handicapped toilet? No   TIMED UP AND GO:  Was the test performed? No . Phone visit   Cognitive Function:Normal cognitive status assessed by  this Nurse Health Advisor. No abnormalities found.          Immunizations Immunization History  Administered Date(s) Administered  . Fluad Quad(high Dose 65+) 06/12/2020  . Influenza, Seasonal, Injecte, Preservative Fre 04/12/2012  . Influenza,inj,Quad PF,6+ Mos 04/04/2013, 01/24/2014, 01/18/2015, 03/28/2016, 04/07/2017, 03/18/2018  . Influenza,inj,quad, With Preservative 03/13/2017  . Pneumococcal Conjugate-13 12/03/2017  . Pneumococcal Polysaccharide-23 05/16/2014, 09/08/2019  . Td 12/03/2017, 12/03/2017  . Tdap 10/14/2007  . Zoster 10/18/2015    TDAP status: Up to date  Flu Vaccine status: Up to date  Pneumococcal vaccine status: Up to date  Covid-19 vaccine status: Declined, Education has been provided regarding the importance of this vaccine but patient still declined. Advised may receive this vaccine at local pharmacy or Health Dept.or vaccine clinic. Aware to provide a copy of the vaccination record if obtained from local pharmacy or Health Dept. Verbalized acceptance and understanding.  Qualifies for Shingles Vaccine? Yes   Zostavax completed Yes   Shingrix Completed?: No.    Education has been provided regarding the importance of this vaccine. Patient has been advised to call insurance company to determine out of pocket expense if they have not yet received this vaccine. Advised may also receive vaccine at local pharmacy or Health Dept. Verbalized acceptance and understanding.  Screening Tests Health Maintenance  Topic Date Due  . COVID-19 Vaccine (1) Never done  . MAMMOGRAM  03/13/2020  . FOOT EXAM  09/07/2020  . INFLUENZA VACCINE  12/11/2020  . HEMOGLOBIN A1C  02/21/2021  . OPHTHALMOLOGY EXAM  07/17/2021  . Fecal  DNA (Cologuard)  06/29/2023  . TETANUS/TDAP  12/04/2027  . DEXA SCAN  Completed  . Hepatitis C Screening  Completed  . PNA vac Low Risk Adult  Completed  . HPV VACCINES  Aged Out    Health Maintenance  Health Maintenance Due  Topic Date Due  . COVID-19 Vaccine (1) Never done  . MAMMOGRAM  03/13/2020    Colorectal cancer screening: Type of screening: Cologuard. Completed 06/28/2020. Repeat every 3 years  Mammogram status: Due-Patient states GYN will order.  Bone Density status: Due- Declined today  Lung Cancer Screening: (Low Dose CT Chest recommended if Age 58-80 years, 30 pack-year currently smoking OR have quit w/in 15years.) does not qualify.    Additional Screening:  Hepatitis C Screening:Completed 01/18/2015  Vision Screening: Recommended annual ophthalmology exams for early detection of glaucoma and other disorders of the eye. Is the patient up to date with their annual eye exam?  Yes  Who is the provider or what is the name of the office in which the patient attends annual eye exams? Dr. Bing Plume   Dental Screening: Recommended annual dental exams for proper oral hygiene  Community Resource Referral / Chronic Care Management: CRR required this visit?  No   CCM required this visit?  No      Plan:     I have personally reviewed and noted the following in the patient's chart:   . Medical and social history . Use of alcohol, tobacco or illicit drugs  . Current medications and supplements . Functional ability and status . Nutritional status . Physical activity . Advanced directives . List of other physicians . Hospitalizations,  surgeries, and ER visits in previous 12 months . Vitals . Screenings to include cognitive, depression, and falls . Referrals and appointments  In addition, I have reviewed and discussed with patient certain preventive protocols, quality metrics, and best practice recommendations. A written personalized care plan for preventive services  as well as general preventive health recommendations were provided to patient.   Due to this being a telephonic visit, the after visit summary with patients personalized plan was offered to patient via mail or my-chart. Patient would like to access on my-chart.   Marta Antu, LPN   1/66/0630  Nurse Health Advisor  Nurse Notes: None

## 2020-08-30 NOTE — Telephone Encounter (Signed)
Patient aware to find a HHPT near the area she is asking about and we will get everything to them

## 2020-08-31 ENCOUNTER — Ambulatory Visit (INDEPENDENT_AMBULATORY_CARE_PROVIDER_SITE_OTHER): Payer: Medicare Other

## 2020-08-31 VITALS — Ht 62.0 in | Wt 164.0 lb

## 2020-08-31 DIAGNOSIS — Z0000 Encounter for general adult medical examination without abnormal findings: Secondary | ICD-10-CM | POA: Diagnosis not present

## 2020-08-31 NOTE — Patient Instructions (Signed)
Jodi Williams , Thank you for taking time to complete your Medicare Wellness Visit. I appreciate your ongoing commitment to your health goals. Please review the following plan we discussed and let me know if I can assist you in the future.   Screening recommendations/referrals: Colonoscopy: Completed Cologuard 06/28/2020-Due 06/29/2023 Mammogram: Due-Per our conversation, GYN will order. Bone Density: Due-Declined today. Please call the office when you are ready to schedule. Recommended yearly ophthalmology/optometry visit for glaucoma screening and checkup Recommended yearly dental visit for hygiene and checkup  Vaccinations: Influenza vaccine: Up to date Pneumococcal vaccine: Completed vaccines Tdap vaccine: Up to date-Due-12/04/2027 Shingles vaccine: Discuss with pharmacy   Covid-19:Declined  Advanced directives: Information mailed today  Conditions/risks identified: See problem list  Next appointment: Follow up in one year for your annual wellness visit    Preventive Care 68 Years and Older, Female Preventive care refers to lifestyle choices and visits with your health care provider that can promote health and wellness. What does preventive care include?  A yearly physical exam. This is also called an annual well check.  Dental exams once or twice a year.  Routine eye exams. Ask your health care provider how often you should have your eyes checked.  Personal lifestyle choices, including:  Daily care of your teeth and gums.  Regular physical activity.  Eating a healthy diet.  Avoiding tobacco and drug use.  Limiting alcohol use.  Practicing safe sex.  Taking low-dose aspirin every day.  Taking vitamin and mineral supplements as recommended by your health care provider. What happens during an annual well check? The services and screenings done by your health care provider during your annual well check will depend on your age, overall health, lifestyle risk factors, and  family history of disease. Counseling  Your health care provider may ask you questions about your:  Alcohol use.  Tobacco use.  Drug use.  Emotional well-being.  Home and relationship well-being.  Sexual activity.  Eating habits.  History of falls.  Memory and ability to understand (cognition).  Work and work Statistician.  Reproductive health. Screening  You may have the following tests or measurements:  Height, weight, and BMI.  Blood pressure.  Lipid and cholesterol levels. These may be checked every 5 years, or more frequently if you are over 41 years old.  Skin check.  Lung cancer screening. You may have this screening every year starting at age 68 if you have a 30-pack-year history of smoking and currently smoke or have quit within the past 15 years.  Fecal occult blood test (FOBT) of the stool. You may have this test every year starting at age 68.  Flexible sigmoidoscopy or colonoscopy. You may have a sigmoidoscopy every 5 years or a colonoscopy every 10 years starting at age 68.  Hepatitis C blood test.  Hepatitis B blood test.  Sexually transmitted disease (STD) testing.  Diabetes screening. This is done by checking your blood sugar (glucose) after you have not eaten for a while (fasting). You may have this done every 1-3 years.  Bone density scan. This is done to screen for osteoporosis. You may have this done starting at age 68.  Mammogram. This may be done every 1-2 years. Talk to your health care provider about how often you should have regular mammograms. Talk with your health care provider about your test results, treatment options, and if necessary, the need for more tests. Vaccines  Your health care provider may recommend certain vaccines, such as:  Influenza vaccine.  This is recommended every year.  Tetanus, diphtheria, and acellular pertussis (Tdap, Td) vaccine. You may need a Td booster every 10 years.  Zoster vaccine. You may need this  after age 68.  Pneumococcal 13-valent conjugate (PCV13) vaccine. One dose is recommended after age 68.  Pneumococcal polysaccharide (PPSV23) vaccine. One dose is recommended after age 68. Talk to your health care provider about which screenings and vaccines you need and how often you need them. This information is not intended to replace advice given to you by your health care provider. Make sure you discuss any questions you have with your health care provider. Document Released: 05/26/2015 Document Revised: 01/17/2016 Document Reviewed: 02/28/2015 Elsevier Interactive Patient Education  2017 [place] Prevention in the Home Falls can cause injuries. They can happen to people of all ages. There are many things you can do to make your home safe and to help prevent falls. What can I do on the outside of my home?  Regularly fix the edges of walkways and driveways and fix any cracks.  Remove anything that might make you trip as you walk through a door, such as a raised step or threshold.  Trim any bushes or trees on the path to your home.  Use bright outdoor lighting.  Clear any walking paths of anything that might make someone trip, such as rocks or tools.  Regularly check to see if handrails are loose or broken. Make sure that both sides of any steps have handrails.  Any raised decks and porches should have guardrails on the edges.  Have any leaves, snow, or ice cleared regularly.  Use sand or salt on walking paths during winter.  Clean up any spills in your garage right away. This includes oil or grease spills. What can I do in the bathroom?  Use night lights.  Install grab bars by the toilet and in the tub and shower. Do not use towel bars as grab bars.  Use non-skid mats or decals in the tub or shower.  If you need to sit down in the shower, use a plastic, non-slip stool.  Keep the floor dry. Clean up any water that spills on the floor as soon as it  happens.  Remove soap buildup in the tub or shower regularly.  Attach bath mats securely with double-sided non-slip rug tape.  Do not have throw rugs and other things on the floor that can make you trip. What can I do in the bedroom?  Use night lights.  Make sure that you have a light by your bed that is easy to reach.  Do not use any sheets or blankets that are too big for your bed. They should not hang down onto the floor.  Have a firm chair that has side arms. You can use this for support while you get dressed.  Do not have throw rugs and other things on the floor that can make you trip. What can I do in the kitchen?  Clean up any spills right away.  Avoid walking on wet floors.  Keep items that you use a lot in easy-to-reach places.  If you need to reach something above you, use a strong step stool that has a grab bar.  Keep electrical cords out of the way.  Do not use floor polish or wax that makes floors slippery. If you must use wax, use non-skid floor wax.  Do not have throw rugs and other things on the floor that can make  you trip. What can I do with my stairs?  Do not leave any items on the stairs.  Make sure that there are handrails on both sides of the stairs and use them. Fix handrails that are broken or loose. Make sure that handrails are as long as the stairways.  Check any carpeting to make sure that it is firmly attached to the stairs. Fix any carpet that is loose or worn.  Avoid having throw rugs at the top or bottom of the stairs. If you do have throw rugs, attach them to the floor with carpet tape.  Make sure that you have a light switch at the top of the stairs and the bottom of the stairs. If you do not have them, ask someone to add them for you. What else can I do to help prevent falls?  Wear shoes that:  Do not have high heels.  Have rubber bottoms.  Are comfortable and fit you well.  Are closed at the toe. Do not wear sandals.  If you  use a stepladder:  Make sure that it is fully opened. Do not climb a closed stepladder.  Make sure that both sides of the stepladder are locked into place.  Ask someone to hold it for you, if possible.  Clearly mark and make sure that you can see:  Any grab bars or handrails.  First and last steps.  Where the edge of each step is.  Use tools that help you move around (mobility aids) if they are needed. These include:  Canes.  Walkers.  Scooters.  Crutches.  Turn on the lights when you go into a dark area. Replace any light bulbs as soon as they burn out.  Set up your furniture so you have a clear path. Avoid moving your furniture around.  If any of your floors are uneven, fix them.  If there are any pets around you, be aware of where they are.  Review your medicines with your doctor. Some medicines can make you feel dizzy. This can increase your chance of falling. Ask your doctor what other things that you can do to help prevent falls. This information is not intended to replace advice given to you by your health care provider. Make sure you discuss any questions you have with your health care provider. Document Released: 02/23/2009 Document Revised: 10/05/2015 Document Reviewed: 06/03/2014 Elsevier Interactive Patient Education  2017 [employer].

## 2020-09-07 ENCOUNTER — Other Ambulatory Visit: Payer: Self-pay | Admitting: Family Medicine

## 2020-09-18 NOTE — Progress Notes (Signed)
Surgical Instructions    Your procedure is scheduled on Thursday, May 12th.  Report to [facility] Main Entrance "A" at 6:00 A.M., then check in with the Admitting office.  Call this number if you have problems the morning of surgery:  [phone]   If you have any questions prior to your surgery date call [phone]: Open Monday-Friday 8am-4pm    Remember:  Do not eat after midnight the night before your surgery  You may drink clear liquids until 5:00 AM the morning of your surgery.   Clear liquids allowed are: Water, Non-Citrus Juices (without pulp), Carbonated Beverages, Clear Tea, Black Coffee Only, and Gatorade  You have been given a pre-surgery water to drink the morning of surgery.  Please finish the water by 5:00 AM, the morning of surgery.  Nothing else to drink after you finish the water.     Take these medicines the morning of surgery with A SIP OF WATER   Tylenol - if needed  Albuterol inhaler - if needed  Zyrtec - if needed  Citalopram (Celexa)  Gabapentin (Neurontin) - if needed  Eye drops - if needed  Omeprazole (Prilosec)  Follow your surgeon's instructions on when to stop Aspirin.  If no instructions were given by your surgeon then you will need to call the office to get those instructions.    As of today, STOP taking any Aleve, Naproxen, Ibuprofen, Motrin, Advil, Goody's, BC's, all herbal medications, fish oil, and all vitamins.   WHAT DO I DO ABOUT MY DIABETES MEDICATION?   Marland Kitchen Do not take oral diabetes medicines (pills) the morning of surgery. - Metformin  . The day of surgery, do not take other diabetes injectables, including Byetta (exenatide), Bydureon (exenatide ER), Victoza (liraglutide), or Trulicity (dulaglutide).   HOW TO MANAGE YOUR DIABETES BEFORE AND AFTER SURGERY  Why is it important to control my blood sugar before and after surgery? . Improving blood sugar levels before and after surgery helps healing and can limit problems. . A way of  improving blood sugar control is eating a healthy diet by: o  Eating less sugar and carbohydrates o  Increasing activity/exercise o  Talking with your doctor about reaching your blood sugar goals . High blood sugars (greater than 180 mg/dL) can raise your risk of infections and slow your recovery, so you will need to focus on controlling your diabetes during the weeks before surgery. . Make sure that the doctor who takes care of your diabetes knows about your planned surgery including the date and location.  How do I manage my blood sugar before surgery? . Check your blood sugar at least 4 times a day, starting 2 days before surgery, to make sure that the level is not too high or low. . Check your blood sugar the morning of your surgery when you wake up and every 2 hours until you get to the Short Stay unit. o If your blood sugar is less than 70 mg/dL, you will need to treat for low blood sugar: - Do not take insulin. - Treat a low blood sugar (less than 70 mg/dL) with  cup of clear juice (cranberry or apple), 4 glucose tablets, OR glucose gel. - Recheck blood sugar in 15 minutes after treatment (to make sure it is greater than 70 mg/dL). If your blood sugar is not greater than 70 mg/dL on recheck, call [phone] for further instructions. . Report your blood sugar to the short stay nurse when you get to Short Stay.  Marland Kitchen  If you are admitted to the hospital after surgery: o Your blood sugar will be checked by the staff and you will probably be given insulin after surgery (instead of oral diabetes medicines) to make sure you have good blood sugar levels. o The goal for blood sugar control after surgery is 80-180 mg/dL.                        DAY OF SURGERY:            Do not wear jewelry, make up, or nail polish            Do not wear lotions, powders, perfumes, or deodorant.            Do not shave 48 hours prior to surgery.              Do not bring valuables to the hospital.             [facility] is not responsible for any belongings or valuables.  Do NOT Smoke (Tobacco/Vaping) or drink Alcohol 24 hours prior to your procedure If you use a CPAP at night, you may bring all equipment for your overnight stay.   Contacts, glasses, dentures or bridgework may not be worn into surgery, please bring cases for these belongings   For patients admitted to the hospital, discharge time will be determined by your treatment team.   Patients discharged the day of surgery will not be allowed to drive home, and someone needs to stay with them for 24 hours.    Special instructions:   [place]- Preparing For Surgery  Before surgery, you can play an important role. Because skin is not sterile, your skin needs to be as free of germs as possible. You can reduce the number of germs on your skin by washing with CHG (chlorahexidine gluconate) Soap before surgery.  CHG is an antiseptic cleaner which kills germs and bonds with the skin to continue killing germs even after washing.    Oral Hygiene is also important to reduce your risk of infection.  Remember - BRUSH YOUR TEETH THE MORNING OF SURGERY WITH YOUR REGULAR TOOTHPASTE  Please do not use if you have an allergy to CHG or antibacterial soaps. If your skin becomes reddened/irritated stop using the CHG.  Do not shave (including legs and underarms) for at least 48 hours prior to first CHG shower. It is OK to shave your face.  Please follow these instructions carefully.   1. Shower the NIGHT BEFORE SURGERY and the MORNING OF SURGERY  2. If you chose to wash your hair, wash your hair first as usual with your normal shampoo.  3. After you shampoo, rinse your hair and body thoroughly to remove the shampoo.  4. Wash Face and genitals (private parts) with your normal soap.   5.  Shower the NIGHT BEFORE SURGERY and the MORNING OF SURGERY with CHG Soap.   6. Use CHG Soap as you would any other liquid soap. You can apply CHG directly to the  skin and wash gently with a scrungie or a clean washcloth.   7. Apply the CHG Soap to your body ONLY FROM THE NECK DOWN.  Do not use on open wounds or open sores. Avoid contact with your eyes, ears, mouth and genitals (private parts). Wash Face and genitals (private parts)  with your normal soap.   8. Wash thoroughly, paying special attention to the area where your surgery  will be performed.  9. Thoroughly rinse your body with warm water from the neck down.  10. DO NOT shower/wash with your normal soap after using and rinsing off the CHG Soap.  11. Pat yourself dry with a CLEAN TOWEL.  12. Wear CLEAN PAJAMAS to bed the night before surgery  13. Place CLEAN SHEETS on your bed the night before your surgery  14. DO NOT SLEEP WITH PETS.   Day of Surgery: Take a shower with CHG soap. Wear Clean/Comfortable clothing the morning of surgery Do not apply any deodorants/lotions.   Remember to brush your teeth WITH YOUR REGULAR TOOTHPASTE.   Please read over the following fact sheets that you were given.

## 2020-09-19 ENCOUNTER — Encounter (HOSPITAL_COMMUNITY)
Admission: RE | Admit: 2020-09-19 | Discharge: 2020-09-19 | Disposition: A | Discharge status: Home / Self Care | Payer: Medicare Other | Source: Ambulatory Visit | Attending: Orthopaedic Surgery | Admitting: Orthopaedic Surgery

## 2020-09-19 ENCOUNTER — Other Ambulatory Visit: Payer: Self-pay

## 2020-09-19 ENCOUNTER — Encounter (HOSPITAL_COMMUNITY): Payer: Self-pay

## 2020-09-19 DIAGNOSIS — E7849 Other hyperlipidemia: Secondary | ICD-10-CM | POA: Diagnosis not present

## 2020-09-19 DIAGNOSIS — J449 Chronic obstructive pulmonary disease, unspecified: Secondary | ICD-10-CM | POA: Diagnosis not present

## 2020-09-19 DIAGNOSIS — Z83438 Family history of other disorder of lipoprotein metabolism and other lipidemia: Secondary | ICD-10-CM | POA: Diagnosis not present

## 2020-09-19 DIAGNOSIS — M2540 Effusion, unspecified joint: Secondary | ICD-10-CM | POA: Diagnosis not present

## 2020-09-19 DIAGNOSIS — D62 Acute posthemorrhagic anemia: Secondary | ICD-10-CM | POA: Diagnosis not present

## 2020-09-19 DIAGNOSIS — M25562 Pain in left knee: Secondary | ICD-10-CM | POA: Diagnosis present

## 2020-09-19 DIAGNOSIS — Z8249 Family history of ischemic heart disease and other diseases of the circulatory system: Secondary | ICD-10-CM | POA: Diagnosis not present

## 2020-09-19 DIAGNOSIS — Z96652 Presence of left artificial knee joint: Secondary | ICD-10-CM | POA: Diagnosis not present

## 2020-09-19 DIAGNOSIS — J9811 Atelectasis: Secondary | ICD-10-CM | POA: Diagnosis not present

## 2020-09-19 DIAGNOSIS — Z841 Family history of disorders of kidney and ureter: Secondary | ICD-10-CM | POA: Diagnosis not present

## 2020-09-19 DIAGNOSIS — Z833 Family history of diabetes mellitus: Secondary | ICD-10-CM | POA: Diagnosis not present

## 2020-09-19 DIAGNOSIS — F419 Anxiety disorder, unspecified: Secondary | ICD-10-CM | POA: Diagnosis present

## 2020-09-19 DIAGNOSIS — G2581 Restless legs syndrome: Secondary | ICD-10-CM | POA: Diagnosis not present

## 2020-09-19 DIAGNOSIS — Y792 Prosthetic and other implants, materials and accessory orthopedic devices associated with adverse incidents: Secondary | ICD-10-CM | POA: Diagnosis present

## 2020-09-19 DIAGNOSIS — Z8261 Family history of arthritis: Secondary | ICD-10-CM | POA: Diagnosis not present

## 2020-09-19 DIAGNOSIS — Z20822 Contact with and (suspected) exposure to covid-19: Secondary | ICD-10-CM | POA: Insufficient documentation

## 2020-09-19 DIAGNOSIS — I10 Essential (primary) hypertension: Secondary | ICD-10-CM | POA: Diagnosis not present

## 2020-09-19 DIAGNOSIS — G8918 Other acute postprocedural pain: Secondary | ICD-10-CM | POA: Diagnosis not present

## 2020-09-19 DIAGNOSIS — E785 Hyperlipidemia, unspecified: Secondary | ICD-10-CM | POA: Diagnosis not present

## 2020-09-19 DIAGNOSIS — M7989 Other specified soft tissue disorders: Secondary | ICD-10-CM | POA: Diagnosis not present

## 2020-09-19 DIAGNOSIS — E119 Type 2 diabetes mellitus without complications: Secondary | ICD-10-CM | POA: Diagnosis not present

## 2020-09-19 DIAGNOSIS — Z01812 Encounter for preprocedural laboratory examination: Secondary | ICD-10-CM | POA: Insufficient documentation

## 2020-09-19 DIAGNOSIS — T84093A Other mechanical complication of internal left knee prosthesis, initial encounter: Secondary | ICD-10-CM | POA: Diagnosis not present

## 2020-09-19 DIAGNOSIS — T84033A Mechanical loosening of internal left knee prosthetic joint, initial encounter: Secondary | ICD-10-CM | POA: Diagnosis not present

## 2020-09-19 HISTORY — DX: Other specified postprocedural states: Z98.890

## 2020-09-19 HISTORY — DX: Other specified postprocedural states: R11.2

## 2020-09-19 LAB — BASIC METABOLIC PANEL
Anion gap: 9 (ref 5–15)
BUN: 10 mg/dL (ref 8–23)
CO2: 26 mmol/L (ref 22–32)
Calcium: 9.9 mg/dL (ref 8.9–10.3)
Chloride: 105 mmol/L (ref 98–111)
Creatinine, Ser: 0.93 mg/dL (ref 0.44–1.00)
GFR, Estimated: >60 mL/min (ref >60)
Glucose, Bld: 166 mg/dL — ABNORMAL HIGH (ref 70–99)
Potassium: 4.1 mmol/L (ref 3.5–5.1)
Sodium: 140 mmol/L (ref 135–145)

## 2020-09-19 LAB — CBC
HCT: 40.2 % (ref 36.0–46.0)
Hemoglobin: 12.5 g/dL (ref 12.0–15.0)
MCH: 26.8 pg (ref 26.0–34.0)
MCHC: 31.1 g/dL (ref 30.0–36.0)
MCV: 86.3 fL (ref 80.0–100.0)
Platelets: 239 10*3/uL (ref 150–400)
RBC: 4.66 MIL/uL (ref 3.87–5.11)
RDW: 13.9 % (ref 11.5–15.5)
WBC: 7.5 10*3/uL (ref 4.0–10.5)
nRBC: 0 % (ref 0.0–0.2)

## 2020-09-19 LAB — SARS CORONAVIRUS 2 (TAT 6-24 HRS): SARS Coronavirus 2: NEGATIVE

## 2020-09-19 LAB — SURGICAL PCR SCREEN
MRSA, PCR: NEGATIVE
Staphylococcus aureus: NEGATIVE

## 2020-09-19 NOTE — Progress Notes (Signed)
PCP - Janett Billow [place] Cardiologist - Stanford Breed - several years since she saw him   Chest x-ray - not needed EKG - 09/19/20 Stress Test - ?? ECHO - 07/12/15 Cardiac Cath - denies  Fasting Blood Sugar - 117-200 Checks Blood Sugar __2___ times a day  Aspirin Instructions: LD 09/18/20  ERAS Protcol - yes, water given to patient due to DM dx PRE-SURGERY Ensure or G2- clears until 0430  COVID TEST- covid test pending   Anesthesia review: yes  Patient denies shortness of breath, fever, cough and chest pain at PAT appointment   All instructions explained to the patient, with a verbal understanding of the material. Patient agrees to go over the instructions while at home for a better understanding. Patient also instructed to self quarantine after being tested for COVID-19. The opportunity to ask questions was provided.

## 2020-09-21 ENCOUNTER — Inpatient Hospital Stay (HOSPITAL_COMMUNITY): Payer: Medicare Other | Admitting: Physician Assistant

## 2020-09-21 ENCOUNTER — Encounter (HOSPITAL_COMMUNITY)
Admission: RE | Disposition: A | Discharge status: Home / Self Care | Payer: Self-pay | Source: Home / Self Care | Attending: Orthopaedic Surgery

## 2020-09-21 ENCOUNTER — Inpatient Hospital Stay (HOSPITAL_COMMUNITY): Payer: Medicare Other

## 2020-09-21 ENCOUNTER — Inpatient Hospital Stay (HOSPITAL_COMMUNITY): Payer: Medicare Other | Admitting: Anesthesiology

## 2020-09-21 ENCOUNTER — Inpatient Hospital Stay (HOSPITAL_COMMUNITY)
Admission: RE | Admit: 2020-09-21 | Discharge: 2020-09-25 | DRG: 467 | Disposition: A | Discharge status: Home / Self Care | Payer: Medicare Other | Attending: Orthopaedic Surgery | Admitting: Orthopaedic Surgery

## 2020-09-21 ENCOUNTER — Encounter (HOSPITAL_COMMUNITY): Payer: Self-pay | Admitting: Orthopaedic Surgery

## 2020-09-21 DIAGNOSIS — Z83438 Family history of other disorder of lipoprotein metabolism and other lipidemia: Secondary | ICD-10-CM | POA: Diagnosis not present

## 2020-09-21 DIAGNOSIS — Y792 Prosthetic and other implants, materials and accessory orthopedic devices associated with adverse incidents: Secondary | ICD-10-CM | POA: Diagnosis present

## 2020-09-21 DIAGNOSIS — M25562 Pain in left knee: Secondary | ICD-10-CM | POA: Diagnosis present

## 2020-09-21 DIAGNOSIS — Z833 Family history of diabetes mellitus: Secondary | ICD-10-CM | POA: Diagnosis not present

## 2020-09-21 DIAGNOSIS — J449 Chronic obstructive pulmonary disease, unspecified: Secondary | ICD-10-CM | POA: Diagnosis present

## 2020-09-21 DIAGNOSIS — T84093A Other mechanical complication of internal left knee prosthesis, initial encounter: Principal | ICD-10-CM

## 2020-09-21 DIAGNOSIS — J9811 Atelectasis: Secondary | ICD-10-CM | POA: Diagnosis not present

## 2020-09-21 DIAGNOSIS — Z841 Family history of disorders of kidney and ureter: Secondary | ICD-10-CM

## 2020-09-21 DIAGNOSIS — Z96652 Presence of left artificial knee joint: Secondary | ICD-10-CM | POA: Diagnosis not present

## 2020-09-21 DIAGNOSIS — M2540 Effusion, unspecified joint: Secondary | ICD-10-CM | POA: Diagnosis present

## 2020-09-21 DIAGNOSIS — I10 Essential (primary) hypertension: Secondary | ICD-10-CM | POA: Diagnosis present

## 2020-09-21 DIAGNOSIS — Z20822 Contact with and (suspected) exposure to covid-19: Secondary | ICD-10-CM | POA: Diagnosis present

## 2020-09-21 DIAGNOSIS — G2581 Restless legs syndrome: Secondary | ICD-10-CM | POA: Diagnosis present

## 2020-09-21 DIAGNOSIS — Z8261 Family history of arthritis: Secondary | ICD-10-CM | POA: Diagnosis not present

## 2020-09-21 DIAGNOSIS — E785 Hyperlipidemia, unspecified: Secondary | ICD-10-CM | POA: Diagnosis present

## 2020-09-21 DIAGNOSIS — E119 Type 2 diabetes mellitus without complications: Secondary | ICD-10-CM | POA: Diagnosis present

## 2020-09-21 DIAGNOSIS — T84033A Mechanical loosening of internal left knee prosthetic joint, initial encounter: Secondary | ICD-10-CM

## 2020-09-21 DIAGNOSIS — D62 Acute posthemorrhagic anemia: Secondary | ICD-10-CM | POA: Diagnosis not present

## 2020-09-21 DIAGNOSIS — F419 Anxiety disorder, unspecified: Secondary | ICD-10-CM | POA: Diagnosis present

## 2020-09-21 DIAGNOSIS — R509 Fever, unspecified: Secondary | ICD-10-CM

## 2020-09-21 DIAGNOSIS — Z8249 Family history of ischemic heart disease and other diseases of the circulatory system: Secondary | ICD-10-CM | POA: Diagnosis not present

## 2020-09-21 HISTORY — PX: TOTAL KNEE REVISION: SHX996

## 2020-09-21 LAB — GLUCOSE, CAPILLARY
Glucose-Capillary: 166 mg/dL — ABNORMAL HIGH (ref 70–99)
Glucose-Capillary: 189 mg/dL — ABNORMAL HIGH (ref 70–99)
Glucose-Capillary: 227 mg/dL — ABNORMAL HIGH (ref 70–99)

## 2020-09-21 LAB — ABO/RH: ABO/RH(D): O POS

## 2020-09-21 SURGERY — TOTAL KNEE REVISION
Anesthesia: General | Site: Knee | Laterality: Left

## 2020-09-21 MED ORDER — ONDANSETRON HCL 4 MG/2ML IJ SOLN
INTRAMUSCULAR | Status: DC | PRN
  Administered 2020-09-21: 4 mg via INTRAVENOUS

## 2020-09-21 MED ORDER — ALBUTEROL SULFATE HFA 108 (90 BASE) MCG/ACT IN AERS
2.0000 | INHALATION_SPRAY | RESPIRATORY_TRACT | Status: DC | PRN
  Filled 2020-09-21: qty 6.7

## 2020-09-21 MED ORDER — FENTANYL CITRATE (PF) 100 MCG/2ML IJ SOLN
INTRAMUSCULAR | Status: AC
  Filled 2020-09-21: qty 2

## 2020-09-21 MED ORDER — PHENOL 1.4 % MT LIQD
1.0000 | OROMUCOSAL | Status: DC | PRN
Start: 2020-09-21 — End: 2020-09-25

## 2020-09-21 MED ORDER — METFORMIN HCL 500 MG PO TABS
1000.0000 mg | ORAL_TABLET | Freq: Two times a day (BID) | ORAL | Status: DC
  Administered 2020-09-21 – 2020-09-25 (×8): 1000 mg via ORAL
  Filled 2020-09-21 (×8): qty 2

## 2020-09-21 MED ORDER — METOCLOPRAMIDE HCL 5 MG PO TABS: 5.0000 mg | ORAL_TABLET | Freq: Three times a day (TID) | ORAL | Status: DC | PRN

## 2020-09-21 MED ORDER — FENTANYL CITRATE (PF) 250 MCG/5ML IJ SOLN
INTRAMUSCULAR | Status: AC
  Filled 2020-09-21: qty 5

## 2020-09-21 MED ORDER — ONDANSETRON HCL 4 MG PO TABS: 4.0000 mg | ORAL_TABLET | Freq: Four times a day (QID) | ORAL | Status: DC | PRN

## 2020-09-21 MED ORDER — OXYCODONE HCL 5 MG PO TABS
10.0000 mg | ORAL_TABLET | ORAL | Status: DC | PRN
  Administered 2020-09-21: 10 mg via ORAL
  Administered 2020-09-22: 15 mg via ORAL
  Administered 2020-09-22: 10 mg via ORAL
  Administered 2020-09-22 – 2020-09-24 (×6): 15 mg via ORAL
  Filled 2020-09-21 (×3): qty 3
  Filled 2020-09-21: qty 2
  Filled 2020-09-21 (×4): qty 3

## 2020-09-21 MED ORDER — MIDAZOLAM HCL 2 MG/2ML IJ SOLN: 2.0000 mg | Freq: Once | INTRAMUSCULAR | Status: AC

## 2020-09-21 MED ORDER — LACTATED RINGERS IV SOLN: INTRAVENOUS | Status: DC

## 2020-09-21 MED ORDER — CLINDAMYCIN PHOSPHATE 900 MG/50ML IV SOLN
900.0000 mg | INTRAVENOUS | Status: AC
  Administered 2020-09-21: 900 mg via INTRAVENOUS
  Filled 2020-09-21: qty 50

## 2020-09-21 MED ORDER — ORAL CARE MOUTH RINSE: 15.0000 mL | Freq: Once | OROMUCOSAL | Status: AC

## 2020-09-21 MED ORDER — NORTRIPTYLINE HCL 10 MG PO CAPS
20.0000 mg | ORAL_CAPSULE | Freq: Every day | ORAL | Status: DC
  Administered 2020-09-21 – 2020-09-24 (×4): 20 mg via ORAL
  Filled 2020-09-21 (×5): qty 2

## 2020-09-21 MED ORDER — ONDANSETRON HCL 4 MG/2ML IJ SOLN
INTRAMUSCULAR | Status: AC
  Filled 2020-09-21: qty 2

## 2020-09-21 MED ORDER — ACETAMINOPHEN 325 MG PO TABS
325.0000 mg | ORAL_TABLET | Freq: Four times a day (QID) | ORAL | Status: DC | PRN
Start: 2020-09-22 — End: 2020-09-25
  Administered 2020-09-22 – 2020-09-25 (×7): 650 mg via ORAL
  Filled 2020-09-21 (×6): qty 2

## 2020-09-21 MED ORDER — NALOXONE HCL 0.4 MG/ML IJ SOLN
INTRAMUSCULAR | Status: DC | PRN
  Administered 2020-09-21 (×3): .1 mg via INTRAVENOUS

## 2020-09-21 MED ORDER — SUCCINYLCHOLINE CHLORIDE 200 MG/10ML IV SOSY
PREFILLED_SYRINGE | INTRAVENOUS | Status: DC | PRN
  Administered 2020-09-21: 160 mg via INTRAVENOUS

## 2020-09-21 MED ORDER — FENTANYL CITRATE (PF) 100 MCG/2ML IJ SOLN
INTRAMUSCULAR | Status: AC
  Administered 2020-09-21: 100 ug via INTRAVENOUS
  Filled 2020-09-21: qty 2

## 2020-09-21 MED ORDER — FENTANYL CITRATE (PF) 100 MCG/2ML IJ SOLN
100.0000 ug | Freq: Once | INTRAMUSCULAR | Status: AC
Start: 2020-09-21 — End: 2020-09-21

## 2020-09-21 MED ORDER — SODIUM CHLORIDE 0.9 % IV SOLN: INTRAVENOUS | Status: DC

## 2020-09-21 MED ORDER — VITAMIN E 45 MG (100 UNIT) PO CAPS
400.0000 [IU] | ORAL_CAPSULE | Freq: Every day | ORAL | Status: DC
  Administered 2020-09-21 – 2020-09-24 (×4): 400 [IU] via ORAL
  Filled 2020-09-21 (×5): qty 4

## 2020-09-21 MED ORDER — METHOCARBAMOL 1000 MG/10ML IJ SOLN
500.0000 mg | Freq: Four times a day (QID) | INTRAVENOUS | Status: DC | PRN
  Administered 2020-09-21: 500 mg via INTRAVENOUS
  Filled 2020-09-21: qty 5

## 2020-09-21 MED ORDER — KETOROLAC TROMETHAMINE 15 MG/ML IJ SOLN
7.5000 mg | Freq: Four times a day (QID) | INTRAMUSCULAR | Status: DC
  Administered 2020-09-21 – 2020-09-22 (×3): 7.5 mg via INTRAVENOUS
  Filled 2020-09-21 (×3): qty 1

## 2020-09-21 MED ORDER — METHOCARBAMOL 500 MG PO TABS
500.0000 mg | ORAL_TABLET | Freq: Four times a day (QID) | ORAL | Status: DC | PRN
  Administered 2020-09-22 – 2020-09-25 (×4): 500 mg via ORAL
  Filled 2020-09-21 (×5): qty 1

## 2020-09-21 MED ORDER — PROPOFOL 10 MG/ML IV BOLUS
INTRAVENOUS | Status: DC | PRN
  Administered 2020-09-21: 150 mg via INTRAVENOUS

## 2020-09-21 MED ORDER — ROCURONIUM BROMIDE 100 MG/10ML IV SOLN
INTRAVENOUS | Status: DC | PRN
  Administered 2020-09-21: 10 mg via INTRAVENOUS
  Administered 2020-09-21: 20 mg via INTRAVENOUS

## 2020-09-21 MED ORDER — LISINOPRIL 5 MG PO TABS
5.0000 mg | ORAL_TABLET | Freq: Every day | ORAL | Status: DC
  Administered 2020-09-21 – 2020-09-25 (×5): 5 mg via ORAL
  Filled 2020-09-21 (×5): qty 1

## 2020-09-21 MED ORDER — DIPHENHYDRAMINE HCL 12.5 MG/5ML PO ELIX
12.5000 mg | ORAL_SOLUTION | ORAL | Status: DC | PRN
  Administered 2020-09-21 (×2): 25 mg via ORAL
  Filled 2020-09-21 (×2): qty 10

## 2020-09-21 MED ORDER — LIDOCAINE 2% (20 MG/ML) 5 ML SYRINGE
INTRAMUSCULAR | Status: AC
  Filled 2020-09-21: qty 5

## 2020-09-21 MED ORDER — ONDANSETRON HCL 4 MG/2ML IJ SOLN: 4.0000 mg | Freq: Four times a day (QID) | INTRAMUSCULAR | Status: DC | PRN

## 2020-09-21 MED ORDER — ASPIRIN 81 MG PO CHEW
81.0000 mg | CHEWABLE_TABLET | Freq: Two times a day (BID) | ORAL | Status: DC
  Administered 2020-09-21 – 2020-09-25 (×8): 81 mg via ORAL
  Filled 2020-09-21 (×9): qty 1

## 2020-09-21 MED ORDER — FENTANYL CITRATE (PF) 100 MCG/2ML IJ SOLN
25.0000 ug | INTRAMUSCULAR | Status: AC | PRN
  Administered 2020-09-21 (×6): 25 ug via INTRAVENOUS

## 2020-09-21 MED ORDER — VITAMIN B-12 1000 MCG PO TABS
1000.0000 ug | ORAL_TABLET | Freq: Every day | ORAL | Status: DC
  Administered 2020-09-22 – 2020-09-25 (×4): 1000 ug via ORAL
  Filled 2020-09-21 (×4): qty 1

## 2020-09-21 MED ORDER — 0.9 % SODIUM CHLORIDE (POUR BTL) OPTIME
TOPICAL | Status: DC | PRN
  Administered 2020-09-21: 1000 mL

## 2020-09-21 MED ORDER — CO Q-10 100 MG PO CAPS: 100.0000 mg | ORAL_CAPSULE | Freq: Every day | ORAL | Status: DC

## 2020-09-21 MED ORDER — ALUM & MAG HYDROXIDE-SIMETH 200-200-20 MG/5ML PO SUSP: 30.0000 mL | ORAL | Status: DC | PRN

## 2020-09-21 MED ORDER — FENTANYL CITRATE (PF) 100 MCG/2ML IJ SOLN
INTRAMUSCULAR | Status: DC | PRN
  Administered 2020-09-21: 100 ug via INTRAVENOUS
  Administered 2020-09-21: 50 ug via INTRAVENOUS
  Administered 2020-09-21: 100 ug via INTRAVENOUS

## 2020-09-21 MED ORDER — DOCUSATE SODIUM 100 MG PO CAPS
100.0000 mg | ORAL_CAPSULE | Freq: Two times a day (BID) | ORAL | Status: DC
  Administered 2020-09-21 – 2020-09-25 (×9): 100 mg via ORAL
  Filled 2020-09-21 (×9): qty 1

## 2020-09-21 MED ORDER — VITAMIN D 25 MCG (1000 UNIT) PO TABS
1000.0000 [IU] | ORAL_TABLET | Freq: Every day | ORAL | Status: DC
  Administered 2020-09-21 – 2020-09-24 (×4): 1000 [IU] via ORAL
  Filled 2020-09-21 (×4): qty 1

## 2020-09-21 MED ORDER — GABAPENTIN 300 MG PO CAPS
300.0000 mg | ORAL_CAPSULE | Freq: Two times a day (BID) | ORAL | Status: DC
  Administered 2020-09-21 – 2020-09-25 (×8): 300 mg via ORAL
  Filled 2020-09-21 (×8): qty 1

## 2020-09-21 MED ORDER — PROPOFOL 10 MG/ML IV BOLUS
INTRAVENOUS | Status: AC
  Filled 2020-09-21: qty 20

## 2020-09-21 MED ORDER — HYDROCHLOROTHIAZIDE 10 MG/ML ORAL SUSPENSION
6.2500 mg | Freq: Every day | ORAL | Status: DC
  Administered 2020-09-21 – 2020-09-25 (×5): 6.25 mg via ORAL
  Filled 2020-09-21 (×6): qty 1.25

## 2020-09-21 MED ORDER — MENTHOL 3 MG MT LOZG: 1.0000 | LOZENGE | OROMUCOSAL | Status: DC | PRN

## 2020-09-21 MED ORDER — POVIDONE-IODINE 10 % EX SWAB: 2.0000 "application " | Freq: Once | CUTANEOUS | Status: DC

## 2020-09-21 MED ORDER — ACETAMINOPHEN 10 MG/ML IV SOLN
INTRAVENOUS | Status: DC | PRN
  Administered 2020-09-21: 1000 mg via INTRAVENOUS

## 2020-09-21 MED ORDER — PANTOPRAZOLE SODIUM 40 MG PO TBEC
40.0000 mg | DELAYED_RELEASE_TABLET | Freq: Every day | ORAL | Status: DC
  Administered 2020-09-21 – 2020-09-25 (×5): 40 mg via ORAL
  Filled 2020-09-21 (×5): qty 1

## 2020-09-21 MED ORDER — HYPROMELLOSE (GONIOSCOPIC) 2.5 % OP SOLN
1.0000 [drp] | Freq: Three times a day (TID) | OPHTHALMIC | Status: DC | PRN
  Filled 2020-09-21: qty 15

## 2020-09-21 MED ORDER — CHLORHEXIDINE GLUCONATE 0.12 % MT SOLN
15.0000 mL | Freq: Once | OROMUCOSAL | Status: AC
  Administered 2020-09-21: 15 mL via OROMUCOSAL

## 2020-09-21 MED ORDER — DEXAMETHASONE SODIUM PHOSPHATE 10 MG/ML IJ SOLN
INTRAMUSCULAR | Status: AC
  Filled 2020-09-21: qty 1

## 2020-09-21 MED ORDER — OXYCODONE HCL 5 MG PO TABS
5.0000 mg | ORAL_TABLET | ORAL | Status: DC | PRN
  Administered 2020-09-21 – 2020-09-23 (×2): 5 mg via ORAL
  Administered 2020-09-25 (×2): 10 mg via ORAL
  Filled 2020-09-21: qty 1
  Filled 2020-09-21: qty 2
  Filled 2020-09-21: qty 1
  Filled 2020-09-21 (×2): qty 2

## 2020-09-21 MED ORDER — CITALOPRAM HYDROBROMIDE 20 MG PO TABS
20.0000 mg | ORAL_TABLET | Freq: Every day | ORAL | Status: DC
  Administered 2020-09-22 – 2020-09-25 (×4): 20 mg via ORAL
  Filled 2020-09-21 (×4): qty 1

## 2020-09-21 MED ORDER — CHLORHEXIDINE GLUCONATE 0.12 % MT SOLN
15.0000 mL | Freq: Once | OROMUCOSAL | Status: AC
  Administered 2020-09-21: 15 mL via OROMUCOSAL
  Filled 2020-09-21: qty 15

## 2020-09-21 MED ORDER — DEXAMETHASONE SODIUM PHOSPHATE 4 MG/ML IJ SOLN
INTRAMUSCULAR | Status: DC | PRN
  Administered 2020-09-21: 4 mg via INTRAVENOUS

## 2020-09-21 MED ORDER — NALOXONE HCL 0.4 MG/ML IJ SOLN
INTRAMUSCULAR | Status: AC
  Filled 2020-09-21: qty 1

## 2020-09-21 MED ORDER — ALPRAZOLAM 0.5 MG PO TABS
1.0000 mg | ORAL_TABLET | Freq: Every day | ORAL | Status: DC
  Administered 2020-09-21 – 2020-09-24 (×4): 1 mg via ORAL
  Filled 2020-09-21 (×4): qty 2

## 2020-09-21 MED ORDER — MIDAZOLAM HCL 2 MG/2ML IJ SOLN
INTRAMUSCULAR | Status: AC
  Administered 2020-09-21: 2 mg via INTRAVENOUS
  Filled 2020-09-21: qty 2

## 2020-09-21 MED ORDER — TRANEXAMIC ACID-NACL 1000-0.7 MG/100ML-% IV SOLN
1000.0000 mg | INTRAVENOUS | Status: AC
  Administered 2020-09-21: 1000 mg via INTRAVENOUS
  Filled 2020-09-21: qty 100

## 2020-09-21 MED ORDER — METOCLOPRAMIDE HCL 5 MG/ML IJ SOLN: 5.0000 mg | Freq: Three times a day (TID) | INTRAMUSCULAR | Status: DC | PRN

## 2020-09-21 MED ORDER — LIDOCAINE HCL (CARDIAC) PF 100 MG/5ML IV SOSY
PREFILLED_SYRINGE | INTRAVENOUS | Status: DC | PRN
  Administered 2020-09-21: 50 mg via INTRAVENOUS

## 2020-09-21 MED ORDER — ROCURONIUM BROMIDE 10 MG/ML (PF) SYRINGE
PREFILLED_SYRINGE | INTRAVENOUS | Status: AC
  Filled 2020-09-21: qty 10

## 2020-09-21 MED ORDER — SUGAMMADEX SODIUM 200 MG/2ML IV SOLN
INTRAVENOUS | Status: DC | PRN
  Administered 2020-09-21: 200 mg via INTRAVENOUS

## 2020-09-21 MED ORDER — HYDROMORPHONE HCL 1 MG/ML IJ SOLN
0.5000 mg | INTRAMUSCULAR | Status: DC | PRN
  Administered 2020-09-21: 1 mg via INTRAVENOUS
  Filled 2020-09-21: qty 1

## 2020-09-21 MED ORDER — LISINOPRIL-HYDROCHLOROTHIAZIDE 10-12.5 MG PO TABS: 0.5000 | ORAL_TABLET | Freq: Every day | ORAL | Status: DC

## 2020-09-21 MED ORDER — CLINDAMYCIN PHOSPHATE 600 MG/50ML IV SOLN
600.0000 mg | Freq: Four times a day (QID) | INTRAVENOUS | Status: AC
  Administered 2020-09-21 (×2): 600 mg via INTRAVENOUS
  Filled 2020-09-21 (×2): qty 50

## 2020-09-21 MED ORDER — SUCCINYLCHOLINE CHLORIDE 200 MG/10ML IV SOSY
PREFILLED_SYRINGE | INTRAVENOUS | Status: AC
  Filled 2020-09-21: qty 10

## 2020-09-21 MED ORDER — SODIUM CHLORIDE 0.9 % IR SOLN
Status: DC | PRN
  Administered 2020-09-21: 3000 mL

## 2020-09-21 SURGICAL SUPPLY — 72 items
ATTUNE PS FEM LT SZ 3 CEM KNEE (Femur) ×2 IMPLANT
BANDAGE ESMARK 6X9 LF (GAUZE/BANDAGES/DRESSINGS) ×1 IMPLANT
BASE TIBIAL ROT PLAT SZ 3 KNEE (Knees) ×1 IMPLANT
BLADE CLIPPER SURG (BLADE) IMPLANT
BLADE SAG 18X100X1.27 (BLADE) ×2 IMPLANT
BLADE SAGITTAL 25.0X1.27X90 (BLADE) ×2 IMPLANT
BNDG COHESIVE 6X5 TAN STRL LF (GAUZE/BANDAGES/DRESSINGS) ×4 IMPLANT
BNDG ELASTIC 6X10 VLCR STRL LF (GAUZE/BANDAGES/DRESSINGS) ×2 IMPLANT
BNDG ELASTIC 6X5.8 VLCR STR LF (GAUZE/BANDAGES/DRESSINGS) ×2 IMPLANT
BNDG ESMARK 6X9 LF (GAUZE/BANDAGES/DRESSINGS) ×2
BOWL SMART MIX CTS (DISPOSABLE) IMPLANT
CEMENT BONE SIMPLEX SPEEDSET (Cement) ×4 IMPLANT
COVER SURGICAL LIGHT HANDLE (MISCELLANEOUS) ×2 IMPLANT
COVER WAND RF STERILE (DRAPES) ×2 IMPLANT
CUFF TOURN SGL QUICK 34 (TOURNIQUET CUFF)
CUFF TOURN SGL QUICK 42 (TOURNIQUET CUFF) IMPLANT
CUFF TRNQT CYL 34X4.125X (TOURNIQUET CUFF) IMPLANT
DRAPE ORTHO SPLIT 77X108 STRL (DRAPES) ×4
DRAPE SURG ORHT 6 SPLT 77X108 (DRAPES) ×2 IMPLANT
DRAPE U-SHAPE 47X51 STRL (DRAPES) ×2 IMPLANT
DRSG PAD ABDOMINAL 8X10 ST (GAUZE/BANDAGES/DRESSINGS) ×4 IMPLANT
DRSG XEROFORM 1X8 (GAUZE/BANDAGES/DRESSINGS) ×2 IMPLANT
DURAPREP 26ML APPLICATOR (WOUND CARE) ×2 IMPLANT
ELECT REM PT RETURN 9FT ADLT (ELECTROSURGICAL) ×2
ELECTRODE REM PT RTRN 9FT ADLT (ELECTROSURGICAL) ×1 IMPLANT
EVACUATOR 1/8 PVC DRAIN (DRAIN) IMPLANT
FACESHIELD STD STERILE (MASK) ×4 IMPLANT
GAUZE SPONGE 4X4 12PLY STRL (GAUZE/BANDAGES/DRESSINGS) ×2 IMPLANT
GAUZE SPONGE 4X4 16PLY XRAY LF (GAUZE/BANDAGES/DRESSINGS) ×2 IMPLANT
GAUZE XEROFORM 1X8 LF (GAUZE/BANDAGES/DRESSINGS) ×2 IMPLANT
GLOVE BIO SURGEON STRL SZ8 (GLOVE) ×2 IMPLANT
GLOVE ORTHO TXT STRL SZ7.5 (GLOVE) ×2 IMPLANT
GLOVE SRG 8 PF TXTR STRL LF DI (GLOVE) ×2 IMPLANT
GLOVE SURG UNDER POLY LF SZ8 (GLOVE) ×4
GOWN STRL REUS W/ TWL LRG LVL3 (GOWN DISPOSABLE) ×1 IMPLANT
GOWN STRL REUS W/ TWL XL LVL3 (GOWN DISPOSABLE) ×2 IMPLANT
GOWN STRL REUS W/TWL LRG LVL3 (GOWN DISPOSABLE) ×2
GOWN STRL REUS W/TWL XL LVL3 (GOWN DISPOSABLE) ×4
HANDPIECE INTERPULSE COAX TIP (DISPOSABLE)
IMMOBILIZER KNEE 22 UNIV (SOFTGOODS) ×2 IMPLANT
INSERT TIB ATTUNE RP SZ3X14 (Insert) ×2 IMPLANT
KIT BASIN OR (CUSTOM PROCEDURE TRAY) ×2 IMPLANT
KIT TURNOVER KIT B (KITS) ×2 IMPLANT
MANIFOLD NEPTUNE II (INSTRUMENTS) ×2 IMPLANT
NS IRRIG 1000ML POUR BTL (IV SOLUTION) ×2 IMPLANT
PACK TOTAL JOINT (CUSTOM PROCEDURE TRAY) ×2 IMPLANT
PAD ABD 8X10 STRL (GAUZE/BANDAGES/DRESSINGS) ×2 IMPLANT
PAD ARMBOARD 7.5X6 YLW CONV (MISCELLANEOUS) ×4 IMPLANT
PADDING CAST COTTON 6X4 STRL (CAST SUPPLIES) ×2 IMPLANT
PATELLA MEDIAL ATTUN 35MM KNEE (Knees) ×2 IMPLANT
PIN DRILL FIX HALF THREAD (BIT) ×2 IMPLANT
PIN FIX SIGMA LCS THRD HI (PIN) ×2 IMPLANT
SET HNDPC FAN SPRY TIP SCT (DISPOSABLE) IMPLANT
SET PAD KNEE POSITIONER (MISCELLANEOUS) ×2 IMPLANT
STAPLER VISISTAT 35W (STAPLE) ×2 IMPLANT
SUCTION FRAZIER HANDLE 10FR (MISCELLANEOUS) ×2
SUCTION TUBE FRAZIER 10FR DISP (MISCELLANEOUS) ×1 IMPLANT
SUT VIC AB 0 CT1 27 (SUTURE) ×4
SUT VIC AB 0 CT1 27XBRD ANBCTR (SUTURE) ×2 IMPLANT
SUT VIC AB 1 CT1 27 (SUTURE) ×4
SUT VIC AB 1 CT1 27XBRD ANBCTR (SUTURE) ×2 IMPLANT
SUT VIC AB 2-0 CT1 27 (SUTURE) ×4
SUT VIC AB 2-0 CT1 TAPERPNT 27 (SUTURE) ×2 IMPLANT
SWAB COLLECTION DEVICE MRSA (MISCELLANEOUS) IMPLANT
SWAB CULTURE ESWAB REG 1ML (MISCELLANEOUS) ×2 IMPLANT
TIBIAL BASE ROT PLAT SZ 3 KNEE (Knees) ×2 IMPLANT
TOWEL GREEN STERILE (TOWEL DISPOSABLE) ×2 IMPLANT
TOWEL GREEN STERILE FF (TOWEL DISPOSABLE) ×2 IMPLANT
TRAY FOLEY W/BAG SLVR 16FR (SET/KITS/TRAYS/PACK)
TRAY FOLEY W/BAG SLVR 16FR ST (SET/KITS/TRAYS/PACK) IMPLANT
WATER STERILE IRR 1000ML POUR (IV SOLUTION) ×6 IMPLANT
WRAP KNEE MAXI GEL POST OP (GAUZE/BANDAGES/DRESSINGS) ×2 IMPLANT

## 2020-09-21 NOTE — H&P (Signed)
TOTAL KNEE REVISION ADMISSION H&P  Patient is being admitted for left revision total knee arthroplasty.  Subjective:  Chief Complaint:left knee pain.  HPI: Jodi Williams, 70 y.o. female, has a history of pain and functional disability in the left knee(s) due to failed previous arthroplasty and patient has failed non-surgical conservative treatments for greater than 12 weeks to include NSAID's and/or analgesics, flexibility and strengthening excercises and activity modification. The indications for the revision of the total knee arthroplasty are loosening of one or more components and bearing surface wear leading to symptomatic synovitis. Onset of symptoms was gradual starting 2 years ago with gradually worsening course since that time.  Prior procedures on the left knee(s) include unicompartmental arthroplasty.  Patient currently rates pain in the left knee(s) at 9 out of 10 with activity. There is worsening of pain with activity and weight bearing, pain that interferes with activities of daily living, pain with passive range of motion and joint swelling.  Patient has evidence of prosthetic loosening by imaging studies. This condition presents safety issues increasing the risk of falls.  There is no current active infection.  Patient Active Problem List   Diagnosis Date Noted  . 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 History of partial knee replacement 11/03/2017  . Chronic pain of left knee 11/03/2017  . Chronic migraine 03/31/2017  . Preop cardiovascular exam 07/12/2015  . Dyspnea 07/12/2015  . Chest pain 07/12/2015  . Restless legs 05/24/2015  . Diabetes ([place]) 08/24/2013  . Other and unspecified hyperlipidemia 09/07/2012  . HTN (hypertension) 04/12/2012  . OA (osteoarthritis) of knee 04/12/2012  . 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 Depression with anxiety 04/12/2012  . H/O gastric ulcer 04/12/2012  . ALLERGIC RHINITIS WITH CONJUNCTIVITIS 09/08/2007  . ASTHMA  07/20/2007   Past Medical History:  Diagnosis Date  . Allergy   . Anxiety   . Arthritis   . Asthma   . Cataract   . COPD (chronic obstructive pulmonary disease) ([place])   . Depression   . Diabetes mellitus ([place])   . Hyperlipidemia   . Hypertension   . PONV (postoperative nausea and vomiting)   . Ulcer     Past Surgical History:  Procedure Laterality Date  . BACK SURGERY    . BREAST SURGERY    . COLONOSCOPY WITH ESOPHAGOGASTRODUODENOSCOPY (EGD)    . EYE SURGERY     cataract  . HAND SURGERY    . KNEE SURGERY      Current Facility-Administered Medications  Medication Dose Route Frequency Provider Last Rate Last Admin  . chlorhexidine (PERIDEX) 0.12 % solution 15 mL  15 mL Mouth/Throat Once Ellender, Karyl Kinnier, MD       Or  . MEDLINE mouth rinse  15 mL Mouth Rinse Once Ellender, Karyl Kinnier, MD      . clindamycin (CLEOCIN) IVPB 900 mg  900 mg Intravenous On Call to OR Pete Pelt, PA-C      . lactated ringers infusion   Intravenous Continuous Ellender, Karyl Kinnier, MD      . povidone-iodine 10 % swab 2 application  2 application Topical Once Pete Pelt, PA-C      . tranexamic acid (CYKLOKAPRON) IVPB 1,000 mg  1,000 mg Intravenous To OR Pete Pelt, PA-C       Allergies  Allergen Reactions  . Omnicef [Cefdinir] Rash    Social History   Tobacco Use  . Smoking status: Never Smoker  . Smokeless tobacco: Never Used  Substance Use Topics  . Alcohol use: Not Currently    Alcohol/week: 0.0 standard drinks    Family History  Problem Relation Age of Onset  . Hyperlipidemia Mother   . Hypertension Mother   . Heart disease Mother   . Dementia Mother   . Arthritis Mother   . Neuropathy Father   . Diabetes Father   . Kidney disease Father       Review of Systems  Musculoskeletal: Positive for joint swelling.  All other systems reviewed and are negative.    Objective:  Physical Exam Vitals reviewed.  Constitutional:      Appearance: Normal appearance.  HENT:      Head: Normocephalic and atraumatic.  Eyes:     Pupils: Pupils are equal, round, and reactive to light.  Cardiovascular:     Rate and Rhythm: Normal rate and regular rhythm.     Pulses: Normal pulses.  Pulmonary:     Effort: Pulmonary effort is normal.     Breath sounds: Normal breath sounds.  Abdominal:     Palpations: Abdomen is soft.  Musculoskeletal:     Cervical back: Normal range of motion.     Left knee: Swelling and effusion present. Tenderness present over the medial joint line.  Neurological:     Mental Status: She is alert and oriented to person, place, and time.  Psychiatric:        Behavior: Behavior normal.     Vital signs in last 24 hours: Temp:  [98.8 F (37.1 C)] 98.8 F (37.1 C) (05/12 0627) Pulse Rate:  [81] 81 (05/12 0627) Resp:  [18] 18 (05/12 0627) BP: (163)/(73) 163/73 (05/12 0627) SpO2:  [97 %] 97 % (05/12 0627) Weight:  [75.7 kg] 75.7 kg (05/12 0627)  Labs:  Estimated body mass index is 30.52 kg/m as calculated from the following:   Height as of this encounter: 5\' 2"  (1.575 m).   Weight as of this encounter: 75.7 kg.  Imaging Review Plain radiographs and 3-phase bone scan demonstrate evidence of loosening of the femoral and tibial components. The bone quality appears to be good for age and reported activity level.     Assessment/Plan:  Failed left knee unicompartmental knee replacement   The patient understands fully that we are proceeding with surgery today for removal of her failed left unicompartmental knee arthroplasty converting this to a total knee arthroplasty.  This is due to aseptic loosening.  The risks and benefits of surgery have been explained in length in detail and informed consent is obtained.  The left knee has been

## 2020-09-21 NOTE — Progress Notes (Signed)
Patient medicated with prn pain medication due to xray at this time being taken  patient reports pain at 10 prior to xray patient was sleeping snoring noted no moaning noted patient is awake and resting quietly no moaning or groaning noted at this time

## 2020-09-21 NOTE — Transfer of Care (Signed)
Immediate Anesthesia Transfer of Care Note  Patient: Jodi Williams  Procedure(s) Performed: LEFT KNEE REVISION ARTHROPLASTY (Left Knee)  Patient Location: PACU  Anesthesia Type:General and GA combined with regional for post-op pain  Level of Consciousness: awake, alert , oriented and patient cooperative  Airway & Oxygen Therapy: Patient Spontanous Breathing and Patient connected to nasal cannula oxygen  Post-op Assessment: Report given to RN, Post -op Vital signs reviewed and stable and Patient moving all extremities X 4  Post vital signs: Reviewed and stable  Last Vitals:  Vitals Value Taken Time  BP 136/62 09/21/20 1045  Temp 36.8 C 09/21/20 1040  Pulse 109 09/21/20 1050  Resp 11 09/21/20 1050  SpO2 100 % 09/21/20 1050  Vitals shown include unvalidated device data.  Last Pain:  Vitals:   09/21/20 1040  TempSrc:   PainSc: Asleep      Patients Stated Pain Goal: 4 (48/88/91 [id number])  Complications: No complications documented.

## 2020-09-21 NOTE — Plan of Care (Signed)

## 2020-09-21 NOTE — Anesthesia Preprocedure Evaluation (Addendum)
Anesthesia Evaluation  Patient identified by MRN, date of birth, ID band Patient awake    Reviewed: Allergy & Precautions, NPO status , Patient's Chart, lab work & pertinent test results  History of Anesthesia Complications (+) PONV  Airway Mallampati: II  TM Distance: >3 FB     Dental   Pulmonary    breath sounds clear to auscultation       Cardiovascular hypertension,  Rhythm:Regular Rate:Normal     Neuro/Psych    GI/Hepatic negative GI ROS, Neg liver ROS,   Endo/Other  diabetes  Renal/GU negative Renal ROS     Musculoskeletal   Abdominal   Peds  Hematology   Anesthesia Other Findings   Reproductive/Obstetrics                             Anesthesia Physical Anesthesia Plan  ASA: III  Anesthesia Plan: General   Post-op Pain Management:    Induction: Intravenous  PONV Risk Score and Plan: 4 or greater and Ondansetron, Dexamethasone and Midazolam  Airway Management Planned: Oral ETT  Additional Equipment:   Intra-op Plan:   Post-operative Plan: Extubation in OR  Informed Consent: I have reviewed the patients History and Physical, chart, labs and discussed the procedure including the risks, benefits and alternatives for the proposed anesthesia with the patient or authorized representative who has indicated his/her understanding and acceptance.     Dental advisory given  Plan Discussed with: CRNA and Anesthesiologist  Anesthesia Plan Comments:         Anesthesia Quick Evaluation

## 2020-09-21 NOTE — Anesthesia Procedure Notes (Signed)
Anesthesia Regional Block: Adductor canal block   Pre-Anesthetic Checklist: ,, timeout performed, Correct Patient, Correct Site, Correct Laterality, Correct Procedure, Correct Position, site marked, Risks and benefits discussed,  Surgical consent,  Pre-op evaluation,  At surgeon's request and post-op pain management  Laterality: Left  Prep: chloraprep       Needles:  Injection technique: Single-shot  Needle Type: Echogenic Stimulator Needle          Additional Needles:   Procedures: Doppler guided,,,, ultrasound used (permanent image in chart),,,,  Narrative:  Start time: 09/21/2020 7:30 AM End time: 09/21/2020 7:45 AM Injection made incrementally with aspirations every 5 mL. Anesthesiologist: Belinda Block, MD

## 2020-09-21 NOTE — Anesthesia Procedure Notes (Signed)
Procedure Name: Intubation Date/Time: 09/21/2020 8:14 AM Performed by: Alvy Bimler, CRNA Pre-anesthesia Checklist: Patient identified, Patient being monitored, Timeout performed, Emergency Drugs available and Suction available Patient Re-evaluated:Patient Re-evaluated prior to induction Oxygen Delivery Method: Circle System Utilized Preoxygenation: Pre-oxygenation with 100% oxygen Induction Type: IV induction Ventilation: Mask ventilation without difficulty Laryngoscope Size: Mac and 3 Grade View: Grade II Tube type: Oral Tube size: 7.0 mm Number of attempts: 1 Airway Equipment and Method: stylet Placement Confirmation: ETT inserted through vocal cords under direct vision,  positive ETCO2 and breath sounds checked- equal and bilateral Secured at: 21 cm Tube secured with: Tape Dental Injury: Teeth and Oropharynx as per pre-operative assessment

## 2020-09-21 NOTE — Brief Op Note (Signed)
09/21/2020  10:15 AM  PATIENT:  Jodi Williams  70 y.o. female  PRE-OPERATIVE DIAGNOSIS:  failed left partial knee replacement  POST-OPERATIVE DIAGNOSIS:  failed left partial knee replacement  PROCEDURE:  Procedure(s): LEFT KNEE REVISION ARTHROPLASTY (Left)  SURGEON:  Surgeon(s) and Role:    Mcarthur Rossetti, MD - Primary  PHYSICIAN ASSISTANT:  Benita Stabile, PA-C  ANESTHESIA:   regional and general  COUNTS:  YES  TOURNIQUET:   Total Tourniquet Time Documented: Thigh (Left) - 73 minutes Total: Thigh (Left) - 73 minutes   DICTATION: .Other Dictation: Dictation Number [id number]  PLAN OF CARE: Admit to inpatient   PATIENT DISPOSITION:  PACU - hemodynamically stable.   Delay start of Pharmacological VTE agent (>24hrs) due to surgical blood loss or risk of bleeding: no

## 2020-09-21 NOTE — Evaluation (Signed)
Physical Therapy Evaluation Patient Details Name: Jodi Williams MRN: [id number] DOB: 1951/04/09 Today's Date: 09/21/2020   History of Present Illness  Pt is a 70 y/o female s/p L total knee revision. PMH includes bilateral partial knee replacements, DM, HTN, and COPD.  Clinical Impression  Pt is s/p surgery above with deficits below. Pt reporting increased lightheadedness which limited mobility. Checked BP and was [facility]. RN checked CBG and it was [facility]. Requiring min to min guard A to perform transfers this session. Anticipate pt will progress well once symptoms improve. Will continue to follow acutely.     Follow Up Recommendations Follow surgeon's recommendation for DC plan and follow-up therapies    Equipment Recommendations  3in1 (PT)    Recommendations for Other Services       Precautions / Restrictions Precautions Precautions: Knee Precaution Booklet Issued: No Precaution Comments: Verbally reviewed knee precautions. Restrictions Weight Bearing Restrictions: Yes LLE Weight Bearing: Weight bearing as tolerated      Mobility  Bed Mobility Overal bed mobility: Needs Assistance Bed Mobility: Supine to Sit     Supine to sit: Min assist     General bed mobility comments: Min A for LLE assist. Increased time required secondary to pain    Transfers Overall transfer level: Needs assistance Equipment used: Rolling walker (2 wheeled) Transfers: Sit to/from [facility] Sit to Stand: Min assist;Min guard Stand pivot transfers: Min assist;Min guard       General transfer comment: Initially requiring min guard A to stand and transfer to [facility] using RW. When on [facility], pt reports feeling hot, shaky and lightheaded; required min A to stand and transfer to chair. Checked BP, RN checked blood sugar and was all WFL.  Ambulation/Gait                Stairs            Wheelchair Mobility    Modified Rankin (Stroke Patients Only)       Balance Overall  balance assessment: Needs assistance Sitting-balance support: No upper extremity supported;Feet supported Sitting balance-Leahy Scale: Good     Standing balance support: Bilateral upper extremity supported Standing balance-Leahy Scale: Poor Standing balance comment: Reliant on BUE support                             Pertinent Vitals/Pain Pain Assessment: Faces Faces Pain Scale: Hurts whole lot Pain Location: L knee Pain Descriptors / Indicators: Aching;Operative site guarding Pain Intervention(s): Limited activity within patient's tolerance;Monitored during session;Repositioned    Home Living Family/patient expects to be discharged to:: Private residence Living Arrangements: Spouse/significant other Available Help at Discharge: Family Type of Home: House Home Access: Stairs to enter Entrance Stairs-Rails: Right Entrance Stairs-Number of Steps: 1 Home Layout: One level Home Equipment: Environmental consultant - 2 wheels;Shower seat      Prior Function Level of Independence: Independent               Hand Dominance        Extremity/Trunk Assessment   Upper Extremity Assessment Upper Extremity Assessment: Overall WFL for tasks assessed    Lower Extremity Assessment Lower Extremity Assessment: LLE deficits/detail LLE Deficits / Details: deficits consistent with post op pain and weakness.    Cervical / Trunk Assessment Cervical / Trunk Assessment: Normal  Communication   Communication: No difficulties  Cognition Arousal/Alertness: Awake/alert Behavior During Therapy: WFL for tasks assessed/performed Overall Cognitive Status: Within Functional Limits for tasks assessed  General Comments      Exercises     Assessment/Plan    PT Assessment Patient needs continued PT services  PT Problem List Decreased strength;Decreased range of motion;Decreased activity tolerance;Decreased balance;Decreased  mobility;Decreased knowledge of use of DME;Decreased knowledge of precautions;Pain       PT Treatment Interventions DME instruction;Gait training;Stair training;Functional mobility training;Therapeutic activities;Therapeutic exercise;Balance training;Patient/family education    PT Goals (Current goals can be found in the Care Plan section)  Acute Rehab PT Goals Patient Stated Goal: to feel better and go home PT Goal Formulation: With patient Time For Goal Achievement: 10/05/20 Potential to Achieve Goals: Good    Frequency 7X/week   Barriers to discharge        Co-evaluation               AM-PAC PT "6 Clicks" Mobility  Outcome Measure Help needed turning from your back to your side while in a flat bed without using bedrails?: A Little Help needed moving from lying on your back to sitting on the side of a flat bed without using bedrails?: A Little Help needed moving to and from a bed to a chair (including a wheelchair)?: A Little Help needed standing up from a chair using your arms (e.g., wheelchair or bedside chair)?: A Little Help needed to walk in hospital room?: A Little Help needed climbing 3-5 steps with a railing? : A Lot 6 Click Score: 17    End of Session Equipment Utilized During Treatment: Gait belt Activity Tolerance: Patient limited by pain;Treatment limited secondary to medical complications (Comment) (lightheadedness) Patient left: in chair;with call bell/phone within reach Nurse Communication: Mobility status;Other (comment) (pt with lightheadedness) PT Visit Diagnosis: Other abnormalities of gait and mobility (R26.89);Pain Pain - Right/Left: Left Pain - part of body: Knee    Time: 6433-2951 PT Time Calculation (min) (ACUTE ONLY): 30 min   Charges:   PT Evaluation $PT Eval Low Complexity: 1 Low PT Treatments $Therapeutic Activity: 8-22 mins        Lou Miner, DPT  Acute Rehabilitation Services  Pager: [phone] Office: [phone]   Rudean Hitt 09/21/2020, 4:48 PM

## 2020-09-22 ENCOUNTER — Other Ambulatory Visit: Payer: Self-pay

## 2020-09-22 ENCOUNTER — Encounter (HOSPITAL_COMMUNITY): Payer: Self-pay | Admitting: Orthopaedic Surgery

## 2020-09-22 LAB — CBC
HCT: 28 % — ABNORMAL LOW (ref 36.0–46.0)
Hemoglobin: 8.9 g/dL — ABNORMAL LOW (ref 12.0–15.0)
MCH: 27.6 pg (ref 26.0–34.0)
MCHC: 31.8 g/dL (ref 30.0–36.0)
MCV: 86.7 fL (ref 80.0–100.0)
Platelets: 226 10*3/uL (ref 150–400)
RBC: 3.23 MIL/uL — ABNORMAL LOW (ref 3.87–5.11)
RDW: 13.8 % (ref 11.5–15.5)
WBC: 11.3 10*3/uL — ABNORMAL HIGH (ref 4.0–10.5)
nRBC: 0 % (ref 0.0–0.2)

## 2020-09-22 LAB — BASIC METABOLIC PANEL
Anion gap: 11 (ref 5–15)
BUN: 12 mg/dL (ref 8–23)
CO2: 23 mmol/L (ref 22–32)
Calcium: 8.8 mg/dL — ABNORMAL LOW (ref 8.9–10.3)
Chloride: 98 mmol/L (ref 98–111)
Creatinine, Ser: 1.04 mg/dL — ABNORMAL HIGH (ref 0.44–1.00)
GFR, Estimated: 59 mL/min — ABNORMAL LOW (ref >60)
Glucose, Bld: 166 mg/dL — ABNORMAL HIGH (ref 70–99)
Potassium: 4.1 mmol/L (ref 3.5–5.1)
Sodium: 132 mmol/L — ABNORMAL LOW (ref 135–145)

## 2020-09-22 LAB — GLUCOSE, CAPILLARY: Glucose-Capillary: 194 mg/dL — ABNORMAL HIGH (ref 70–99)

## 2020-09-22 MED ORDER — MORPHINE SULFATE (PF) 2 MG/ML IV SOLN
2.0000 mg | INTRAVENOUS | Status: DC | PRN
  Administered 2020-09-22 – 2020-09-23 (×3): 2 mg via INTRAVENOUS
  Filled 2020-09-22 (×3): qty 1

## 2020-09-22 NOTE — Discharge Instructions (Signed)

## 2020-09-22 NOTE — TOC Initial Note (Signed)
Transition of Care [facility]) - Initial/Assessment Note    Patient Details  Name: Jodi Williams MRN: [id number] Date of Birth: 1950/11/10  Transition of [facility]) CM/SW Contact:    Sharin Mons, RN Phone Number: 09/22/2020, 3:25 PM  Clinical Narrative:                    - left total knee arthroplasty, 09/21/2020  From home with husband. PTA independent with ADL's. States already has rolling walker and shower chair @ home. States would like a 3 in1/bsc to go home with @ d/c. Pt setup with [facility] preoperatively for PT services and accepted. Pt without Rx med concerns. Post hospital f/u noted on AVS. Pt states has transportation to home when d/c.  Expected Discharge Plan: [place] Barriers to Discharge: Continued Medical Work up   Patient Goals and CMS Choice     Choice offered to / list presented to : Patient  Expected Discharge Plan and Services Expected Discharge Plan: [place]   Discharge Planning Services: CM Consult   Living arrangements for the past 2 months: Single Family Home                 DME Arranged: 3-N-1 DME Agency: [employer] Date DME Agency Contacted: 09/22/20 Time DME Agency Contacted: [phone] Representative spoke with at DME Agency: Nurse to provide prior to d/c from floor stock HH Arranged: PT [place]: Other - See comment ([place]) Date [place]: 09/22/20 Time HH Agency Contacted: 6 Representative spoke with at [place]: Erline Levine  Prior Living Arrangements/Services Living arrangements for the past 2 months: [place] Lives with:: Spouse Patient language and need for interpreter reviewed:: Yes Do you feel safe going back to the place where you live?: Yes      Need for Family Participation in Patient Care: Yes (Comment) Care giver support system in place?: Yes (comment)   Criminal Activity/Legal Involvement Pertinent to Current Situation/Hospitalization: No -  Comment as needed  Activities of Daily Living Home Assistive Devices/Equipment: Eyeglasses,Walker (specify type),Shower chair without back,Hand-held shower hose,Grab bars in shower,CBG Meter,Blood pressure cuff ADL Screening (condition at time of admission) Patient's cognitive ability adequate to safely complete daily activities?: Yes Is the patient deaf or have difficulty hearing?: No Does the patient have difficulty seeing, even when wearing glasses/contacts?: No Does the patient have difficulty concentrating, remembering, or making decisions?: No Patient able to express need for assistance with ADLs?: Yes Does the patient have difficulty dressing or bathing?: No Independently performs ADLs?: Yes (appropriate for developmental age) Does the patient have difficulty walking or climbing stairs?: Yes Weakness of Legs: Left Weakness of Arms/Hands: None  Permission Sought/Granted                  Emotional Assessment Appearance:: Appears stated age Attitude/Demeanor/Rapport: Gracious Affect (typically observed): Accepting Orientation: : Oriented to Self,Oriented to Place,Oriented to  Time,Oriented to Situation Alcohol / Substance Use: Not Applicable Psych Involvement: No (comment)  Admission diagnosis:  Status post revision of total replacement of left knee [Z96.652] Patient Active Problem List   Diagnosis Date Noted  . 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 Status post revision of total replacement of left knee 09/21/2020  . History of partial knee replacement 11/03/2017  . Chronic pain of left knee 11/03/2017  . Chronic migraine 03/31/2017  . Preop cardiovascular exam 07/12/2015  . Dyspnea 07/12/2015  . Chest pain  07/12/2015  . Restless legs 05/24/2015  . Diabetes ([place]) 08/24/2013  . Other and unspecified hyperlipidemia 09/07/2012  . HTN (hypertension) 04/12/2012  . OA (osteoarthritis) of knee 04/12/2012  . 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  Depression with anxiety 04/12/2012  . H/O gastric ulcer 04/12/2012  . ALLERGIC RHINITIS WITH CONJUNCTIVITIS 09/08/2007  . ASTHMA 07/20/2007   PCP:  Darreld Mclean, MD Pharmacy:   [facility], [address], [address], [address] [place] [address] Phone: [phone] Fax: [place], [address] [place] [address] Phone: [phone] Fax: [place], [place] [address] [place] [place] [address] Phone: [phone] Fax: [place], [place], [address] [place] [address] Phone: [phone] Fax: [phone]     Social Determinants of Health (SDOH) Interventions    Readmission Risk Interventions No flowsheet data found.

## 2020-09-22 NOTE — Progress Notes (Signed)
Physical Therapy Treatment Patient Details Name: Jodi Williams MRN: [id number] DOB: Aug 05, 1950 Today's Date: 09/22/2020    History of Present Illness Pt is a 70 y/o female s/p L total knee revision. PMH includes bilateral partial knee replacements, DM, HTN, and COPD.    PT Comments    Pt supine in bed on arrival.  She remains tachycardic at baseline and HR elevates with activity, she also remains orthostatic.  Informed nursing and MD.  Pt continues to benefit from follow up to review stair training before returning home.    Orthostatic Vitals: 143/65, HR 121 resting supine in bed. 126/62, HR 134 seated. 114/83, HR 138 standing. 124/65,HR 140 after 3 min standing.  Reports mild dizziness.      Follow Up Recommendations  Follow surgeon's recommendation for DC plan and follow-up therapies     Equipment Recommendations  3in1 (PT)    Recommendations for Other Services       Precautions / Restrictions Precautions Precautions: Knee Precaution Booklet Issued: No Precaution Comments: Verbally reviewed knee precautions. Restrictions Weight Bearing Restrictions: Yes LLE Weight Bearing: Weight bearing as tolerated    Mobility  Bed Mobility Overal bed mobility: Needs Assistance Bed Mobility: Supine to Sit;Sit to Supine     Supine to sit: Supervision Sit to supine: Supervision   General bed mobility comments: Increased time and effort to move to edge of bed.  Utilized gt belt as a leg lifter to advance to edge of bed.    Transfers Overall transfer level: Needs assistance Equipment used: Rolling walker (2 wheeled) Transfers: Sit to/from Stand Sit to Stand: Supervision         General transfer comment: Cues for hand placement to push from seated surface.  Ambulation/Gait Ambulation/Gait assistance: Min assist Gait Distance (Feet): 80 Feet Assistive device: Rolling walker (2 wheeled) Gait Pattern/deviations: Decreased stance time - left;Trunk flexed     General Gait  Details: Cues for sequencing and RW position.  Cues to push through B UEs to off load LLE in stance phase.  LImited gt distance due to feeling shaky.  + orthostasis but did level out after standing 3 min before advancing gt training.   Stairs             Wheelchair Mobility    Modified Rankin (Stroke Patients Only)       Balance Overall balance assessment: Needs assistance Sitting-balance support: No upper extremity supported;Feet supported Sitting balance-Leahy Scale: Good       Standing balance-Leahy Scale: Poor Standing balance comment: Reliant on BUE support                            Cognition Arousal/Alertness: Awake/alert Behavior During Therapy: WFL for tasks assessed/performed Overall Cognitive Status: Within Functional Limits for tasks assessed                                        Exercises Total Joint Exercises Goniometric ROM: 70 degrees flexion in L knee.    General Comments        Pertinent Vitals/Pain Pain Assessment: Faces Faces Pain Scale: Hurts whole lot Pain Location: L knee Pain Descriptors / Indicators: Aching;Operative site guarding Pain Intervention(s): Monitored during session;Repositioned    Home Living                      Prior Function  PT Goals (current goals can now be found in the care plan section) Acute Rehab PT Goals Patient Stated Goal: to feel better and go home Potential to Achieve Goals: Good Progress towards PT goals: Progressing toward goals    Frequency    7X/week      PT Plan Current plan remains appropriate    Co-evaluation              AM-PAC PT "6 Clicks" Mobility   Outcome Measure  Help needed turning from your back to your side while in a flat bed without using bedrails?: A Little Help needed moving from lying on your back to sitting on the side of a flat bed without using bedrails?: A Little Help needed moving to and from a bed to a  chair (including a wheelchair)?: A Little Help needed standing up from a chair using your arms (e.g., wheelchair or bedside chair)?: A Little Help needed to walk in hospital room?: A Little Help needed climbing 3-5 steps with a railing? : A Little 6 Click Score: 18    End of Session Equipment Utilized During Treatment: Gait belt Activity Tolerance: Patient tolerated treatment well Patient left: with call bell/phone within reach;in bed;with bed alarm set Nurse Communication: Mobility status (tachycardia and +orthostasis) PT Visit Diagnosis: Other abnormalities of gait and mobility (R26.89);Pain Pain - Right/Left: Left Pain - part of body: Knee     Time: 1194-1740 PT Time Calculation (min) (ACUTE ONLY): 36 min  Charges:  $Gait Training: 8-22 mins $Therapeutic Activity: 8-22 mins                     Erasmo Leventhal , PTA Acute Rehabilitation Services Pager [phone] Office [place] 09/22/2020, 5:39 PM

## 2020-09-22 NOTE — Op Note (Signed)
Jodi Williams, [place] MEDICAL RECORD NO: [id number] ACCOUNT NO: [id number] DATE OF BIRTH: 10-Sep-1950 FACILITY: [facility] LOCATION: [facility] PHYSICIAN: Lind Guest. 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  PROCEDURE:  Conversion of left total knee arthroplasty to a left total knee arthroplasty.  IMPLANTS:  DePuy Attune total knee system with size 3 left PS femur, size 3 RP tibial tray, a 14 mm RP PS polyethylene insert, size 35 patellar dome.  SURGEON:  Jean Rosenthal, M.D.  ASSISTANT:  Erskine Emery, PA-C.  ANESTHESIA:   1.  Left lower extremity adductor canal block. 2.  General.  ESTIMATED BLOOD LOSS:  Less than 100 mL  TOURNIQUET TIME:  Less than an hour and a half.  Antibiotics 900 mg IV clindamycin.  COMPLICATIONS:  None.  INDICATIONS:  The patient is a 70 year old female who has bilateral unicompartmental knee replacements in the medial compartment of both knees.  This was done by one of my colleagues in town around 2016.  She came to see me in 2019 with continued left  knee pain.  At that time, I believe radiographic studies were negative for any type of loosening and she never had much swelling, just a lot of medial pain.  This kept bothering her to the point where we saw her again over the last year and we obtained a  new 3-phase bone scan that showed significant radiotracer uptake in the medial femoral condyle on both the right and left knees, but more so on the left painful knee and some left knee tibial plateau.  Given the failure of time, activity modification,  now effusions of her knee and the radiographic findings, we felt that it was appropriate to proceed with a revision arthroplasty of the knee.  We had a long and thorough discussion about the risks  of this type of surgery including the risk of acute blood  loss anemia, nerve or vessel injury, fracture, infection, DVT, implant failure and skin and soft tissue issues.  We talked about our goals being hopefully decreased pain, improve mobility and overall improved quality of life.  DESCRIPTION OF PROCEDURE:  After informed consent was obtained, appropriate left knee was marked.  An adductor canal block was obtained in the left lower extremity in the holding room.  She was then brought to the operating room and placed supine on the  operating table.  General anesthesia was then obtained.  A nonsterile tourniquet was placed around her upper left thigh and her left thigh, knee, leg, ankle and foot were prepped and draped with DuraPrep and sterile drapes.  A timeout was called.  She  was identified correct patient, correct left knee.  We then went through her previous incision, which was a midline incision.  Next, we wrapped the leg with an Esmarch and tourniquet inflated to 300 mm of pressure.  We then made a direct midline incision  over the patella and carried this proximally and distally.  This was actually through her previous incision.  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  With the knee in a flexed position the femoral component came right off without any effort at all.  It  was grossly loose.  The tibial component had only minimal  loosening to it, but it still came off without having to remove any extra bone.  We then removed remnants of ACL, PCL, medial and lateral meniscus and osteophytes around the knee.  There was moderate arthritis of the patellofemoral joint and lateral  compartment of the knee.  With the knee in a flexed position, we then put our extramedullary guide, cutting guide for making our proximal  tibial cut level with her previous cut.  We corrected her varus and valgus and slope of 3 degrees.  We made that cut  without difficulty.  We then used an intramedullary guide for making our distal femoral cut, setting this for left knee at 5 degrees externally rotated.  We set this for a 10 mm distal femoral cut and made that cut without difficulty, and brought the  knee back down to full extension.  We had to go all the way up to a 14 mm extension block and to have her full and stable extension.  We then backed to the femur and put our femoral sizing guide based off the epicondylar axis and chose a size 3 femur.   We put a 4-in-1 cutting block for size 3 femur, made our anterior and posterior cuts, followed by our chamfer cuts.  We then made our femoral box cut.  Attention was then turned back to the tibia, we chose a size 3 tibial tray for coverage, setting the  rotation of the tibial tubercle and the femur.  We made our keel punch and drill hole off of that.  We then trialled a size 3 tibia followed by the size 3 left femur and a 14 mm fixed bearing polyethylene insert.  We were pleased with range of motion and  stability with varus and valgus stressing and anterior and posterior stressing as well as range of motion.  We then made our patellar cut and drilled three holes for a size 35 patellar button.  I put the knee through several cycles of motion with the  trial implants in place.  We then removed all trial implants and irrigated the knee with normal saline solution using pulsatile lavage and 3 liters of normal saline solution.  We dried the knee real well and then with the knee in a flexed position mixed  our cement and then cemented our real DePuy Attune tibial tray, size 3, followed by a real size 3 left femur.  We placed our real 14 mm thickness rotating platform polyethylene insert and cemented our patellar button.  I then held the knee compressed in  a fully extended position to allow the cement  to harden.  Once it hardened, we put the knee through several cycles of motion.  We were pleased with motion again and stability.  We then let the tourniquet down and hemostasis obtained with electrocautery.   We closed the arthrotomy with interrupted #1 Vicryl suture followed by 0 Vicryl to close the deep tissue and 2-0 Vicryl to close the subcutaneous tissue.  The skin was reapproximated with staples.  A well-padded sterile dressing was applied.  She was  taken to recovery room in stable condition with all final counts being correct.  No complications noted.  Of note, Benita Stabile, PA-C assisted during the entire case and assistance was crucial for facilitating all aspects of this case.     Elián.Darby D: 09/21/2020 10:13:01 am T: 09/22/2020 1:41:00 am  JOB: [id number]/ [id number]

## 2020-09-22 NOTE — Progress Notes (Signed)
Pt heart rate staying tachy throughout the day. PT worked with her and stated it got up to 140 while standing for several minutes. It ranges from 110-140.

## 2020-09-22 NOTE — Progress Notes (Signed)
Physical Therapy Treatment Patient Details Name: Jodi Williams MRN: [id number] DOB: 03-13-1951 Today's Date: 09/22/2020    History of Present Illness Pt is a 70 y/o female s/p L total knee revision. PMH includes bilateral partial knee replacements, DM, HTN, and COPD.    PT Comments    Pt supine in bed on arrival this session.  Pt performed LE exercises with active assisted ROM using gt belt performed orthostatic vitals as she was dizzy last session.  Pt reports feeling shaky with a headache but denies dizziness.    Orthostatic vitals: Lying: 145/62 ( 118 bpm) Sitting:127/62 (122 bpm) Standing:115/61 (129 bpm) Standing after 3 min: 121/69 (128 bpm)   Follow Up Recommendations  Follow surgeon's recommendation for DC plan and follow-up therapies     Equipment Recommendations  3in1 (PT)    Recommendations for Other Services       Precautions / Restrictions Precautions Precautions: Knee Precaution Booklet Issued: No Precaution Comments: Verbally reviewed knee precautions. Restrictions Weight Bearing Restrictions: Yes LLE Weight Bearing: Weight bearing as tolerated    Mobility  Bed Mobility Overal bed mobility: Needs Assistance Bed Mobility: Supine to Sit;Sit to Supine     Supine to sit: Supervision     General bed mobility comments: Increased time and effort to move to edge of bed.  Utilized gt belt as a leg lifter to advance to edge of bed.    Transfers Overall transfer level: Needs assistance Equipment used: Rolling walker (2 wheeled) Transfers: Sit to/from Stand Sit to Stand: Supervision         General transfer comment: Cues for hand placement to push from seated surface.  Ambulation/Gait Ambulation/Gait assistance: Min assist Gait Distance (Feet): 12 Feet Assistive device: Rolling walker (2 wheeled) Gait Pattern/deviations: Decreased stance time - left;Trunk flexed     General Gait Details: Cues for sequencing and RW position.  Cues to push through B  UEs to off load LLE in stance phase.  LImited gt distance due to feeling shaky.  + orthostasis but did level out after standing 3 min before advancing gt training.   Stairs             Wheelchair Mobility    Modified Rankin (Stroke Patients Only)       Balance Overall balance assessment: Needs assistance   Sitting balance-Leahy Scale: Good       Standing balance-Leahy Scale: Poor Standing balance comment: Reliant on BUE support                            Cognition Arousal/Alertness: Awake/alert Behavior During Therapy: WFL for tasks assessed/performed Overall Cognitive Status: Within Functional Limits for tasks assessed                                        Exercises Total Joint Exercises Ankle Circles/Pumps: AROM;Both;Supine;20 reps Quad Sets: AROM;Left;10 reps;Supine Heel Slides: AAROM;Left;10 reps;Supine Hip ABduction/ADduction: AAROM;Left;10 reps;Supine Straight Leg Raises: AAROM;Left;10 reps;Supine Goniometric ROM: will assess this pm    General Comments        Pertinent Vitals/Pain Pain Assessment: Faces Faces Pain Scale: Hurts whole lot Pain Location: L knee Pain Descriptors / Indicators: Aching;Operative site guarding Pain Intervention(s): Monitored during session;Repositioned    Home Living                      Prior  Function            PT Goals (current goals can now be found in the care plan section) Acute Rehab PT Goals Potential to Achieve Goals: Good Progress towards PT goals: Progressing toward goals    Frequency    7X/week      PT Plan      Co-evaluation              AM-PAC PT "6 Clicks" Mobility   Outcome Measure  Help needed turning from your back to your side while in a flat bed without using bedrails?: A Little Help needed moving from lying on your back to sitting on the side of a flat bed without using bedrails?: A Little Help needed moving to and from a bed to a chair  (including a wheelchair)?: A Little Help needed standing up from a chair using your arms (e.g., wheelchair or bedside chair)?: A Little Help needed to walk in hospital room?: A Little Help needed climbing 3-5 steps with a railing? : A Little 6 Click Score: 18    End of Session Equipment Utilized During Treatment: Gait belt Activity Tolerance: Patient limited by pain;Treatment limited secondary to medical complications (Comment) (feeling shaky, performed orthostatics and checked her sugar) Patient left: in chair;with call bell/phone within reach;with chair alarm set Nurse Communication: Mobility status (pt reports feeling shaky) PT Visit Diagnosis: Other abnormalities of gait and mobility (R26.89);Pain Pain - Right/Left: Left Pain - part of body: Knee     Time: 0928-0959 PT Time Calculation (min) (ACUTE ONLY): 31 min  Charges:  $Gait Training: 8-22 mins $Therapeutic Exercise: 8-22 mins                     Erasmo Leventhal , PTA Acute Rehabilitation Services Pager [phone] Office [phone]     Pranay Hilbun Eli Hose 09/22/2020, 10:12 AM

## 2020-09-22 NOTE — Plan of Care (Signed)

## 2020-09-22 NOTE — Progress Notes (Signed)
Subjective: 1 Day Post-Op Procedure(s) (LRB): LEFT KNEE REVISION ARTHROPLASTY (Left) Patient reports pain as moderate.  Acute blood loss anemia from extensive surgery.  Did have a rash on her arm possibly due to dilaudid.  Objective: Vital signs in last 24 hours: Temp:  [97 F (36.1 C)-98.3 F (36.8 C)] 98.3 F (36.8 C) (05/13 0242) Pulse Rate:  [76-114] 100 (05/13 0242) Resp:  [9-22] 17 (05/13 0242) BP: (91-157)/(55-81) 133/80 (05/13 0242) SpO2:  [94 %-100 %] 94 % (05/13 0242)  Intake/Output from previous day: 05/12 0701 - 05/13 0700 In: 2475.1 [P.O.:540; I.V.:1835.1; IV Piggyback:100] Out: 50 [Blood:50] Intake/Output this shift: Total I/O In: 886.9 [P.O.:240; I.V.:646.9] Out: -   Recent Labs    09/19/20 0827 09/22/20 0251  HGB 12.5 8.9*   Recent Labs    09/19/20 0827 09/22/20 0251  WBC 7.5 11.3*  RBC 4.66 3.23*  HCT 40.2 28.0*  PLT 239 226   Recent Labs    09/19/20 0827 09/22/20 0251  NA 140 132*  K 4.1 4.1  CL 105 98  CO2 26 23  BUN 10 12  CREATININE 0.93 1.04*  GLUCOSE 166* 166*  CALCIUM 9.9 8.8*   No results for input(s): LABPT, INR in the last 72 hours.  Sensation intact distally Intact pulses distally Dorsiflexion/Plantar flexion intact Incision: scant drainage Compartment soft   Assessment/Plan: 1 Day Post-Op Procedure(s) (LRB): LEFT KNEE REVISION ARTHROPLASTY (Left) Up with therapy      Mcarthur Rossetti 09/22/2020, 6:47 AM

## 2020-09-22 NOTE — Progress Notes (Signed)
Patient is alert, elevated heart rate, provider advised to treat pain. Patient in no acute distress.  09/22/20 2040  Assess: MEWS Score  Temp 99.3 F (37.4 C)  BP (!) 150/69  Pulse Rate (!) 136 (RN notified)  Resp 18  Level of Consciousness Alert  SpO2 92 %  O2 Device Room Air  Assess: MEWS Score  MEWS Temp 0  MEWS Systolic 0  MEWS Pulse 3  MEWS RR 0  MEWS LOC 0  MEWS Score 3  MEWS Score Color Yellow  Assess: if the MEWS score is Yellow or Red  Were vital signs taken at a resting state? Yes  Focused Assessment No change from prior assessment  Early Detection of Sepsis Score *See Row Information* Low  Treat  MEWS Interventions Administered prn meds/treatments;Other (Comment) (provider notified)  Pain Scale 0-10  Pain Score 5  Pain Type Acute pain  Pain Location Knee  Pain Orientation Left  Pain Descriptors / Indicators Aching  Pain Frequency Constant  Pain Onset On-going  Patients Stated Pain Goal 3  Pain Intervention(s) Repositioned;Cold applied  Take Vital Signs  Increase Vital Sign Frequency  Yellow: Q 2hr X 2 then Q 4hr X 2, if remains yellow, continue Q 4hrs  Escalate  MEWS: Escalate Yellow: discuss with charge nurse/RN and consider discussing with provider and RRT  Notify: Charge Nurse/RN  Name of Charge Nurse/RN Notified Daune Perch  Date Charge Nurse/RN Notified 09/22/20  Time Charge Nurse/RN Notified 2043  Notify: Provider  Provider Name/Title Dr. Flavia Shipper  Date Provider Notified 09/22/20  Time Provider Notified 2050  Notification Type Page  Notification Reason Other (Comment) (elevated HR)  Provider response No new orders (stated to treat pain and monitor)  Date of Provider Response 09/22/20  Time of Provider Response 2100  Notify: Rapid Response  Name of Rapid Response RN Notified n/a  Document  Patient Outcome Stabilized after interventions  Progress note created (see row info) Yes

## 2020-09-22 NOTE — Anesthesia Postprocedure Evaluation (Signed)
Anesthesia Post Note  Patient: Jodi Williams  Procedure(s) Performed: LEFT KNEE REVISION ARTHROPLASTY (Left Knee)     Patient location during evaluation: PACU Anesthesia Type: General Level of consciousness: awake Pain management: pain level controlled Vital Signs Assessment: post-procedure vital signs reviewed and stable Respiratory status: spontaneous breathing Cardiovascular status: stable Postop Assessment: no apparent nausea or vomiting Anesthetic complications: no   No complications documented.  Last Vitals:  Vitals:   09/21/20 1921 09/22/20 0242  BP: 135/71 133/80  Pulse: (!) 102 100  Resp: 18 17  Temp: 36.7 C 36.8 C  SpO2: 97% 94%    Last Pain:  Vitals:   09/22/20 0242  TempSrc: Oral  PainSc:                  Zana Biancardi

## 2020-09-23 ENCOUNTER — Inpatient Hospital Stay (HOSPITAL_COMMUNITY): Payer: Medicare Other

## 2020-09-23 LAB — BASIC METABOLIC PANEL
Anion gap: 9 (ref 5–15)
BUN: 9 mg/dL (ref 8–23)
CO2: 21 mmol/L — ABNORMAL LOW (ref 22–32)
Calcium: 8.3 mg/dL — ABNORMAL LOW (ref 8.9–10.3)
Chloride: 104 mmol/L (ref 98–111)
Creatinine, Ser: 1.08 mg/dL — ABNORMAL HIGH (ref 0.44–1.00)
GFR, Estimated: 56 mL/min — ABNORMAL LOW (ref >60)
Glucose, Bld: 183 mg/dL — ABNORMAL HIGH (ref 70–99)
Potassium: 3.7 mmol/L (ref 3.5–5.1)
Sodium: 134 mmol/L — ABNORMAL LOW (ref 135–145)

## 2020-09-23 LAB — CBC
HCT: 23.8 % — ABNORMAL LOW (ref 36.0–46.0)
HCT: 30.3 % — ABNORMAL LOW (ref 36.0–46.0)
Hemoglobin: 7.5 g/dL — ABNORMAL LOW (ref 12.0–15.0)
Hemoglobin: 10 g/dL — ABNORMAL LOW (ref 12.0–15.0)
MCH: 27.2 pg (ref 26.0–34.0)
MCH: 27.7 pg (ref 26.0–34.0)
MCHC: 31.5 g/dL (ref 30.0–36.0)
MCHC: 33 g/dL (ref 30.0–36.0)
MCV: 86.2 fL (ref 80.0–100.0)
MCV: 83.9 fL (ref 80.0–100.0)
Platelets: 168 10*3/uL (ref 150–400)
Platelets: 155 10*3/uL (ref 150–400)
RBC: 2.76 MIL/uL — ABNORMAL LOW (ref 3.87–5.11)
RBC: 3.61 MIL/uL — ABNORMAL LOW (ref 3.87–5.11)
RDW: 14.2 % (ref 11.5–15.5)
RDW: 14.4 % (ref 11.5–15.5)
WBC: 9.5 10*3/uL (ref 4.0–10.5)
WBC: 12.3 10*3/uL — ABNORMAL HIGH (ref 4.0–10.5)
nRBC: 0 % (ref 0.0–0.2)
nRBC: 0 % (ref 0.0–0.2)

## 2020-09-23 LAB — URINALYSIS, ROUTINE W REFLEX MICROSCOPIC
Bacteria, UA: NONE SEEN
Bilirubin Urine: NEGATIVE
Glucose, UA: 50 mg/dL — AB
Ketones, ur: 5 mg/dL — AB
Leukocytes,Ua: NEGATIVE
Nitrite: NEGATIVE
Protein, ur: 100 mg/dL — AB
Specific Gravity, Urine: 1.011 (ref 1.005–1.030)
pH: 5 (ref 5.0–8.0)

## 2020-09-23 LAB — PREPARE RBC (CROSSMATCH)

## 2020-09-23 MED ORDER — LACTATED RINGERS IV BOLUS
500.0000 mL | Freq: Once | INTRAVENOUS | Status: AC
  Administered 2020-09-23: 500 mL via INTRAVENOUS

## 2020-09-23 MED ORDER — DIPHENHYDRAMINE HCL 25 MG PO CAPS
25.0000 mg | ORAL_CAPSULE | Freq: Once | ORAL | Status: AC
Start: 2020-09-23 — End: 2020-09-23
  Administered 2020-09-23: 25 mg via ORAL
  Filled 2020-09-23: qty 1

## 2020-09-23 MED ORDER — ASPIRIN 81 MG PO CHEW: 81.0000 mg | CHEWABLE_TABLET | Freq: Two times a day (BID) | ORAL | 0 refills | Status: AC

## 2020-09-23 MED ORDER — FUROSEMIDE 10 MG/ML IJ SOLN
20.0000 mg | Freq: Once | INTRAMUSCULAR | Status: AC
  Administered 2020-09-23: 20 mg via INTRAVENOUS
  Filled 2020-09-23: qty 2

## 2020-09-23 MED ORDER — METHOCARBAMOL 500 MG PO TABS: 500.0000 mg | ORAL_TABLET | Freq: Four times a day (QID) | ORAL | 1 refills | Status: DC | PRN

## 2020-09-23 MED ORDER — ACETAMINOPHEN 325 MG PO TABS
650.0000 mg | ORAL_TABLET | Freq: Once | ORAL | Status: DC
  Filled 2020-09-23: qty 2

## 2020-09-23 MED ORDER — OXYCODONE HCL 5 MG PO TABS: 5.0000 mg | ORAL_TABLET | ORAL | 0 refills | Status: DC | PRN

## 2020-09-23 MED ORDER — SODIUM CHLORIDE 0.9% IV SOLUTION: Freq: Once | INTRAVENOUS | Status: DC

## 2020-09-23 NOTE — Progress Notes (Signed)
Dr. Louanne Skye returned call. Verbal orders were given with read back, and orders were put in.

## 2020-09-23 NOTE — Progress Notes (Signed)
Subjective: 2 Days Post-Op Procedure(s) (LRB): LEFT KNEE REVISION ARTHROPLASTY (Left) Patient reports pain as moderate.  She is experiencing symptomatic acute blood loss anemia with tachycardia and some lightheadedness.  My partner on-call appropriately ordered a transfusion.  I talked to the patient in length about this.  She is still highly motivated.  Objective: Vital signs in last 24 hours: Temp:  [98.2 F (36.8 C)-101.5 F (38.6 C)] 99.9 F (37.7 C) (05/14 1003) Pulse Rate:  [109-136] 115 (05/14 1003) Resp:  [16-20] 16 (05/14 1003) BP: (102-150)/(55-75) 149/68 (05/14 1003) SpO2:  [90 %-99 %] 97 % (05/14 1003)  Intake/Output from previous day: 05/13 0701 - 05/14 0700 In: 3144.9 [P.O.:960; I.V.:2184.9] Out: 1500 [Urine:1500] Intake/Output this shift: Total I/O In: 315 [Blood:315] Out: 2700 [Urine:2700]  Recent Labs    09/22/20 0251 09/23/20 0223  HGB 8.9* 7.5*   Recent Labs    09/22/20 0251 09/23/20 0223  WBC 11.3* 9.5  RBC 3.23* 2.76*  HCT 28.0* 23.8*  PLT 226 168   Recent Labs    09/22/20 0251 09/23/20 0223  NA 132* 134*  K 4.1 3.7  CL 98 104  CO2 23 21*  BUN 12 9  CREATININE 1.04* 1.08*  GLUCOSE 166* 183*  CALCIUM 8.8* 8.3*   No results for input(s): LABPT, INR in the last 72 hours.  Sensation intact distally Intact pulses distally Dorsiflexion/Plantar flexion intact Incision: scant drainage No cellulitis present Compartment soft   Assessment/Plan: 2 Days Post-Op Procedure(s) (LRB): LEFT KNEE REVISION ARTHROPLASTY (Left) Up with therapy Can discharged tomorrow versus Monday if improving clinically.     Mcarthur Rossetti 09/23/2020, 10:23 AM

## 2020-09-23 NOTE — Progress Notes (Signed)
Message was left through on call messaging system about pt vitals and blood-work results. Waiting on a return call. Dr. Ninfa Linden was messaged on secure chat also.

## 2020-09-23 NOTE — Plan of Care (Signed)

## 2020-09-23 NOTE — Progress Notes (Signed)
Physical Therapy Treatment Patient Details Name: Jodi Williams MRN: [id number] DOB: Oct 06, 1950 Today's Date: 09/23/2020    History of Present Illness Pt is a 70 y/o female s/p L total knee revision. PMH includes bilateral partial knee replacements, DM, HTN, and COPD.    PT Comments    Pt was seen for mobility on RW after being unable to do AM tx.  Pt has been transfused and feels better, and after walking did there ex on chair.  Iced the knee after visit, and note her L knee is -10 ext and 75 flexion.  Follow for goals of acute PT.   Follow Up Recommendations  Follow surgeon's recommendation for DC plan and follow-up therapies     Equipment Recommendations  3in1 (PT)    Recommendations for Other Services       Precautions / Restrictions Precautions Precautions: Knee Precaution Comments: Verbally reviewed knee precautions. Restrictions Weight Bearing Restrictions: Yes LLE Weight Bearing: Weight bearing as tolerated    Mobility  Bed Mobility Overal bed mobility: Needs Assistance Bed Mobility: Supine to Sit     Supine to sit: Min guard     General bed mobility comments: used bed rail    Transfers Overall transfer level: Needs assistance Equipment used: Rolling walker (2 wheeled) Transfers: Sit to/from Stand Sit to Stand: Min guard Stand pivot transfers: Min guard       General transfer comment: Cues for hand placement to push from seated surface.  Ambulation/Gait Ambulation/Gait assistance: Min guard Gait Distance (Feet): 50 Feet Assistive device: Rolling walker (2 wheeled) Gait Pattern/deviations: Wide base of support;Decreased step length - right;Decreased stance time - left Gait velocity: reduced Gait velocity interpretation: <1.31 ft/sec, indicative of household ambulator General Gait Details: reminders for controlling posture and distance from walker   Stairs             Wheelchair Mobility    Modified Rankin (Stroke Patients Only)        Balance Overall balance assessment: Needs assistance Sitting-balance support: Feet supported Sitting balance-Leahy Scale: Good       Standing balance-Leahy Scale: Poor                              Cognition Arousal/Alertness: Awake/alert Behavior During Therapy: WFL for tasks assessed/performed Overall Cognitive Status: Within Functional Limits for tasks assessed                                        Exercises Total Joint Exercises Ankle Circles/Pumps: AROM;5 reps Quad Sets: AROM;10 reps Hip ABduction/ADduction: AAROM;10 reps Straight Leg Raises: AAROM;10 reps General Exercises - Lower Extremity Hip ABduction/ADduction: AAROM;10 reps    General Comments General comments (skin integrity, edema, etc.): pt is unstable on LLE but with RW is unloading knee to increase stability      Pertinent Vitals/Pain Pain Assessment: Faces Faces Pain Scale: Hurts little more Pain Location: L knee Pain Descriptors / Indicators: Operative site guarding Pain Intervention(s): Ice applied;Monitored during session    Home Living                      Prior Function            PT Goals (current goals can now be found in the care plan section) Acute Rehab PT Goals Patient Stated Goal: to feel better and go  home Progress towards PT goals: Progressing toward goals    Frequency    7X/week      PT Plan Current plan remains appropriate    Co-evaluation              AM-PAC PT "6 Clicks" Mobility   Outcome Measure  Help needed turning from your back to your side while in a flat bed without using bedrails?: A Little Help needed moving from lying on your back to sitting on the side of a flat bed without using bedrails?: A Little Help needed moving to and from a bed to a chair (including a wheelchair)?: A Little Help needed standing up from a chair using your arms (e.g., wheelchair or bedside chair)?: A Little Help needed to walk in hospital  room?: A Little Help needed climbing 3-5 steps with a railing? : A Lot 6 Click Score: 17    End of Session Equipment Utilized During Treatment: Gait belt Activity Tolerance: Patient tolerated treatment well Patient left: with call bell/phone within reach;in bed;with bed alarm set Nurse Communication: Mobility status PT Visit Diagnosis: Other abnormalities of gait and mobility (R26.89);Pain Pain - Right/Left: Left Pain - part of body: Knee     Time: 6270-3500 PT Time Calculation (min) (ACUTE ONLY): 23 min  Charges:  $Gait Training: 8-22 mins $Therapeutic Exercise: 8-22 mins             Ramond Dial 09/23/2020, 8:47 PM Mee Hives, PT MS Acute Rehab Dept. Number: [place] [facility] [place]

## 2020-09-23 NOTE — Progress Notes (Signed)
Dr. Ninfa Linden has been made aware of pts temperature wavering throughout the day and getting elevated at times. No new orders placed. Will continue to monitor. Tylenol had been given earlier today. Dr just wanted to get the 2 units of blood in.

## 2020-09-23 NOTE — Plan of Care (Signed)
  Problem: Education: Goal: Knowledge of General Education information will improve Description: Including pain rating scale, medication(s)/side effects and non-pharmacologic comfort measures Outcome: Progressing   Problem: Health Behavior/Discharge Planning: Goal: Ability to manage health-related needs will improve Outcome: Progressing   Problem: Activity: Goal: Risk for activity intolerance will decrease Outcome: Progressing   Problem: Nutrition: Goal: Adequate nutrition will be maintained Outcome: Progressing   Problem: Pain Managment: Goal: General experience of comfort will improve Outcome: Progressing   Problem: Clinical Measurements: Goal: Ability to maintain clinical measurements within normal limits will improve Outcome: Not Progressing Goal: Will remain free from infection Outcome: Not Progressing

## 2020-09-23 NOTE — Progress Notes (Signed)
Nightshift nurse was taking vitals for blood administration this am and Pt triggered a red MEWS. MD has been aware of tachycardia since admission. Nightshift Dr on-call was paged by nightshift RN previously that night to be notified of elevated temperature. Vitals were retaken 20 minutes later and went back to yellow MEWS. No new orders were placed. Tylenol was given. Will continue to monitor.    09/23/20 0711  Assess: MEWS Score  Temp (!) 101.5 F (38.6 C)  BP 121/75  Pulse Rate (!) 124  SpO2 93 %  O2 Device Room Air  Assess: MEWS Score  MEWS Temp 2  MEWS Systolic 0  MEWS Pulse 2  MEWS RR 0  MEWS LOC 0  MEWS Score 4  MEWS Score Color Red  Assess: if the MEWS score is Yellow or Red  Were vital signs taken at a resting state? Yes  Focused Assessment No change from prior assessment  Early Detection of Sepsis Score *See Row Information* Low  MEWS guidelines implemented *See Row Information* No, vital signs rechecked  Treat  MEWS Interventions Administered prn meds/treatments  Pain Scale 0-10  Pain Score 3  Take Vital Signs  Increase Vital Sign Frequency  Yellow: Q 2hr X 2 then Q 4hr X 2, if remains yellow, continue Q 4hrs  Escalate  MEWS: Escalate Red: discuss with charge nurse/RN and provider, consider discussing with RRT  Notify: Charge Nurse/RN  Name of Charge Nurse/RN Notified Daune Perch  Date Charge Nurse/RN Notified 09/23/20  Notify: Provider  Provider Name/Title Dr. Louanne Skye  Date Provider Notified 09/23/20  Notification Type Page  Notification Reason Other (Comment) (Elevated temperature)  Provider response No new orders  Document  Patient Outcome Stabilized after interventions  Progress note created (see row info) Yes

## 2020-09-23 NOTE — Progress Notes (Addendum)
Patient ID: Jodi Williams, female   DOB: 05-May-1951, 70 y.o.   MRN: [id number] Tachycardia, given LR bolus 500 cc and IVR increased at 1AM. Low grade fever 101.2  Incentive spirometry ordered. This AM still tachycardia, lab 0500  with Hgb 7.5 down from 8.9 yesterday. Transfuse 2 units blood ordered. EKG tachycardia per RN

## 2020-09-23 NOTE — Progress Notes (Signed)
PT Cancellation Note  Patient Details Name: Jodi Williams MRN: [id number] DOB: [birth year]   Cancelled Treatment:    Reason Eval/Treat Not Completed: Medical issues which prohibited therapy.  Awaiting transfusion and will try later.   Ramond Dial 09/23/2020, 9:56 AM   Mee Hives, PT MS Acute Rehab Dept. Number: [place] [facility] [place]

## 2020-09-23 NOTE — Progress Notes (Signed)
Patient ID: Jodi Williams, female   DOB: Jan 30, 1951, 70 y.o.   MRN: [id number] See other note from a few minutes ago for details.  The patient has been running a fever.  This appears to be likely atelectasis.  She has been on strong pain medications and been more supine.  We encouraged her to be able to be up in a chair and using incentive spirometer.  Of note her calfs are soft.  I did send in pain medication to her local pharmacy

## 2020-09-24 LAB — CBC
HCT: 27 % — ABNORMAL LOW (ref 36.0–46.0)
Hemoglobin: 8.8 g/dL — ABNORMAL LOW (ref 12.0–15.0)
MCH: 27.5 pg (ref 26.0–34.0)
MCHC: 32.6 g/dL (ref 30.0–36.0)
MCV: 84.4 fL (ref 80.0–100.0)
Platelets: 154 10*3/uL (ref 150–400)
RBC: 3.2 MIL/uL — ABNORMAL LOW (ref 3.87–5.11)
RDW: 14.4 % (ref 11.5–15.5)
WBC: 10.9 10*3/uL — ABNORMAL HIGH (ref 4.0–10.5)
nRBC: 0 % (ref 0.0–0.2)

## 2020-09-24 LAB — TYPE AND SCREEN
ABO/RH(D): O POS
Antibody Screen: NEGATIVE
Unit division: 0
Unit division: 0

## 2020-09-24 LAB — BPAM RBC
Blood Product Expiration Date: 202206122359
Blood Product Expiration Date: 202206122359
ISSUE DATE / TIME: 202205140629
ISSUE DATE / TIME: 202205141011
Unit Type and Rh: 5100
Unit Type and Rh: 5100

## 2020-09-24 NOTE — Plan of Care (Signed)
  Problem: Education: Goal: Knowledge of General Education information will improve Description: Including pain rating scale, medication(s)/side effects and non-pharmacologic comfort measures Outcome: Progressing   Problem: Health Behavior/Discharge Planning: Goal: Ability to manage health-related needs will improve Outcome: Progressing   Problem: Clinical Measurements: Goal: Ability to maintain clinical measurements within normal limits will improve Outcome: Progressing   Problem: Activity: Goal: Risk for activity intolerance will decrease Outcome: Progressing   Problem: Nutrition: Goal: Adequate nutrition will be maintained Outcome: Progressing   Problem: Coping: Goal: Level of anxiety will decrease Outcome: Progressing   Problem: Elimination: Goal: Will not experience complications related to urinary retention Outcome: Progressing   Problem: Pain Managment: Goal: General experience of comfort will improve Outcome: Progressing   

## 2020-09-24 NOTE — Progress Notes (Signed)
Physical Therapy Treatment Patient Details Name: Jodi Williams MRN: [id number] DOB: [birth year] Today's Date: 09/24/2020    History of Present Illness Pt is a 70 y/o female s/p L total knee revision. PMH includes bilateral partial knee replacements, DM, HTN, and COPD.    PT Comments    Patient progressing well towards PT goals. Improved ambulation distance with Min guard assist and use of RW for support. Knee AROM 8-80 degrees. Encouraged having foam under heel to improve knee extension. Tolerated there ex well. Continues to report pain and stiffness. Recommend changing positions every hour as able. Noted to have some swelling in left foot/leg. Will continue to follow and progress as able.    Follow Up Recommendations  Follow surgeon's recommendation for DC plan and follow-up therapies     Equipment Recommendations  3in1 (PT)    Recommendations for Other Services       Precautions / Restrictions Precautions Precautions: Knee Precaution Booklet Issued: No Precaution Comments: Verbally reviewed knee precautions. Restrictions Weight Bearing Restrictions: Yes LLE Weight Bearing: Weight bearing as tolerated    Mobility  Bed Mobility Overal bed mobility: Needs Assistance Bed Mobility: Sit to Supine       Sit to supine: Supervision   General bed mobility comments: Able to bring LEs into bed without assist. HOB flat.    Transfers Overall transfer level: Needs assistance Equipment used: Rolling walker (2 wheeled) Transfers: Sit to/from Stand Sit to Stand: Min guard;Supervision         General transfer comment: Min guard progressing to supervision to stand from chair x1.  Ambulation/Gait Ambulation/Gait assistance: Min guard Gait Distance (Feet): 100 Feet Assistive device: Rolling walker (2 wheeled) Gait Pattern/deviations: Decreased step length - right;Decreased stance time - left;Step-to pattern;Step-through pattern Gait velocity: reduced   General Gait Details:  Slow, mildly unsteady gait with cues for knee extension during stance phase and knee flexion during swing; able to initiaite step through gait but not sustain. 1 standing rest break.   Stairs             Wheelchair Mobility    Modified Rankin (Stroke Patients Only)       Balance Overall balance assessment: Needs assistance Sitting-balance support: Feet supported;No upper extremity supported Sitting balance-Leahy Scale: Good     Standing balance support: During functional activity Standing balance-Leahy Scale: Poor Standing balance comment: Reliant on BUE support                            Cognition Arousal/Alertness: Awake/alert Behavior During Therapy: WFL for tasks assessed/performed Overall Cognitive Status: Within Functional Limits for tasks assessed                                        Exercises Total Joint Exercises Ankle Circles/Pumps: AROM;Both;10 reps;Seated Quad Sets: AROM;Both;5 reps;Seated Heel Slides: Left;10 reps;AROM;Seated Goniometric ROM: 8-80 degress knee AROM    General Comments        Pertinent Vitals/Pain Pain Assessment: 0-10 Pain Score: 6  Pain Location: Lft knee Pain Descriptors / Indicators: Operative site guarding;Aching;Sore Pain Intervention(s): Monitored during session;Repositioned;Limited activity within patient's tolerance;RN gave pain meds during session    Home Living                      Prior Function  PT Goals (current goals can now be found in the care plan section) Progress towards PT goals: Progressing toward goals    Frequency    7X/week      PT Plan Current plan remains appropriate    Co-evaluation              AM-PAC PT "6 Clicks" Mobility   Outcome Measure  Help needed turning from your back to your side while in a flat bed without using bedrails?: A Little Help needed moving from lying on your back to sitting on the side of a flat bed without  using bedrails?: A Little Help needed moving to and from a bed to a chair (including a wheelchair)?: A Little Help needed standing up from a chair using your arms (e.g., wheelchair or bedside chair)?: A Little Help needed to walk in hospital room?: A Little Help needed climbing 3-5 steps with a railing? : A Little 6 Click Score: 18    End of Session Equipment Utilized During Treatment: Gait belt Activity Tolerance: Patient tolerated treatment well Patient left: in bed;with call bell/phone within reach;with bed alarm set Nurse Communication: Mobility status PT Visit Diagnosis: Other abnormalities of gait and mobility (R26.89);Pain Pain - Right/Left: Left Pain - part of body: Knee     Time: 0820-0852 PT Time Calculation (min) (ACUTE ONLY): 32 min  Charges:  $Gait Training: 23-37 mins                     Marisa Severin, PT, DPT Acute Rehabilitation Services Pager [phone] Office [place] 09/24/2020, 11:53 AM

## 2020-09-24 NOTE — Plan of Care (Signed)

## 2020-09-24 NOTE — Progress Notes (Signed)
     Subjective: 3 Days Post-Op Procedure(s) (LRB): LEFT KNEE REVISION ARTHROPLASTY (Left) Awake, alert and oriented x 4. Nurse call last evening temperatures vacillating with high of 103. Blood cultures done as she was having chills. UA is negative, cultures were obtained. PCXR show atelectasis and seems to comfirm that she is likely generating fever from pulmonary cause, atelectasis.  Patient reports pain as moderate.    Objective:   VITALS:  Temp:  [97.9 F (36.6 C)-103 F (39.4 C)] 99.5 F (37.5 C) (05/15 0900) Pulse Rate:  [105-117] 105 (05/15 0746) Resp:  [14-18] 18 (05/15 0746) BP: (123-150)/(51-94) 123/73 (05/15 0746) SpO2:  [95 %-98 %] 95 % (05/15 0746)  Neurologically intact ABD soft Neurovascular intact Sensation intact distally Intact pulses distally Dorsiflexion/Plantar flexion intact Incision: dressing C/D/I   LABS Recent Labs    09/22/20 0251 09/23/20 0223 09/23/20 1704 09/24/20 0251  HGB 8.9* 7.5* 10.0* 8.8*  WBC 11.3* 9.5 12.3* 10.9*  PLT 226 168 155 154   Recent Labs    09/22/20 0251 09/23/20 0223  NA 132* 134*  K 4.1 3.7  CL 98 104  CO2 23 21*  BUN 12 9  CREATININE 1.04* 1.08*  GLUCOSE 166* 183*   No results for input(s): LABPT, INR in the last 72 hours.   Assessment/Plan: 3 Days Post-Op Procedure(s) (LRB): LEFT KNEE REVISION ARTHROPLASTY (Left)  Advance diet Up with therapy D/C IV fluids Plan for discharge tomorrow  She is not ready for discharge. Will monitory fever and hopefully she will be ready for discharge in the AM.   Basil Dess 09/24/2020, 12:27 PMPatient ID: Jodi Williams, female   DOB: 1950/06/19, 70 y.o.   MRN: [id number]

## 2020-09-25 LAB — URINE CULTURE

## 2020-09-25 NOTE — Care Plan (Signed)
[facility] office RNCM over to speak with patient to discuss her discharge plan as she has been a little indecisive on where she'd be going. [place], but she will not be going there to rehab as he can't be with her all the time. She will be going to her brother's home in town. Her sister in law will be home with her to assist. Referral to [facility] has been made aware of new address location. [address], [place], [place] [address]. PA is aware as well as floor CM. She is good for discharge today per PA.

## 2020-09-25 NOTE — Progress Notes (Signed)
Physical Therapy Treatment Patient Details Name: Jodi Williams MRN: [id number] DOB: Jul 13, 1950 Today's Date: 09/25/2020    History of Present Illness Pt is a 70 y/o female s/p L total knee revision. PMH includes bilateral partial knee replacements, DM, HTN, and COPD.    PT Comments    Pt continues to have limited L knee extension and is slowly progressing active L knee flexion. Focused on step through gait pattern this date to promote L knee extension and more fluid gait pattern. Pt remains very dependent on UEs for support due to L knee pain with WBing. Discussed d/c options with patient and Gordy Levan, [place]. Originally pt only had 2 options, home with dtr here in [place] but she would be home alone all day, or return to [facility] (3hrs away) with spouse who would be in/out t/o the day. During session pt then stated "I might be able to stay with my brother and sister-in-law here in [place] until my follow up appt on 5/26. Pt was to call brother to make sure her sister-in-law could be there with here t/o the day. Acute PT to cont to follow.   Follow Up Recommendations  Follow surgeon's recommendation for DC plan and follow-up therapies (talked about HHPT with Gordy Levan, PA, pt may be able to stay with brother her in [place] instead of going home to [facility])     Equipment Recommendations  3in1 (PT)    Recommendations for Other Services       Precautions / Restrictions Precautions Precautions: Knee Precaution Booklet Issued: No Precaution Comments: verbally reviewed Restrictions Weight Bearing Restrictions: Yes LLE Weight Bearing: Weight bearing as tolerated    Mobility  Bed Mobility               General bed mobility comments: pt received sitting EOB    Transfers Overall transfer level: Needs assistance Equipment used: Rolling walker (2 wheeled) Transfers: Sit to/from Stand Sit to Stand: Min guard Stand pivot transfers: Min guard       General transfer comment: min guard for safety, verbal cues for safe  hand placement, increased time  Ambulation/Gait Ambulation/Gait assistance: Min guard Gait Distance (Feet): 170 Feet Assistive device: Rolling walker (2 wheeled) Gait Pattern/deviations: Step-to pattern;Step-through pattern;Decreased stride length;Decreased stance time - left;Decreased step length - right;Ataxic Gait velocity: dec Gait velocity interpretation: <1.31 ft/sec, indicative of household ambulator General Gait Details: slow, pt initially with step to gait pattern requiring minA to maintain forward push of rolling walker to promote step through gait pattern, pt with significant UE support,encouraged patient to decreased UE support and increased Bilat LE WBing   Stairs Stairs: Yes Stairs assistance: Min guard Stair Management: Two rails;Step to pattern;Forwards Number of Stairs: 2 General stair comments: educated on "up with the good (R), down with the bad (L)" sequencing   Wheelchair Mobility    Modified Rankin (Stroke Patients Only)       Balance Overall balance assessment: Needs assistance Sitting-balance support: Feet supported;No upper extremity supported Sitting balance-Leahy Scale: Good     Standing balance support: During functional activity Standing balance-Leahy Scale: Fair Standing balance comment: pt able to stand at sink and wash hands without L knee buckling or leaning on sink                            Cognition Arousal/Alertness: Awake/alert Behavior During Therapy: WFL for tasks assessed/performed Overall Cognitive Status: Within Functional Limits for tasks assessed  General Comments: w      Exercises Total Joint Exercises Ankle Circles/Pumps: AROM;Both;10 reps;Seated Quad Sets: AROM;Both;5 reps;Seated Heel Slides: Left;10 reps;AROM;Seated Goniometric ROM: 8-87 with assist    General Comments General comments (skin integrity, edema, etc.): pt with noted drainage through L knee  dressing, [place], PA present and observed, pt assisted to the bathroom, supervision for transfer on/off commode, indep with pericare in sitting      Pertinent Vitals/Pain Pain Assessment: 0-10 Pain Score: 6  Pain Location: L knee Pain Descriptors / Indicators: Operative site guarding;Aching;Sore Pain Intervention(s): Monitored during session    Home Living                      Prior Function            PT Goals (current goals can now be found in the care plan section) Acute Rehab PT Goals Patient Stated Goal: make the swelling go down Progress towards PT goals: Progressing toward goals    Frequency    7X/week      PT Plan Current plan remains appropriate    Co-evaluation              AM-PAC PT "6 Clicks" Mobility   Outcome Measure  Help needed turning from your back to your side while in a flat bed without using bedrails?: A Little Help needed moving from lying on your back to sitting on the side of a flat bed without using bedrails?: A Little Help needed moving to and from a bed to a chair (including a wheelchair)?: A Little Help needed standing up from a chair using your arms (e.g., wheelchair or bedside chair)?: A Little Help needed to walk in hospital room?: A Little Help needed climbing 3-5 steps with a railing? : A Little 6 Click Score: 18    End of Session Equipment Utilized During Treatment: Gait belt Activity Tolerance: Patient tolerated treatment well Patient left: in chair;with call bell/phone within reach;with chair alarm set Nurse Communication: Mobility status PT Visit Diagnosis: Other abnormalities of gait and mobility (R26.89);Pain Pain - Right/Left: Left Pain - part of body: Knee     Time: 7116-5790 PT Time Calculation (min) (ACUTE ONLY): 28 min  Charges:  $Gait Training: 23-37 mins                     Kittie Plater, PT, DPT Acute Rehabilitation Services Pager #: [phone] Office #: [phone]    Berline Lopes 09/25/2020, 11:01 AM  :[id number]}

## 2020-09-25 NOTE — Progress Notes (Signed)
Subjective: 4 Days Post-Op Procedure(s) (LRB): LEFT KNEE REVISION ARTHROPLASTY (Left) Patient reports pain as moderate.  Concerns about discharge and having anyone around during the day. Up with PT walking in hallway.   Objective: Vital signs in last 24 hours: Temp:  [99.4 F (37.4 C)-99.7 F (37.6 C)] 99.7 F (37.6 C) (05/15 1900) Pulse Rate:  [105-112] 112 (05/15 1900) Resp:  [18] 18 (05/15 1900) BP: (122-134)/(62-63) 122/62 (05/15 1900) SpO2:  [98 %] 98 % (05/15 1900)  Intake/Output from previous day: 05/15 0701 - 05/16 0700 In: 240 [P.O.:240] Out: 400 [Urine:400] Intake/Output this shift: No intake/output data recorded.  Recent Labs    09/23/20 0223 09/23/20 1704 09/24/20 0251  HGB 7.5* 10.0* 8.8*   Recent Labs    09/23/20 1704 09/24/20 0251  WBC 12.3* 10.9*  RBC 3.61* 3.20*  HCT 30.3* 27.0*  PLT 155 154   Recent Labs    09/23/20 0223  NA 134*  K 3.7  CL 104  CO2 21*  BUN 9  CREATININE 1.08*  GLUCOSE 183*  CALCIUM 8.3*   No results for input(s): LABPT, INR in the last 72 hours.  Left lower extremity: Incision: scant drainage Compartment soft   Assessment/Plan: 4 Days Post-Op Procedure(s) (LRB): LEFT KNEE REVISION ARTHROPLASTY (Left)  Blood cultures negative for <24 hrs. . Afebrile.  Encourage incentive spirometry.  Up with therapy  Will see later today to possibly discharge to home.      [place] 09/25/2020, 8:02 AM

## 2020-09-25 NOTE — Progress Notes (Signed)
Pt given discharge instructions and gone over with her and husband. She verbalized understanding. Pt belongings gathered to be sent home. Husband to transport home.

## 2020-09-25 NOTE — Care Management Important Message (Signed)
Important Message  Patient Details  Name: Jodi Williams MRN: [id number] Date of Birth: [birth year]   Medicare Important Message Given:  Yes     [place] 09/25/2020, 2:21 PM

## 2020-09-25 NOTE — Progress Notes (Signed)
Physical Therapy Treatment Patient Details Name: Jodi Williams MRN: [id number] DOB: [birth year] Today's Date: 09/25/2020    History of Present Illness Pt is a 70 y/o female s/p L total knee revision. PMH includes bilateral partial knee replacements, DM, HTN, and COPD.    PT Comments    Session focused on review of HEP for ROM/strengthening and gait training. Pt able to progress into and out of bed without assist using a gait belt strap for LLE negotiation. Ambulating x 140 feet with a walker at a supervision level. Verbal/visual instruction on progressing to step through pattern; pt able to achieve ~25% of the time. Plan to discharge home to her brother's house with HHPT and then move back to [facility] with her husband and go to [place]. Pt brother's home has a ramped entrance and walk in shower with a seat. Pt with no further questions/concerns at this time.    Follow Up Recommendations  Home health PT     Equipment Recommendations  3in1 (PT)    Recommendations for Other Services       Precautions / Restrictions Precautions Precautions: Knee Precaution Comments: verbally reviewed Restrictions Weight Bearing Restrictions: Yes LLE Weight Bearing: Weight bearing as tolerated    Mobility  Bed Mobility Overal bed mobility: Modified Independent             General bed mobility comments: Use of gait belt to progress LLE on and off the bed    Transfers Overall transfer level: Needs assistance Equipment used: Rolling walker (2 wheeled) Transfers: Sit to/from Stand Sit to Stand: Supervision            Ambulation/Gait Ambulation/Gait assistance: Supervision Gait Distance (Feet): 140 Feet Assistive device: Rolling walker (2 wheeled) Gait Pattern/deviations: Step-to pattern;Step-through pattern;Decreased stride length;Decreased stance time - left;Decreased step length - right;Antalgic Gait velocity: dec   General Gait Details: Visual/verbal cueing for stepping through with RLE,  shorter L step length. Pt demonstrates good heel strike on left. Supervision overall for safety   Stairs             Wheelchair Mobility    Modified Rankin (Stroke Patients Only)       Balance Overall balance assessment: Needs assistance Sitting-balance support: Feet supported;No upper extremity supported Sitting balance-Leahy Scale: Good     Standing balance support: During functional activity Standing balance-Leahy Scale: Fair                              Cognition Arousal/Alertness: Awake/alert Behavior During Therapy: WFL for tasks assessed/performed Overall Cognitive Status: Within Functional Limits for tasks assessed                                 General Comments: Drowsy likely due to pain medication      Exercises Total Joint Exercises Quad Sets: AROM;Both;10 reps;Supine Heel Slides: Left;10 reps;AAROM;Supine Long Arc Quad: Left;10 reps;Seated    General Comments        Pertinent Vitals/Pain Pain Assessment: Faces Faces Pain Scale: Hurts little more Pain Location: L knee Pain Descriptors / Indicators: Operative site guarding;Aching;Sore Pain Intervention(s): Monitored during session    Home Living                      Prior Function            PT Goals (current goals can now be  found in the care plan section) Acute Rehab PT Goals Patient Stated Goal: improve range of motion Potential to Achieve Goals: Good Progress towards PT goals: Progressing toward goals    Frequency    7X/week      PT Plan Current plan remains appropriate    Co-evaluation              AM-PAC PT "6 Clicks" Mobility   Outcome Measure  Help needed turning from your back to your side while in a flat bed without using bedrails?: None Help needed moving from lying on your back to sitting on the side of a flat bed without using bedrails?: None Help needed moving to and from a bed to a chair (including a wheelchair)?: A  Little Help needed standing up from a chair using your arms (e.g., wheelchair or bedside chair)?: A Little Help needed to walk in hospital room?: A Little Help needed climbing 3-5 steps with a railing? : A Little 6 Click Score: 20    End of Session Equipment Utilized During Treatment: Gait belt Activity Tolerance: Patient tolerated treatment well Patient left: with call bell/phone within reach;in bed Nurse Communication: Mobility status PT Visit Diagnosis: Other abnormalities of gait and mobility (R26.89);Pain Pain - Right/Left: Left Pain - part of body: Knee     Time: 1310-1333 PT Time Calculation (min) (ACUTE ONLY): 23 min  Charges:  $Gait Training: 8-22 mins $Therapeutic Exercise: 8-22 mins                     Wyona Almas, PT, DPT Acute Rehabilitation Services Pager [phone] Office [place] 09/25/2020, 2:42 PM

## 2020-09-25 NOTE — Discharge Summary (Signed)
Patient ID: Jodi Williams MRN: [id number] DOB/AGE: 1950-09-28 70 y.o.  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 Allergy   . Anxiety   . Arthritis   . Asthma   . Cataract   . COPD (chronic obstructive pulmonary disease) ([place])   . Depression   . Diabetes mellitus ([place])   . Hyperlipidemia   . Hypertension   . PONV (postoperative nausea and vomiting)   . Ulcer     Surgeries: Procedure(s): LEFT KNEE REVISION ARTHROPLASTY on 09/21/2020   Consultants:   Discharged Condition: Improved  Hospital Course: Jodi Williams is an 70 y.o. female who was admitted 09/21/2020 for operative treatment ofFailed total left knee replacement ([place]). Patient has severe unremitting pain that affects sleep, daily activities, and work/hobbies. After pre-op clearance the patient was taken to the operating room on 09/21/2020 and underwent  Procedure(s): LEFT KNEE REVISION ARTHROPLASTY.    Patient was given perioperative antibiotics:  Anti-infectives (From admission, onward)   Start     Dose/Rate Route Frequency Ordered Stop   09/21/20 1545  clindamycin (CLEOCIN) IVPB 600 mg        600 mg 100 mL/hr over 30 Minutes Intravenous Every 6 hours 09/21/20 1457 09/21/20 2130   09/21/20 0700  clindamycin (CLEOCIN) IVPB 900 mg        900 mg 100 mL/hr over 30 Minutes Intravenous On call to O.R. 09/21/20 0347 09/21/20 0827       Patient was given sequential compression devices, early ambulation, and chemoprophylaxis to prevent DVT.  Patient benefited maximally from hospital stay and there were no complications.    Recent vital signs:  Patient Vitals for the past 24 hrs:  BP Temp Temp src  Pulse Resp SpO2  09/25/20 0700 107/69 -- Oral (!) 105 18 100 %  09/24/20 1900 122/62 99.7 F (37.6 C) Oral (!) 112 18 98 %  09/24/20 1621 134/63 99.4 F (37.4 C) -- (!) 105 18 98 %     Recent laboratory studies:  Recent Labs    09/23/20 0223 09/23/20 1704 09/24/20 0251  WBC 9.5 12.3* 10.9*  HGB 7.5* 10.0* 8.8*  HCT 23.8* 30.3* 27.0*  PLT 168 155 154  NA 134*  --   --   K 3.7  --   --   CL 104  --   --   CO2 21*  --   --   BUN 9  --   --   CREATININE 1.08*  --   --   GLUCOSE 183*  --   --   CALCIUM 8.3*  --   --      Discharge Medications:   Allergies as of 09/25/2020      Reactions   Omnicef [cefdinir] Rash      Medication List    STOP taking these medications   aspirin EC 81 MG tablet Replaced by: aspirin 81 MG chewable tablet     TAKE these medications   Accu-Chek Guide test strip Generic drug: glucose blood Use as instructed to test glucose up to twice a day   acetaminophen 650 MG CR tablet Commonly known as: TYLENOL Take 650 mg by mouth every 8 (eight) hours as needed  for pain.   albuterol 108 (90 Base) MCG/ACT inhaler Commonly known as: VENTOLIN HFA Inhale 2 puffs into the lungs every 4 (four) hours as needed for wheezing or shortness of breath (cough, shortness of breath or wheezing.). What changed: reasons to take this   ALPRAZolam 1 MG tablet Commonly known as: XANAX TAKE 1 TABLET BY MOUTH AT  BEDTIME AS NEEDED. FOR  SLEEP What changed:   when to take this  additional instructions   aspirin 81 MG chewable tablet Chew 1 tablet (81 mg total) by mouth 2 (two) times daily. Replaces: aspirin EC 81 MG tablet   CALCIUM 600 + D PO Take 2 tablets by mouth at bedtime.   cetirizine 10 MG tablet Commonly known as: ZYRTEC Take 10 mg by mouth daily.   cholecalciferol 25 MCG (1000 UNIT) tablet Commonly known as: VITAMIN D3 Take 1,000 Units by mouth at bedtime.   CINNAMON PO Take 2 capsules by mouth at bedtime.   citalopram 20 MG  tablet Commonly known as: CELEXA Take 1 tablet (20 mg total) by mouth daily.   Co Q-10 100 MG Caps Take 100 mg by mouth at bedtime.   gabapentin 300 MG capsule Commonly known as: NEURONTIN TAKE 1 CAPSULE BY MOUTH AT  BEDTIME FOR RESTLESS LEGS.  MAY TITRATE UP TO 3 TIMES  DAILY GRADUALLY IF NEEDED What changed: See the new instructions.   hydroxypropyl methylcellulose / hypromellose 2.5 % ophthalmic solution Commonly known as: ISOPTO TEARS / GONIOVISC Place 1 drop into both eyes 3 (three) times daily as needed for dry eyes.   lisinopril-hydrochlorothiazide 10-12.5 MG tablet Commonly known as: ZESTORETIC TAKE 1 TABLET BY MOUTH  DAILY What changed: how much to take   Melatonin 10 MG Tabs Take 10 mg by mouth at bedtime.   meloxicam 15 MG tablet Commonly known as: MOBIC Take 1 tablet (15 mg total) by mouth daily.   metFORMIN 1000 MG tablet Commonly known as: GLUCOPHAGE TAKE 1 TABLET BY MOUTH  TWICE DAILY WITH MEALS   methocarbamol 500 MG tablet Commonly known as: ROBAXIN Take 1 tablet (500 mg total) by mouth every 6 (six) hours as needed for muscle spasms.   milk thistle 175 MG tablet Take 350 mg by mouth at bedtime.   nortriptyline 10 MG capsule Commonly known as: PAMELOR TAKE 2 CAPSULES BY MOUTH AT BEDTIME   omeprazole 40 MG capsule Commonly known as: PRILOSEC Take 1 capsule (40 mg total) by mouth daily.   oxyCODONE 5 MG immediate release tablet Commonly known as: Oxy IR/ROXICODONE Take 1-2 tablets (5-10 mg total) by mouth every 4 (four) hours as needed for moderate pain (pain score 4-6).   pramipexole 0.25 MG tablet Commonly known as: MIRAPEX TAKE 1 TABLET BY MOUTH IN  THE EVENING AS NEEDED FOR  LEG PAIN. MAY TAKE 2ND  TABLET IF NEEDED What changed:   how much to take  how to take this  when to take this  additional instructions   Red Yeast Rice 600 MG Caps Take 1,200 mg by mouth at bedtime.   rizatriptan 5 MG disintegrating tablet Commonly known as:  Maxalt-MLT Take 1 tablet (5 mg total) by mouth as needed. May repeat in 2 hours if needed What changed: reasons to take this   rosuvastatin 5 MG tablet Commonly known as: CRESTOR TAKE 1 TABLET BY MOUTH  DAILY What changed: when to take this   Trulicity 1.5 VO/5.3GU Sopn Generic drug: Dulaglutide Inject 1.5 mg into the skin once a week. What  changed: when to take this   vitamin B-12 1000 MCG tablet Commonly known as: CYANOCOBALAMIN Take 1,000 mcg by mouth daily.   vitamin E 180 MG (400 UNITS) capsule Take 400 Units by mouth at bedtime.   WOMENS MULTIVITAMIN PO Take 1 tablet by mouth at bedtime.            Durable Medical Equipment  (From admission, onward)         Start     Ordered   09/22/20 1547  For home use only DME 3 n 1  Once        09/22/20 1547   09/21/20 1457  DME 3 n 1  Once        09/21/20 1457   09/21/20 1457  DME Walker rolling  Once       Question Answer Comment  Walker: With 5 Inch Wheels   Patient needs a walker to treat with the following condition Status post revision of total replacement of left knee      09/21/20 1457          Diagnostic Studies: DG CHEST PORT 1 VIEW  Result Date: 09/23/2020 CLINICAL DATA:  Febrile.  Recent knee surgery. EXAM: PORTABLE CHEST 1 VIEW COMPARISON:  Most recent radiographs 06/15/2015 FINDINGS: Mild chronic elevation of right hemidiaphragm. Streaky bilateral lung opacities typical of atelectasis. There is no confluent airspace disease. Normal heart size and mediastinal contours. No pleural effusion or pneumothorax. No acute osseous abnormalities are seen. IMPRESSION: Streaky bilateral lung opacities typical of atelectasis. No confluent airspace disease. Electronically Signed   By: Keith Rake M.D.   On: 09/23/2020 19:29   DG Knee Left Port  Result Date: 09/21/2020 CLINICAL DATA:  Status post total knee replacement EXAM: PORTABLE LEFT KNEE - 1-2 VIEW COMPARISON:  June 12, 2020 FINDINGS: Frontal and lateral  views were obtained. Patient is status post total knee replacement with prosthetic components well-seated. No fracture or dislocation. No erosion. There is generalized soft tissue swelling. There is fluid in air within the knee joint. There are overlying skin staples. IMPRESSION: Status post total knee replacement with prosthetic components well-seated. No fracture or dislocation. Soft tissue swelling with fluid and air within the joint, likely due to acute postoperative change. Electronically Signed   By: Lowella Grip III M.D.   On: 09/21/2020 13:47    Disposition: Discharge disposition: 01-Home or [place]    Mcarthur Rossetti, MD Follow up in 2 week(s).   Specialty: Orthopedic Surgery Contact information: [address] [place] [address] [phone]                Signed: Erskine Emery 09/25/2020, 12:32 PM

## 2020-09-25 NOTE — Plan of Care (Signed)
  Problem: Activity: Goal: Risk for activity intolerance will decrease Outcome: Progressing   Problem: Pain Managment: Goal: General experience of comfort will improve Outcome: Progressing   Problem: Safety: Goal: Ability to remain free from injury will improve Outcome: Progressing   

## 2020-09-27 ENCOUNTER — Telehealth: Payer: Self-pay

## 2020-09-27 NOTE — Telephone Encounter (Signed)
Pt called and would like to speak with the nurse about getting in home therpy in [place]

## 2020-09-27 NOTE — Telephone Encounter (Signed)
Patient will call me back with info on outpatient therapy in [facility] and I will send order

## 2020-09-28 LAB — CULTURE, BLOOD (ROUTINE X 2)
Culture: NO GROWTH
Culture: NO GROWTH

## 2020-10-02 DIAGNOSIS — M25562 Pain in left knee: Secondary | ICD-10-CM | POA: Diagnosis not present

## 2020-10-02 DIAGNOSIS — Z96652 Presence of left artificial knee joint: Secondary | ICD-10-CM | POA: Diagnosis not present

## 2020-10-03 ENCOUNTER — Telehealth: Payer: Self-pay | Admitting: Orthopaedic Surgery

## 2020-10-03 MED ORDER — OXYCODONE HCL 5 MG PO TABS: 5.0000 mg | ORAL_TABLET | Freq: Four times a day (QID) | ORAL | 0 refills | Status: DC | PRN

## 2020-10-03 NOTE — Telephone Encounter (Signed)
Pt called stating she had surgery on her knee on 09/21/20 and her pain levels are at about an 8 and is swollen. Pt would like to have something called in for pain she states she has two oxycodone tablets left but her next appt isn't until 10/05/20; and she lives in [place]. Pt has tried icing it and keeping it elevated but it's not helped so she would like a CB when something has been called in and she states it doesn't even have to be oxy she just needs something to help.    [phone]

## 2020-10-03 NOTE — Telephone Encounter (Signed)
I sent some into the [facility] in [facility].

## 2020-10-03 NOTE — Telephone Encounter (Signed)
Please advise 

## 2020-10-03 NOTE — Telephone Encounter (Signed)
Pt called and advised and stated understanding  

## 2020-10-04 DIAGNOSIS — M25562 Pain in left knee: Secondary | ICD-10-CM | POA: Diagnosis not present

## 2020-10-04 DIAGNOSIS — Z96652 Presence of left artificial knee joint: Secondary | ICD-10-CM | POA: Diagnosis not present

## 2020-10-05 ENCOUNTER — Ambulatory Visit (INDEPENDENT_AMBULATORY_CARE_PROVIDER_SITE_OTHER): Payer: Medicare Other | Admitting: Orthopaedic Surgery

## 2020-10-05 ENCOUNTER — Encounter: Payer: Self-pay | Admitting: Orthopaedic Surgery

## 2020-10-05 DIAGNOSIS — Z96652 Presence of left artificial knee joint: Secondary | ICD-10-CM

## 2020-10-05 NOTE — Progress Notes (Signed)
The patient is 2 weeks status post revision of a failed left unicompartmental knee replacement.  We found the components to be completely loose and the femoral component was grossly loose.  We then performed a total knee arthroplasty with revision stems.  She has been staying at [facility] where she lives and states she is doing well other than throbbing pain.  She is already been in outpatient physical therapy.  The staples been removed and the Steri-Strips applied.  Her incision looks good.  She lacks full extension by about 3 degrees but can already flex past 90 degrees.  Her calf is soft and her foot is well-perfused.  She will continue her once a day aspirin.  She will continue outpatient physical therapy as well.  We will send in pain medications when she needs them.  I like to see her back in 4 weeks to see how her swelling and motion is coming along.  I would like a standing AP and lateral of her left knee at that visit.

## 2020-10-10 DIAGNOSIS — Z96652 Presence of left artificial knee joint: Secondary | ICD-10-CM | POA: Diagnosis not present

## 2020-10-10 DIAGNOSIS — M25562 Pain in left knee: Secondary | ICD-10-CM | POA: Diagnosis not present

## 2020-10-12 DIAGNOSIS — Z96652 Presence of left artificial knee joint: Secondary | ICD-10-CM | POA: Diagnosis not present

## 2020-10-12 DIAGNOSIS — M25562 Pain in left knee: Secondary | ICD-10-CM | POA: Diagnosis not present

## 2020-10-13 DIAGNOSIS — M25562 Pain in left knee: Secondary | ICD-10-CM | POA: Diagnosis not present

## 2020-10-13 DIAGNOSIS — Z96652 Presence of left artificial knee joint: Secondary | ICD-10-CM | POA: Diagnosis not present

## 2020-10-17 DIAGNOSIS — M25562 Pain in left knee: Secondary | ICD-10-CM | POA: Diagnosis not present

## 2020-10-17 DIAGNOSIS — Z96652 Presence of left artificial knee joint: Secondary | ICD-10-CM | POA: Diagnosis not present

## 2020-10-19 ENCOUNTER — Other Ambulatory Visit: Payer: Self-pay | Admitting: Orthopaedic Surgery

## 2020-10-19 DIAGNOSIS — Z96652 Presence of left artificial knee joint: Secondary | ICD-10-CM | POA: Diagnosis not present

## 2020-10-19 DIAGNOSIS — M25562 Pain in left knee: Secondary | ICD-10-CM | POA: Diagnosis not present

## 2020-10-19 MED ORDER — TIZANIDINE HCL 4 MG PO TABS
4.0000 mg | ORAL_TABLET | Freq: Three times a day (TID) | ORAL | 1 refills | Status: DC | PRN
Start: 2020-10-19 — End: 2020-11-06

## 2020-10-24 DIAGNOSIS — Z96652 Presence of left artificial knee joint: Secondary | ICD-10-CM | POA: Diagnosis not present

## 2020-10-24 DIAGNOSIS — M25562 Pain in left knee: Secondary | ICD-10-CM | POA: Diagnosis not present

## 2020-10-26 DIAGNOSIS — Z96652 Presence of left artificial knee joint: Secondary | ICD-10-CM | POA: Diagnosis not present

## 2020-10-26 DIAGNOSIS — M25562 Pain in left knee: Secondary | ICD-10-CM | POA: Diagnosis not present

## 2020-10-31 DIAGNOSIS — Z96652 Presence of left artificial knee joint: Secondary | ICD-10-CM | POA: Diagnosis not present

## 2020-10-31 DIAGNOSIS — M25562 Pain in left knee: Secondary | ICD-10-CM | POA: Diagnosis not present

## 2020-11-02 DIAGNOSIS — M25562 Pain in left knee: Secondary | ICD-10-CM | POA: Diagnosis not present

## 2020-11-02 DIAGNOSIS — Z96652 Presence of left artificial knee joint: Secondary | ICD-10-CM | POA: Diagnosis not present

## 2020-11-04 ENCOUNTER — Other Ambulatory Visit: Payer: Self-pay | Admitting: Orthopaedic Surgery

## 2020-11-06 ENCOUNTER — Other Ambulatory Visit: Payer: Self-pay

## 2020-11-06 ENCOUNTER — Encounter: Payer: Self-pay | Admitting: Orthopaedic Surgery

## 2020-11-06 ENCOUNTER — Ambulatory Visit (INDEPENDENT_AMBULATORY_CARE_PROVIDER_SITE_OTHER): Payer: Medicare Other | Admitting: Orthopaedic Surgery

## 2020-11-06 ENCOUNTER — Ambulatory Visit: Payer: Self-pay

## 2020-11-06 DIAGNOSIS — Z96652 Presence of left artificial knee joint: Secondary | ICD-10-CM | POA: Diagnosis not present

## 2020-11-06 NOTE — Progress Notes (Signed)
The patient is now 6-week status post revision of a failed left knee unicompartmental replacement and conversion to a total knee arthroplasty.  She mainly resides at [facility].  She reports that that knee revision is now doing well and her pain is less and her motion is better.  Examination of her left operative knee shows minimal swelling.  Her range of motion is entirely full and her knee is ligamentously stable.  She is walking without a limp.  She denies any issues thus far with her right knee which does have a unicompartmental replacement that is the same as her left side.  2 views left knee show well-seated implant with no complicating features.  AP standing view also shows the right knee and that unicompartmental knee shows no acute findings.  She can stop therapy from my standpoint since she has made so much good progress.  All questions and concerns were answered addressed.  I can see her back in 6 months with a repeat AP and lateral both knees.  If there are issues before then she knows to come see [place] sooner.

## 2020-11-16 ENCOUNTER — Other Ambulatory Visit: Payer: Self-pay | Admitting: Family Medicine

## 2020-11-16 DIAGNOSIS — E119 Type 2 diabetes mellitus without complications: Secondary | ICD-10-CM

## 2020-11-22 ENCOUNTER — Other Ambulatory Visit: Payer: Self-pay | Admitting: Orthopaedic Surgery

## 2020-11-26 ENCOUNTER — Other Ambulatory Visit: Payer: Self-pay | Admitting: Family Medicine

## 2020-11-26 DIAGNOSIS — F418 Other specified anxiety disorders: Secondary | ICD-10-CM

## 2020-12-28 DIAGNOSIS — Z1211 Encounter for screening for malignant neoplasm of colon: Secondary | ICD-10-CM | POA: Diagnosis not present

## 2020-12-28 DIAGNOSIS — Z1231 Encounter for screening mammogram for malignant neoplasm of breast: Secondary | ICD-10-CM | POA: Diagnosis not present

## 2021-02-01 ENCOUNTER — Encounter: Payer: Self-pay | Admitting: Family Medicine

## 2021-02-02 ENCOUNTER — Other Ambulatory Visit: Payer: Self-pay | Admitting: Family Medicine

## 2021-02-02 DIAGNOSIS — F418 Other specified anxiety disorders: Secondary | ICD-10-CM

## 2021-02-05 ENCOUNTER — Other Ambulatory Visit: Payer: Self-pay

## 2021-02-05 DIAGNOSIS — G43909 Migraine, unspecified, not intractable, without status migrainosus: Secondary | ICD-10-CM

## 2021-02-05 MED ORDER — RIZATRIPTAN BENZOATE 5 MG PO TBDP: 5.0000 mg | ORAL_TABLET | ORAL | 12 refills | Status: DC | PRN

## 2021-02-14 ENCOUNTER — Encounter: Payer: Self-pay | Admitting: Family Medicine

## 2021-02-14 ENCOUNTER — Other Ambulatory Visit: Payer: Self-pay | Admitting: Family Medicine

## 2021-02-14 DIAGNOSIS — F418 Other specified anxiety disorders: Secondary | ICD-10-CM

## 2021-02-14 MED ORDER — ALPRAZOLAM 1 MG PO TABS: 1.0000 mg | ORAL_TABLET | Freq: Every evening | ORAL | 0 refills | Status: DC | PRN

## 2021-03-18 NOTE — Progress Notes (Deleted)
[place] at [facility] [address], [place], [place] [address] [phone]  Date:  03/21/2021   Name:  Jodi Williams   DOB:  [birth year]   MRN:  [id number]  PCP:  Darreld Mclean, MD    Chief Complaint: No chief complaint on file.   History of Present Illness:  Jodi Williams is a 70 y.o. very pleasant female patient who presents with the following:  Patient seen today for blood sugar follow-up Most recent visit with myself was in January- -she and her husband actually live near [facility], they are in this area today for some appointments Patient with history of hypertension, diabetes, depression, asthma, hyperlipidemia, arthritis status post knee replacement  COVID series Shingles vaccine Mammogram Foot exam due Flu shot labs Cologuard done in February, negative   Alprazolam nightly as needed Baby aspirin Celexa Trulicity  Gabapentin Lisinopril/hcyz Metformin 1000 BID Crestor  Patient Active Problem List   Diagnosis Date Noted   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) 09/21/2020   Status post revision of total replacement of left knee 09/21/2020   History of partial knee replacement 11/03/2017   Chronic pain of left knee 11/03/2017   Chronic migraine 03/31/2017   Preop cardiovascular exam 07/12/2015   Dyspnea 07/12/2015   Chest pain 07/12/2015   Restless legs 05/24/2015   Diabetes ([place]) 08/24/2013   Other and unspecified hyperlipidemia 09/07/2012   HTN (hypertension) 04/12/2012   OA (osteoarthritis) of knee 04/12/2012   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   Depression with anxiety 04/12/2012   H/O gastric ulcer 04/12/2012   ALLERGIC RHINITIS WITH CONJUNCTIVITIS 09/08/2007   ASTHMA 07/20/2007    Past Medical History:  Diagnosis Date   Allergy    Anxiety    Arthritis    Asthma    Cataract    COPD (chronic obstructive pulmonary disease) ([place])    Depression    Diabetes mellitus ([place])     Hyperlipidemia    Hypertension    PONV (postoperative nausea and vomiting)    Ulcer     Past Surgical History:  Procedure Laterality Date   BACK SURGERY     BREAST SURGERY     COLONOSCOPY WITH ESOPHAGOGASTRODUODENOSCOPY (EGD)     EYE SURGERY     cataract   HAND SURGERY     KNEE SURGERY     TOTAL KNEE REVISION Left 09/21/2020   Procedure: LEFT KNEE REVISION ARTHROPLASTY;  Surgeon: Mcarthur Rossetti, MD;  Location: [facility];  Service: Orthopedics;  Laterality: Left;    Social History   Tobacco Use   Smoking status: Never   Smokeless tobacco: Never  Vaping Use   Vaping Use: Never used  Substance Use Topics   Alcohol use: Not Currently    Alcohol/week: 0.0 standard drinks   Drug use: No    Family History  Problem Relation Age of Onset   Hyperlipidemia Mother    Hypertension Mother    Heart disease Mother    Dementia Mother    Arthritis Mother    Neuropathy Father    Diabetes Father    Kidney disease Father     Allergies  Allergen Reactions   Omnicef [Cefdinir] Rash    Medication list has been reviewed and updated.  Current Outpatient Medications on File Prior to Visit  Medication Sig Dispense Refill   acetaminophen (TYLENOL) 650 MG CR tablet Take 650 mg by mouth every 8 (eight) hours as needed for pain.  albuterol (PROVENTIL HFA;VENTOLIN HFA) 108 (90 BASE) MCG/ACT inhaler Inhale 2 puffs into the lungs every 4 (four) hours as needed for wheezing or shortness of breath (cough, shortness of breath or wheezing.). (Patient taking differently: Inhale 2 puffs into the lungs every 4 (four) hours as needed for wheezing or shortness of breath.) 1 Inhaler 1   ALPRAZolam (XANAX) 1 MG tablet Take 1 tablet (1 mg total) by mouth at bedtime as needed. for sleep 90 tablet 0   aspirin 81 MG chewable tablet Chew 1 tablet (81 mg total) by mouth 2 (two) times daily. 60 tablet 0   Calcium Carb-Cholecalciferol (CALCIUM 600 + D PO) Take 2 tablets by mouth at bedtime.     cetirizine  (ZYRTEC) 10 MG tablet Take 10 mg by mouth daily.     cholecalciferol (VITAMIN D3) 25 MCG (1000 UNIT) tablet Take 1,000 Units by mouth at bedtime.     CINNAMON PO Take 2 capsules by mouth at bedtime.     citalopram (CELEXA) 20 MG tablet Take 1 tablet (20 mg total) by mouth daily. 90 tablet 1   Coenzyme Q10 (CO Q-10) 100 MG CAPS Take 100 mg by mouth at bedtime.     Dulaglutide (TRULICITY) 1.5 ZD/6.6YQ SOPN Inject 1.5 mg into the skin once a week. (Patient taking differently: Inject 1.5 mg into the skin every Wednesday.) 6 mL 3   gabapentin (NEURONTIN) 300 MG capsule TAKE 1 CAPSULE BY MOUTH AT  BEDTIME FOR RESTLESS LEGS.  MAY TITRATE UP TO 3 TIMES  DAILY GRADUALLY IF NEEDED (Patient taking differently: Take 300 mg by mouth 2 (two) times daily.) 270 capsule 3   glucose blood (ACCU-CHEK GUIDE) test strip CHECK BLOOD SUGAR UP TO  TWICE DAILY AS DIRECTED 200 strip 1   hydroxypropyl methylcellulose / hypromellose (ISOPTO TEARS / GONIOVISC) 2.5 % ophthalmic solution Place 1 drop into both eyes 3 (three) times daily as needed for dry eyes.     lisinopril-hydrochlorothiazide (ZESTORETIC) 10-12.5 MG tablet TAKE 1 TABLET BY MOUTH  DAILY (Patient taking differently: Take 0.5 tablets by mouth daily.) 90 tablet 3   Melatonin 10 MG TABS Take 10 mg by mouth at bedtime.     meloxicam (MOBIC) 15 MG tablet Take 1 tablet (15 mg total) by mouth daily. 90 tablet 1   metFORMIN (GLUCOPHAGE) 1000 MG tablet TAKE 1 TABLET BY MOUTH  TWICE DAILY WITH MEALS (Patient taking differently: Take 1,000 mg by mouth 2 (two) times daily with a meal.) 180 tablet 3   milk thistle 175 MG tablet Take 350 mg by mouth at bedtime.     Multiple Vitamins-Minerals (WOMENS MULTIVITAMIN PO) Take 1 tablet by mouth at bedtime.     nortriptyline (PAMELOR) 10 MG capsule TAKE 2 CAPSULES BY MOUTH AT BEDTIME (Patient taking differently: Take 20 mg by mouth at bedtime.) 180 capsule 3   omeprazole (PRILOSEC) 40 MG capsule Take 1 capsule (40 mg total) by mouth  daily. 90 capsule 3   oxyCODONE (OXY IR/ROXICODONE) 5 MG immediate release tablet Take 1-2 tablets (5-10 mg total) by mouth every 6 (six) hours as needed for moderate pain (pain score 4-6). 30 tablet 0   pramipexole (MIRAPEX) 0.25 MG tablet TAKE 1 TABLET BY MOUTH IN  THE EVENING AS NEEDED FOR  LEG PAIN. MAY TAKE 2ND  TABLET IF NEEDED (Patient taking differently: Take 0.25 mg by mouth in the morning and at bedtime.) 180 tablet 3   Red Yeast Rice 600 MG CAPS Take 1,200 mg by mouth at  bedtime.     rizatriptan (MAXALT-MLT) 5 MG disintegrating tablet Take 1 tablet (5 mg total) by mouth as needed. May repeat in 2 hours if needed 15 tablet 12   rosuvastatin (CRESTOR) 5 MG tablet TAKE 1 TABLET BY MOUTH  DAILY (Patient taking differently: Take 5 mg by mouth every Monday, Wednesday, and Friday.) 30 tablet 11   tiZANidine (ZANAFLEX) 4 MG tablet TAKE 1 TABLET(4 MG) BY MOUTH EVERY 8 HOURS AS NEEDED FOR MUSCLE SPASMS 30 tablet 1   vitamin B-12 (CYANOCOBALAMIN) 1000 MCG tablet Take 1,000 mcg by mouth daily.     vitamin E 180 MG (400 UNITS) capsule Take 400 Units by mouth at bedtime.     No current facility-administered medications on file prior to visit.    Review of Systems:  As per HPI- otherwise negative.   Physical Examination: There were no vitals filed for this visit. There were no vitals filed for this visit. There is no height or weight on file to calculate BMI. Ideal Body Weight:    GEN: no acute distress. HEENT: Atraumatic, Normocephalic.  Ears and Nose: No external deformity. CV: RRR, No M/G/R. No JVD. No thrill. No extra heart sounds. PULM: CTA B, no wheezes, crackles, rhonchi. No retractions. No resp. distress. No accessory muscle use. ABD: S, NT, ND, +BS. No rebound. No HSM. EXTR: No c/c/e PSYCH: Normally interactive. Conversant.    Assessment and Plan: ***  Signed Lamar Blinks, MD

## 2021-03-18 NOTE — Patient Instructions (Incomplete)
Good to see you today, assuming all is well please see me in about 6 months

## 2021-03-20 NOTE — Patient Instructions (Addendum)
Good to see you again today- take care and I will be in touch with your labs asap Flu shot today  I would recommend that you get your covid series and the shingles vaccine series (shingrix) at your pharmacy asap Assuming your A1c is high we can increase your trulicity Please work on improving your diet- less sugar! - and try exercise to decrease stress.  Walking, exercise bike, swimming are all good options

## 2021-03-20 NOTE — Progress Notes (Addendum)
[facility] [address] [place], [place] [address] [phone]  Date:  03/26/2021   Name:  Jodi Williams   DOB:  June 14, 1950   MRN:  [id number]  PCP:  Darreld Mclean, MD    Chief Complaint: DM follow up (Recent blood sugar elevations./Concerns/ questions: pt has been feeling more anxious, struggling with sleep./Flu shot today: yes/)   History of Present Illness:  Jodi Williams is a 70 y.o. very pleasant female patient who presents with the following:  Following up today on her BP- last seen by myself in January   Patient with history of hypertension, diabetes, depression, asthma, hyperlipidemia, arthritis status post knee replacement  She had a left total knee in May of this year- anemia after procedure not rechecked yet  Her knee is doing well, she plans to get her right knee done soon   At our last visit her husband and I both encouraged her to get covid vaccinated-she still has not done so -encouraged her again   Mammo; done at her GYN, Dr Melba Coon  Foot exam-we will do today Flu shot- give today  A1 is due Pap:  done per her GYN She is not fasting today  She has noted glucose in the 400s over the last month or so-admits that she is eating a lot of candy  She plans to stop consuming so much sugar  Trulicity 1.5 weekly  Metformin 1000 BID  She admits to having some anxiety- she is taking celexa 20 mg daily  She is using xanax at bedtime- this helps her some but she still does not sleep great No SI  She just had an intense conversation with her daughter prior to her visit, she thinks this is why her heart rate is increased Lab Results  Component Value Date   HGBA1C 7.8 (H) 08/22/2020     Patient Active Problem List   Diagnosis Date Noted   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   Status post revision of total replacement of left knee 09/21/2020   History of partial knee replacement  11/03/2017   Chronic pain of left knee 11/03/2017   Chronic migraine 03/31/2017   Preop cardiovascular exam 07/12/2015   Dyspnea 07/12/2015   Chest pain 07/12/2015   Restless legs 05/24/2015   Diabetes ([place]) 08/24/2013   Other and unspecified hyperlipidemia 09/07/2012   HTN (hypertension) 04/12/2012   OA (osteoarthritis) of knee 04/12/2012   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   Depression with anxiety 04/12/2012   H/O gastric ulcer 04/12/2012   ALLERGIC RHINITIS WITH CONJUNCTIVITIS 09/08/2007   ASTHMA 07/20/2007    Past Medical History:  Diagnosis Date   Allergy    Anxiety    Arthritis    Asthma    Cataract    COPD (chronic obstructive pulmonary disease) ([place])    Depression    Diabetes mellitus ([place])    Hyperlipidemia    Hypertension    PONV (postoperative nausea and vomiting)    Ulcer     Past Surgical History:  Procedure Laterality Date   BACK SURGERY     BREAST SURGERY     COLONOSCOPY WITH ESOPHAGOGASTRODUODENOSCOPY (EGD)     EYE SURGERY     cataract   HAND SURGERY     KNEE SURGERY     TOTAL KNEE REVISION Left 09/21/2020   Procedure: LEFT KNEE REVISION ARTHROPLASTY;  Surgeon: Mcarthur Rossetti, MD;  Location: [facility];  Service: Orthopedics;  Laterality: Left;    Social History   Tobacco Use   Smoking status: Never   Smokeless tobacco: Never  Vaping Use   Vaping Use: Never used  Substance Use Topics   Alcohol use: Not Currently    Alcohol/week: 0.0 standard drinks   Drug use: No    Family History  Problem Relation Age of Onset   Hyperlipidemia Mother    Hypertension Mother    Heart disease Mother    Dementia Mother    Arthritis Mother    Neuropathy Father    Diabetes Father    Kidney disease Father     Allergies  Allergen Reactions   Omnicef [Cefdinir] Rash    Medication list has been reviewed and updated.  Current Outpatient Medications on File Prior to Visit  Medication Sig Dispense Refill   acetaminophen (TYLENOL) 650 MG CR  tablet Take 650 mg by mouth every 8 (eight) hours as needed for pain.     albuterol (PROVENTIL HFA;VENTOLIN HFA) 108 (90 BASE) MCG/ACT inhaler Inhale 2 puffs into the lungs every 4 (four) hours as needed for wheezing or shortness of breath (cough, shortness of breath or wheezing.). (Patient taking differently: Inhale 2 puffs into the lungs every 4 (four) hours as needed for wheezing or shortness of breath.) 1 Inhaler 1   ALPRAZolam (XANAX) 1 MG tablet Take 1 tablet (1 mg total) by mouth at bedtime as needed. for sleep 90 tablet 0   aspirin 81 MG chewable tablet Chew 1 tablet (81 mg total) by mouth 2 (two) times daily. 60 tablet 0   Calcium Carb-Cholecalciferol (CALCIUM 600 + D PO) Take 2 tablets by mouth at bedtime.     cetirizine (ZYRTEC) 10 MG tablet Take 10 mg by mouth daily.     cholecalciferol (VITAMIN D3) 25 MCG (1000 UNIT) tablet Take 1,000 Units by mouth at bedtime.     CINNAMON PO Take 2 capsules by mouth at bedtime.     citalopram (CELEXA) 20 MG tablet Take 1 tablet (20 mg total) by mouth daily. 90 tablet 1   Coenzyme Q10 (CO Q-10) 100 MG CAPS Take 100 mg by mouth at bedtime.     Dulaglutide (TRULICITY) 1.5 HW/2.9HB SOPN Inject 1.5 mg into the skin once a week. (Patient taking differently: Inject 1.5 mg into the skin every Wednesday.) 6 mL 3   gabapentin (NEURONTIN) 300 MG capsule TAKE 1 CAPSULE BY MOUTH AT  BEDTIME FOR RESTLESS LEGS.  MAY TITRATE UP TO 3 TIMES  DAILY GRADUALLY IF NEEDED (Patient taking differently: Take 300 mg by mouth 2 (two) times daily.) 270 capsule 3   glucose blood (ACCU-CHEK GUIDE) test strip CHECK BLOOD SUGAR UP TO  TWICE DAILY AS DIRECTED 200 strip 1   hydroxypropyl methylcellulose / hypromellose (ISOPTO TEARS / GONIOVISC) 2.5 % ophthalmic solution Place 1 drop into both eyes 3 (three) times daily as needed for dry eyes.     lisinopril-hydrochlorothiazide (ZESTORETIC) 10-12.5 MG tablet TAKE 1 TABLET BY MOUTH  DAILY (Patient taking differently: Take 0.5 tablets by  mouth daily.) 90 tablet 3   Melatonin 10 MG TABS Take 10 mg by mouth at bedtime.     meloxicam (MOBIC) 15 MG tablet Take 1 tablet (15 mg total) by mouth daily. 90 tablet 1   metFORMIN (GLUCOPHAGE) 1000 MG tablet TAKE 1 TABLET BY MOUTH  TWICE DAILY WITH MEALS (Patient taking differently: Take 1,000 mg by mouth 2 (two) times daily with a meal.) 180 tablet 3  milk thistle 175 MG tablet Take 350 mg by mouth at bedtime.     Multiple Vitamins-Minerals (WOMENS MULTIVITAMIN PO) Take 1 tablet by mouth at bedtime.     nortriptyline (PAMELOR) 10 MG capsule TAKE 2 CAPSULES BY MOUTH AT BEDTIME (Patient taking differently: Take 20 mg by mouth at bedtime.) 180 capsule 3   omeprazole (PRILOSEC) 40 MG capsule Take 1 capsule (40 mg total) by mouth daily. 90 capsule 3   oxyCODONE (OXY IR/ROXICODONE) 5 MG immediate release tablet Take 1-2 tablets (5-10 mg total) by mouth every 6 (six) hours as needed for moderate pain (pain score 4-6). 30 tablet 0   pramipexole (MIRAPEX) 0.25 MG tablet TAKE 1 TABLET BY MOUTH IN  THE EVENING AS NEEDED FOR  LEG PAIN. MAY TAKE 2ND  TABLET IF NEEDED (Patient taking differently: Take 0.25 mg by mouth in the morning and at bedtime.) 180 tablet 3   Red Yeast Rice 600 MG CAPS Take 1,200 mg by mouth at bedtime.     rizatriptan (MAXALT-MLT) 5 MG disintegrating tablet Take 1 tablet (5 mg total) by mouth as needed. May repeat in 2 hours if needed 15 tablet 12   rosuvastatin (CRESTOR) 5 MG tablet TAKE 1 TABLET BY MOUTH  DAILY (Patient taking differently: Take 5 mg by mouth every Monday, Wednesday, and Friday.) 30 tablet 11   tiZANidine (ZANAFLEX) 4 MG tablet TAKE 1 TABLET(4 MG) BY MOUTH EVERY 8 HOURS AS NEEDED FOR MUSCLE SPASMS 30 tablet 1   vitamin B-12 (CYANOCOBALAMIN) 1000 MCG tablet Take 1,000 mcg by mouth daily.     vitamin E 180 MG (400 UNITS) capsule Take 400 Units by mouth at bedtime.     No current facility-administered medications on file prior to visit.    Review of Systems:  As  per HPI- otherwise negative.   Physical Examination: Vitals:   03/26/21 1323  BP: 122/70  Pulse: (!) 111  Resp: 18  Temp: 98.2 F (36.8 C)  SpO2: 96%   Vitals:   03/26/21 1323  Weight: 169 lb (76.7 kg)  Height: 5\' 2"  (1.575 m)   Body mass index is 30.91 kg/m. Ideal Body Weight: Weight in (lb) to have BMI = 25: 136.4  Pulse Readings from Last 3 Encounters:  03/26/21 (!) 111  09/25/20 (!) 105  09/19/20 96    GEN: no acute distress.  Mildly obese, looks well HEENT: Atraumatic, Normocephalic.  Ears and Nose: No external deformity. CV: RRR, No M/G/R. No JVD. No thrill. No extra heart sounds. PULM: CTA B, no wheezes, crackles, rhonchi. No retractions. No resp. distress. No accessory muscle use. ABD: S, NT, ND, +BS. No rebound. No HSM. EXTR: No c/c/e PSYCH: Normally interactive. Conversant.  Foot exam is normal  Assessment and Plan: Controlled type 2 diabetes mellitus without complication, without long-term current use of insulin ([place]) - Plan: Comprehensive metabolic panel, Hemoglobin A1c  Essential hypertension, benign - Plan: CBC, Comprehensive metabolic panel  Dyslipidemia - Plan: Lipid panel  Screening for thyroid disorder - Plan: TSH  Fatigue, unspecified type - Plan: TSH, VITAMIN D 25 Hydroxy (Vit-D Deficiency, Fractures)  Need for influenza vaccination - Plan: Flu Vaccine QUAD High Dose(Fluad)  GAD (generalized anxiety disorder)  Patient seen today for follow-up.  Flu shot given, encouraged COVID vaccination Encouraged Shingrix Will plan further follow- up pending labs. She will keep an eye on her anxiety, if it is getting worse we can change her medication  Signed Lamar Blinks, MD  Addendum 11/15, received her labs as  below.  Message to patient  Results for orders placed or performed in visit on 03/26/21  CBC  Result Value Ref Range   WBC 8.7 4.0 - 10.5 K/uL   RBC 5.09 3.87 - 5.11 Mil/uL   Platelets 228.0 150.0 - 400.0 K/uL   Hemoglobin 13.9  12.0 - 15.0 g/dL   HCT 42.3 36.0 - 46.0 %   MCV 83.1 78.0 - 100.0 fl   MCHC 32.8 30.0 - 36.0 g/dL   RDW 13.8 11.5 - 15.5 %  Comprehensive metabolic panel  Result Value Ref Range   Sodium 132 (L) 135 - 145 mEq/L   Potassium 4.6 3.5 - 5.1 mEq/L   Chloride 96 96 - 112 mEq/L   CO2 22 19 - 32 mEq/L   Glucose, Bld 336 (H) 70 - 99 mg/dL   BUN 38 (H) 6 - 23 mg/dL   Creatinine, Ser 1.27 (H) 0.40 - 1.20 mg/dL   Total Bilirubin 0.4 0.2 - 1.2 mg/dL   Alkaline Phosphatase 90 39 - 117 U/L   AST 35 0 - 37 U/L   ALT 44 (H) 0 - 35 U/L   Total Protein 7.1 6.0 - 8.3 g/dL   Albumin 4.6 3.5 - 5.2 g/dL   GFR 43.38 (L) >60.00 mL/min   Calcium 9.9 8.4 - 10.5 mg/dL  Hemoglobin A1c  Result Value Ref Range   Hgb A1c MFr Bld 12.4 Repeated and verified X2. (H) 4.6 - 6.5 %  Lipid panel  Result Value Ref Range   Cholesterol 202 (H) 0 - 200 mg/dL   Triglycerides (H) 0.0 - 149.0 mg/dL    485.0 Triglyceride is over 400; calculations on Lipids are invalid.   HDL 37.30 (L) >39.00 mg/dL   Total CHOL/HDL Ratio 5   TSH  Result Value Ref Range   TSH 0.79 0.35 - 5.50 uIU/mL  VITAMIN D 25 Hydroxy (Vit-D Deficiency, Fractures)  Result Value Ref Range   VITD 46.20 30.00 - 100.00 ng/mL  LDL cholesterol, direct  Result Value Ref Range   Direct LDL 99.0 mg/dL

## 2021-03-21 ENCOUNTER — Ambulatory Visit: Payer: Medicare Other | Admitting: Family Medicine

## 2021-03-21 DIAGNOSIS — E669 Obesity, unspecified: Secondary | ICD-10-CM

## 2021-03-21 DIAGNOSIS — I10 Essential (primary) hypertension: Secondary | ICD-10-CM

## 2021-03-21 DIAGNOSIS — Z1329 Encounter for screening for other suspected endocrine disorder: Secondary | ICD-10-CM

## 2021-03-21 DIAGNOSIS — E785 Hyperlipidemia, unspecified: Secondary | ICD-10-CM

## 2021-03-21 DIAGNOSIS — R5383 Other fatigue: Secondary | ICD-10-CM

## 2021-03-21 DIAGNOSIS — E119 Type 2 diabetes mellitus without complications: Secondary | ICD-10-CM

## 2021-03-26 ENCOUNTER — Ambulatory Visit (INDEPENDENT_AMBULATORY_CARE_PROVIDER_SITE_OTHER): Payer: Medicare Other | Admitting: Family Medicine

## 2021-03-26 ENCOUNTER — Other Ambulatory Visit: Payer: Self-pay

## 2021-03-26 VITALS — BP 122/70 | HR 111 | Temp 98.2°F | Resp 18 | Ht 62.0 in | Wt 169.0 lb

## 2021-03-26 DIAGNOSIS — E119 Type 2 diabetes mellitus without complications: Secondary | ICD-10-CM | POA: Diagnosis not present

## 2021-03-26 DIAGNOSIS — E785 Hyperlipidemia, unspecified: Secondary | ICD-10-CM

## 2021-03-26 DIAGNOSIS — Z1329 Encounter for screening for other suspected endocrine disorder: Secondary | ICD-10-CM | POA: Diagnosis not present

## 2021-03-26 DIAGNOSIS — R5383 Other fatigue: Secondary | ICD-10-CM

## 2021-03-26 DIAGNOSIS — F411 Generalized anxiety disorder: Secondary | ICD-10-CM

## 2021-03-26 DIAGNOSIS — I10 Essential (primary) hypertension: Secondary | ICD-10-CM

## 2021-03-26 DIAGNOSIS — Z23 Encounter for immunization: Secondary | ICD-10-CM

## 2021-03-27 ENCOUNTER — Encounter: Payer: Self-pay | Admitting: Family Medicine

## 2021-03-27 DIAGNOSIS — E119 Type 2 diabetes mellitus without complications: Secondary | ICD-10-CM

## 2021-03-27 LAB — COMPREHENSIVE METABOLIC PANEL
ALT: 44 U/L — ABNORMAL HIGH (ref 0–35)
AST: 35 U/L (ref 0–37)
Albumin: 4.6 g/dL (ref 3.5–5.2)
Alkaline Phosphatase: 90 U/L (ref 39–117)
BUN: 38 mg/dL — ABNORMAL HIGH (ref 6–23)
CO2: 22 mEq/L (ref 19–32)
Calcium: 9.9 mg/dL (ref 8.4–10.5)
Chloride: 96 mEq/L (ref 96–112)
Creatinine, Ser: 1.27 mg/dL — ABNORMAL HIGH (ref 0.40–1.20)
GFR: 43.38 mL/min — ABNORMAL LOW (ref >60.00)
Glucose, Bld: 336 mg/dL — ABNORMAL HIGH (ref 70–99)
Potassium: 4.6 mEq/L (ref 3.5–5.1)
Sodium: 132 mEq/L — ABNORMAL LOW (ref 135–145)
Total Bilirubin: 0.4 mg/dL (ref 0.2–1.2)
Total Protein: 7.1 g/dL (ref 6.0–8.3)

## 2021-03-27 LAB — LIPID PANEL
Cholesterol: 202 mg/dL — ABNORMAL HIGH (ref 0–200)
HDL: 37.3 mg/dL — ABNORMAL LOW (ref >39.00)
Total CHOL/HDL Ratio: 5
Triglycerides: 485 mg/dL — ABNORMAL HIGH (ref 0.0–149.0)

## 2021-03-27 LAB — HEMOGLOBIN A1C: Hgb A1c MFr Bld: 12.4 % — ABNORMAL HIGH (ref 4.6–6.5)

## 2021-03-27 LAB — CBC
HCT: 42.3 % (ref 36.0–46.0)
Hemoglobin: 13.9 g/dL (ref 12.0–15.0)
MCHC: 32.8 g/dL (ref 30.0–36.0)
MCV: 83.1 fl (ref 78.0–100.0)
Platelets: 228 10*3/uL (ref 150.0–400.0)
RBC: 5.09 Mil/uL (ref 3.87–5.11)
RDW: 13.8 % (ref 11.5–15.5)
WBC: 8.7 10*3/uL (ref 4.0–10.5)

## 2021-03-27 LAB — TSH: TSH: 0.79 u[IU]/mL (ref 0.35–5.50)

## 2021-03-27 LAB — VITAMIN D 25 HYDROXY (VIT D DEFICIENCY, FRACTURES): VITD: 46.2 ng/mL (ref 30.00–100.00)

## 2021-03-27 LAB — LDL CHOLESTEROL, DIRECT: Direct LDL: 99 mg/dL

## 2021-03-27 MED ORDER — TRULICITY 3 MG/0.5ML ~~LOC~~ SOAJ: 3.0000 mg | SUBCUTANEOUS | 3 refills | Status: DC

## 2021-04-29 ENCOUNTER — Other Ambulatory Visit: Payer: Self-pay | Admitting: Family Medicine

## 2021-04-29 DIAGNOSIS — F418 Other specified anxiety disorders: Secondary | ICD-10-CM

## 2021-04-30 MED ORDER — ALPRAZOLAM 1 MG PO TABS: 1.0000 mg | ORAL_TABLET | Freq: Every evening | ORAL | 0 refills | Status: DC | PRN

## 2021-04-30 NOTE — Telephone Encounter (Signed)
Patient comment: I would like to pu this in [place] because im [employer] for holiday and im going to run out.  Thanks Dr Lorelei Pont

## 2021-05-10 ENCOUNTER — Other Ambulatory Visit: Payer: Self-pay | Admitting: Family Medicine

## 2021-05-10 DIAGNOSIS — R1013 Epigastric pain: Secondary | ICD-10-CM

## 2021-05-10 DIAGNOSIS — I10 Essential (primary) hypertension: Secondary | ICD-10-CM | POA: Diagnosis not present

## 2021-05-10 DIAGNOSIS — Z20822 Contact with and (suspected) exposure to covid-19: Secondary | ICD-10-CM | POA: Diagnosis not present

## 2021-05-10 DIAGNOSIS — E119 Type 2 diabetes mellitus without complications: Secondary | ICD-10-CM | POA: Diagnosis not present

## 2021-05-10 DIAGNOSIS — U071 COVID-19: Secondary | ICD-10-CM | POA: Diagnosis not present

## 2021-05-14 ENCOUNTER — Encounter: Payer: Self-pay | Admitting: Family Medicine

## 2021-05-14 DIAGNOSIS — E119 Type 2 diabetes mellitus without complications: Secondary | ICD-10-CM

## 2021-05-16 ENCOUNTER — Encounter: Payer: Self-pay | Admitting: Family Medicine

## 2021-05-16 DIAGNOSIS — E119 Type 2 diabetes mellitus without complications: Secondary | ICD-10-CM

## 2021-05-17 MED ORDER — TRULICITY 3 MG/0.5ML ~~LOC~~ SOAJ: 3.0000 mg | SUBCUTANEOUS | 3 refills | Status: DC

## 2021-05-25 ENCOUNTER — Other Ambulatory Visit: Payer: Self-pay | Admitting: Family Medicine

## 2021-05-25 DIAGNOSIS — E119 Type 2 diabetes mellitus without complications: Secondary | ICD-10-CM

## 2021-05-27 ENCOUNTER — Encounter: Payer: Self-pay | Admitting: Family Medicine

## 2021-05-27 DIAGNOSIS — F418 Other specified anxiety disorders: Secondary | ICD-10-CM

## 2021-05-28 MED ORDER — CITALOPRAM HYDROBROMIDE 20 MG PO TABS: 20.0000 mg | ORAL_TABLET | Freq: Every day | ORAL | 1 refills | Status: DC

## 2021-05-29 ENCOUNTER — Encounter: Payer: Self-pay | Admitting: Family Medicine

## 2021-05-29 DIAGNOSIS — G43909 Migraine, unspecified, not intractable, without status migrainosus: Secondary | ICD-10-CM

## 2021-05-30 MED ORDER — RIZATRIPTAN BENZOATE 5 MG PO TBDP: 5.0000 mg | ORAL_TABLET | ORAL | 5 refills | Status: DC | PRN

## 2021-06-12 ENCOUNTER — Other Ambulatory Visit: Payer: Self-pay | Admitting: Family Medicine

## 2021-06-12 DIAGNOSIS — E1165 Type 2 diabetes mellitus with hyperglycemia: Secondary | ICD-10-CM

## 2021-06-12 MED ORDER — RIZATRIPTAN BENZOATE 5 MG PO TBDP: 5.0000 mg | ORAL_TABLET | ORAL | 5 refills | Status: DC | PRN

## 2021-06-12 MED ORDER — PEN NEEDLES 32G X 4 MM MISC: 2 refills | Status: DC

## 2021-06-12 MED ORDER — LANTUS SOLOSTAR 100 UNIT/ML ~~LOC~~ SOPN: PEN_INJECTOR | SUBCUTANEOUS | 3 refills | Status: DC

## 2021-06-13 ENCOUNTER — Telehealth: Payer: Self-pay | Admitting: Family Medicine

## 2021-06-13 NOTE — Telephone Encounter (Signed)
Pharmacy states they need to know max daily dose of  insulin glargine (LANTUS SOLOSTAR) 100 UNIT/ML Solostar Pen  for insurance purposes. Please advise.   [place], [facility] [address]  [address], [place] [address]  Phone:  [phone]  Fax:  [phone]

## 2021-06-13 NOTE — Telephone Encounter (Signed)
Please advise on max daily.

## 2021-06-13 NOTE — Telephone Encounter (Signed)
Tried calling pharmacy back stayed on hold for 10 mins- will try again.

## 2021-06-14 NOTE — Telephone Encounter (Signed)
Called and was able to reach, clarified max dosage of lantus

## 2021-06-17 ENCOUNTER — Other Ambulatory Visit: Payer: Self-pay | Admitting: Family Medicine

## 2021-06-21 ENCOUNTER — Other Ambulatory Visit: Payer: Self-pay | Admitting: Family Medicine

## 2021-06-21 DIAGNOSIS — E119 Type 2 diabetes mellitus without complications: Secondary | ICD-10-CM

## 2021-06-28 ENCOUNTER — Other Ambulatory Visit: Payer: Self-pay | Admitting: Family Medicine

## 2021-06-28 DIAGNOSIS — G2581 Restless legs syndrome: Secondary | ICD-10-CM

## 2021-06-28 DIAGNOSIS — I10 Essential (primary) hypertension: Secondary | ICD-10-CM

## 2021-07-23 DIAGNOSIS — H35033 Hypertensive retinopathy, bilateral: Secondary | ICD-10-CM | POA: Diagnosis not present

## 2021-07-23 DIAGNOSIS — E119 Type 2 diabetes mellitus without complications: Secondary | ICD-10-CM | POA: Diagnosis not present

## 2021-07-23 DIAGNOSIS — H16223 Keratoconjunctivitis sicca, not specified as Sjogren's, bilateral: Secondary | ICD-10-CM | POA: Diagnosis not present

## 2021-07-23 LAB — HM DIABETES EYE EXAM

## 2021-07-28 DIAGNOSIS — J069 Acute upper respiratory infection, unspecified: Secondary | ICD-10-CM | POA: Diagnosis not present

## 2021-07-31 ENCOUNTER — Other Ambulatory Visit: Payer: Self-pay | Admitting: Family Medicine

## 2021-08-01 NOTE — Telephone Encounter (Signed)
30 day supply sent, letter sent to Pt via mychart to inform that she is overdue for 3 month f/u.  ?

## 2021-08-10 ENCOUNTER — Other Ambulatory Visit: Payer: Self-pay | Admitting: Family Medicine

## 2021-08-10 DIAGNOSIS — F418 Other specified anxiety disorders: Secondary | ICD-10-CM

## 2021-08-15 MED ORDER — TRULICITY 1.5 MG/0.5ML ~~LOC~~ SOAJ: 1.5000 mg | SUBCUTANEOUS | 0 refills | Status: DC

## 2021-08-15 NOTE — Addendum Note (Signed)
Addended by: Lamar Blinks C on: 08/15/2021 08:40 PM ? ? Modules accepted: Orders ? ?

## 2021-09-03 MED ORDER — LANTUS SOLOSTAR 100 UNIT/ML ~~LOC~~ SOPN: PEN_INJECTOR | SUBCUTANEOUS | 1 refills | Status: DC

## 2021-09-03 NOTE — Addendum Note (Signed)
Addended by: Lamar Blinks C on: 09/03/2021 01:43 PM ? ? Modules accepted: Orders ? ?

## 2021-09-05 ENCOUNTER — Telehealth: Payer: Self-pay | Admitting: Family Medicine

## 2021-09-05 NOTE — Telephone Encounter (Signed)
Left message for patient to call back and schedule Medicare Annual Wellness Visit (AWV).  ? ?Please offer to do virtually or by telephone.  Left office number and my jabber [phone]. ? ?Last AWV:08/31/2020 ? ?Please schedule at anytime with Nurse Health Advisor. ?  ?

## 2021-09-14 NOTE — Patient Instructions (Signed)
Called pt- she states her glucose come down into the 200s and she feels better.  Advised to stay on 5 u of lantus for the next few days.  She started trulicity 3 mg today  ?

## 2021-10-02 ENCOUNTER — Other Ambulatory Visit: Payer: Self-pay | Admitting: Family Medicine

## 2021-10-08 ENCOUNTER — Other Ambulatory Visit: Payer: Self-pay | Admitting: Family Medicine

## 2021-10-08 DIAGNOSIS — F418 Other specified anxiety disorders: Secondary | ICD-10-CM

## 2021-10-11 ENCOUNTER — Telehealth: Payer: Self-pay | Admitting: Family Medicine

## 2021-10-11 NOTE — Telephone Encounter (Signed)
Left message for patient to call back and schedule Medicare Annual Wellness Visit (AWV).   Please offer to do virtually or by telephone.  Left office number and my jabber #[phone].  Last AWV:08/31/2020  Please schedule at anytime with Nurse Health Advisor.   

## 2021-10-29 ENCOUNTER — Other Ambulatory Visit: Payer: Self-pay | Admitting: Family Medicine

## 2021-10-29 DIAGNOSIS — F418 Other specified anxiety disorders: Secondary | ICD-10-CM

## 2021-11-08 ENCOUNTER — Telehealth: Payer: Self-pay | Admitting: Family Medicine

## 2021-11-08 NOTE — Telephone Encounter (Signed)
Left message for patient to call back and schedule Medicare Annual Wellness Visit (AWV).   Please offer to do virtually or by telephone.  Left office number and my jabber [phone].  Last AWV:08/31/2020  Please schedule at anytime with Nurse Health Advisor.

## 2021-11-27 ENCOUNTER — Encounter: Payer: Self-pay | Admitting: Family Medicine

## 2021-11-27 DIAGNOSIS — E1165 Type 2 diabetes mellitus with hyperglycemia: Secondary | ICD-10-CM

## 2021-11-28 MED ORDER — TRULICITY 4.5 MG/0.5ML ~~LOC~~ SOAJ: 4.5000 mg | SUBCUTANEOUS | 2 refills | Status: DC

## 2021-11-28 NOTE — Addendum Note (Signed)
Addended by: Lamar Blinks C on: 11/28/2021 11:02 AM   Modules accepted: Orders

## 2022-01-08 ENCOUNTER — Telehealth: Payer: Self-pay

## 2022-01-08 NOTE — Telephone Encounter (Signed)
Pt called to schedule AWV  per husband he will have her call back

## 2022-01-14 NOTE — Progress Notes (Signed)
[place] at [facility] [place], [place], [place] [address] [phone]  Date:  01/17/2022   Name:  Jodi Williams   DOB:  1951-03-07   MRN:  [id number]  PCP:  Darreld Mclean, MD    Chief Complaint: Follow-up (Concerns/ questions: )   History of Present Illness:  Jodi Williams is a 71 y.o. very pleasant female patient who presents with the following:  Patient seen today for follow-up of her A1c- history of hypertension, diabetes, depression, asthma, hyperlipidemia, arthritis status post knee replacement Most recent visit with myself was in November She splits her time between [place] and [facility]  She has had some difficulty getting her GLP-1 filled recently but has been back on it for about 6 weeks so far She is not having diarrhea or other GI side effects She has noted some outbreak near her anus for about a week, she is not sure what caused this She used some cream OTC and it cleared this problem up-she notes now back to normal  She hopes to get more exercise but it has been very hot outdoors  She does have a gym membership, I encouraged her to try exercising indoors  Her glucose has been running 200 or less If her glucose is over 200 she is using lantus 5units; she has not needed this very often  She does note that sometimes she experiences discomfort in her right upper quadrant, she feels as though this area is more prominent.  This may happen more frequently after meals.  She has noticed this for a few months, will come and go.  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   Status post revision of total replacement of left knee 09/21/2020   History of partial knee replacement 11/03/2017   Chronic pain of left knee 11/03/2017   Chronic migraine 03/31/2017   Preop cardiovascular exam 07/12/2015   Dyspnea 07/12/2015   Chest pain 07/12/2015   Restless legs 05/24/2015   Diabetes ([place]) 08/24/2013   Other and unspecified hyperlipidemia 09/07/2012   HTN (hypertension) 04/12/2012   OA (osteoarthritis) of knee 04/12/2012   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   Depression with anxiety 04/12/2012   H/O gastric ulcer 04/12/2012   ALLERGIC RHINITIS WITH CONJUNCTIVITIS 09/08/2007   ASTHMA 07/20/2007    Past Medical History:  Diagnosis Date   Allergy    Anxiety    Arthritis    Asthma    Cataract    COPD (chronic obstructive pulmonary disease) ([place])    Depression    Diabetes mellitus ([place])    Hyperlipidemia    Hypertension    PONV (postoperative nausea and vomiting)    Ulcer     Past Surgical History:  Procedure Laterality Date   BACK SURGERY     BREAST SURGERY     COLONOSCOPY WITH ESOPHAGOGASTRODUODENOSCOPY (EGD)     EYE SURGERY     cataract  HAND SURGERY     KNEE SURGERY     TOTAL KNEE REVISION Left 09/21/2020   Procedure: LEFT KNEE REVISION ARTHROPLASTY;  Surgeon: Mcarthur Rossetti, MD;  Location: [facility];  Service: Orthopedics;  Laterality: Left;    Social History   Tobacco Use   Smoking status: Never   Smokeless tobacco: Never  Vaping Use   Vaping Use: Never used  Substance Use Topics   Alcohol use: Not Currently    Alcohol/week: 0.0 standard drinks of alcohol   Drug use: No    Family History  Problem Relation Age of Onset   Hyperlipidemia Mother    Hypertension Mother    Heart disease Mother    Dementia Mother    Arthritis Mother     Neuropathy Father    Diabetes Father    Kidney disease Father     Allergies  Allergen Reactions   Omnicef [Cefdinir] Rash    Medication list has been reviewed and updated.  Current Outpatient Medications on File Prior to Visit  Medication Sig Dispense Refill   acetaminophen (TYLENOL) 650 MG CR tablet Take 650 mg by mouth every 8 (eight) hours as needed for pain.     albuterol (PROVENTIL HFA;VENTOLIN HFA) 108 (90 BASE) MCG/ACT inhaler Inhale 2 puffs into the lungs every 4 (four) hours as needed for wheezing or shortness of breath (cough, shortness of breath or wheezing.). (Patient taking differently: Inhale 2 puffs into the lungs every 4 (four) hours as needed for wheezing or shortness of breath.) 1 Inhaler 1   ALPRAZolam (XANAX) 1 MG tablet TAKE 1 TABLET BY MOUTH AT  BEDTIME AS NEEDED FOR SLEEP 90 tablet 1   aspirin 81 MG chewable tablet Chew 1 tablet (81 mg total) by mouth 2 (two) times daily. 60 tablet 0   Calcium Carb-Cholecalciferol (CALCIUM 600 + D PO) Take 2 tablets by mouth at bedtime.     cetirizine (ZYRTEC) 10 MG tablet Take 10 mg by mouth daily.     cholecalciferol (VITAMIN D3) 25 MCG (1000 UNIT) tablet Take 1,000 Units by mouth at bedtime.     CINNAMON PO Take 2 capsules by mouth at bedtime.     citalopram (CELEXA) 20 MG tablet TAKE 1 TABLET BY MOUTH DAILY 90 tablet 1   Dulaglutide (TRULICITY) 4.5 QQ/5.9DG SOPN Inject 4.5 mg as directed once a week. 6 mL 2   gabapentin (NEURONTIN) 300 MG capsule TAKE 1 CAPSULE BY MOUTH AT  BEDTIME FOR RESTLESS LEGS.  MAY TITRATE UP TO 3 TIMES  DAILY GRADUALLY IF NEEDED 270 capsule 3   glucose blood (ACCU-CHEK GUIDE) test strip CHECK BLOOD SUGAR UP TO  TWICE DAILY AS DIRECTED 200 strip 12   hydroxypropyl methylcellulose / hypromellose (ISOPTO TEARS / GONIOVISC) 2.5 % ophthalmic solution Place 1 drop into both eyes 3 (three) times daily as needed for dry eyes.     insulin glargine (LANTUS SOLOSTAR) 100 UNIT/ML Solostar Pen Start at 10 units [place]  daily and increase as directed 15 mL 3   Insulin Pen Needle (PEN NEEDLES) 32G X 4 MM MISC Use one daily for insulin injection 100 each 2   lisinopril-hydrochlorothiazide (ZESTORETIC) 10-12.5 MG tablet TAKE 1 TABLET BY MOUTH  DAILY 90 tablet 3   meloxicam (MOBIC) 15 MG tablet TAKE 1 TABLET BY MOUTH DAILY 90 tablet 1   metFORMIN (GLUCOPHAGE) 1000 MG tablet TAKE 1 TABLET BY MOUTH  TWICE DAILY WITH MEALS 180 tablet 3   milk thistle 175 MG tablet Take 350 mg  by mouth at bedtime.     Multiple Vitamins-Minerals (WOMENS MULTIVITAMIN PO) Take 1 tablet by mouth at bedtime.     nortriptyline (PAMELOR) 10 MG capsule TAKE 2 CAPSULES BY MOUTH AT BEDTIME 180 capsule 3   omeprazole (PRILOSEC) 40 MG capsule TAKE 1 CAPSULE BY MOUTH  DAILY 90 capsule 3   pramipexole (MIRAPEX) 0.25 MG tablet TAKE 1 TABLET BY MOUTH IN  THE EVENING AS NEEDED FOR  LEG PAIN. MAY TAKE 2ND  TABLET IF NEEDED 180 tablet 3   rizatriptan (MAXALT-MLT) 5 MG disintegrating tablet Take 1 tablet (5 mg total) by mouth as needed. May repeat in 2 hours if needed 15 tablet 5   rosuvastatin (CRESTOR) 5 MG tablet TAKE 1 TABLET BY MOUTH  DAILY 30 tablet 11   tiZANidine (ZANAFLEX) 4 MG tablet TAKE 1 TABLET(4 MG) BY MOUTH EVERY 8 HOURS AS NEEDED FOR MUSCLE SPASMS 30 tablet 1   vitamin B-12 (CYANOCOBALAMIN) 1000 MCG tablet Take 1,000 mcg by mouth daily.     vitamin E 180 MG (400 UNITS) capsule Take 400 Units by mouth at bedtime.     Coenzyme Q10 (CO Q-10) 100 MG CAPS Take 100 mg by mouth at bedtime. (Patient not taking: Reported on 01/17/2022)     No current facility-administered medications on file prior to visit.    Review of Systems:  As per HPI- otherwise negative.Marland Kitchen   Physical Examination: Vitals:   01/17/22 0944  BP: 112/72  Pulse: 81  Resp: 18  Temp: 98 F (36.7 C)  SpO2: 98%   Vitals:   01/17/22 0944  Weight: 164 lb 9.6 oz (74.7 kg)  Height: '5\' 2"'$  (1.575 m)   Body mass index is 30.11 kg/m. Ideal Body Weight: Weight in (lb) to have  BMI = 25: 136.4  GEN: no acute distress.  Looks well, has gotten a lot of sun over the summer HEENT: Atraumatic, Normocephalic. Bilateral TM wnl, oropharynx normal.  PEERL,EOMI.   Ears and Nose: No external deformity. CV: RRR, No M/G/R. No JVD. No thrill. No extra heart sounds. PULM: CTA B, no wheezes, crackles, rhonchi. No retractions. No resp. distress. No accessory muscle use. ABD: S, NT, ND, +BS. No rebound. No HSM.  Belly is benign at this time EXTR: No c/c/e PSYCH: Normally interactive. Conversant.    Assessment and Plan: Uncontrolled type 2 diabetes mellitus with hyperglycemia ([place]) - Plan: Hemoglobin A1c, Comprehensive metabolic panel  Screening for thyroid disorder - Plan: TSH  Dyslipidemia - Plan: Lipid panel  Screening for deficiency anemia - Plan: CBC  RUQ pain - Plan: US Abdomen Limited RUQ (LIVER/GB)  Need for immunization against influenza - Plan: Flu Vaccine QUAD High Dose(Fluad)  Patient in today for follow-up of diabetes.  She endorses improved control with current dose of Trulicity, 4.5 mg Will be in touch with her labs as above Gave flu shot She notes right upper quadrant pain intermittently, can be worse after eating.  Suspicious for gallbladder colic.  We will order an ultrasound, she is asked to employ a low-fat diet for now, seek care at the emergency department if symptoms are getting worse  Signed Lamar Blinks, MD  Received labs as below, message to patient Results for orders placed or performed in visit on 01/17/22  Hemoglobin A1c  Result Value Ref Range   Hgb A1c MFr Bld 8.8 (H) 4.6 - 6.5 %  CBC  Result Value Ref Range   WBC 7.7 4.0 - 10.5 K/uL   RBC 4.57 3.87 - 5.11  Mil/uL   Platelets 208.0 150.0 - 400.0 K/uL   Hemoglobin 12.7 12.0 - 15.0 g/dL   HCT 38.4 36.0 - 46.0 %   MCV 84.0 78.0 - 100.0 fl   MCHC 33.0 30.0 - 36.0 g/dL   RDW 13.9 11.5 - 15.5 %  Comprehensive metabolic panel  Result Value Ref Range   Sodium 136 135 - 145 mEq/L    Potassium 4.5 3.5 - 5.1 mEq/L   Chloride 99 96 - 112 mEq/L   CO2 29 19 - 32 mEq/L   Glucose, Bld 167 (H) 70 - 99 mg/dL   BUN 17 6 - 23 mg/dL   Creatinine, Ser 1.01 0.40 - 1.20 mg/dL   Total Bilirubin 0.3 0.2 - 1.2 mg/dL   Alkaline Phosphatase 68 39 - 117 U/L   AST 16 0 - 37 U/L   ALT 21 0 - 35 U/L   Total Protein 6.8 6.0 - 8.3 g/dL   Albumin 4.2 3.5 - 5.2 g/dL   GFR 56.77 (L) >60.00 mL/min   Calcium 9.9 8.4 - 10.5 mg/dL  Lipid panel  Result Value Ref Range   Cholesterol 177 0 - 200 mg/dL   Triglycerides 227.0 (H) 0.0 - 149.0 mg/dL   HDL 43.80 >39.00 mg/dL   VLDL 45.4 (H) 0.0 - 40.0 mg/dL   Total CHOL/HDL Ratio 4    NonHDL 132.99   TSH  Result Value Ref Range   TSH 0.88 0.35 - 5.50 uIU/mL  LDL cholesterol, direct  Result Value Ref Range   Direct LDL 96.0 mg/dL

## 2022-01-14 NOTE — Patient Instructions (Addendum)
It was good to see you today, I will be in touch with your A1c and other labs as soon as possible We will set up a liver/ gallbladder ultrasound for you- please schedule with imaging asap In the meantime use a lower fat diet - if any worsening of your symptoms please seek care!   Flu shot today Suggest getting the shingrix vaccine series at your pharmacy Continue to recommend covid vaccination!

## 2022-01-17 ENCOUNTER — Encounter: Payer: Self-pay | Admitting: Family Medicine

## 2022-01-17 ENCOUNTER — Ambulatory Visit (INDEPENDENT_AMBULATORY_CARE_PROVIDER_SITE_OTHER): Payer: Medicare Other | Admitting: Family Medicine

## 2022-01-17 VITALS — BP 112/72 | HR 81 | Temp 98.0°F | Resp 18 | Ht 62.0 in | Wt 164.6 lb

## 2022-01-17 DIAGNOSIS — E785 Hyperlipidemia, unspecified: Secondary | ICD-10-CM | POA: Diagnosis not present

## 2022-01-17 DIAGNOSIS — Z130 Encounter for screening for diseases of the blood and blood-forming organs and certain disorders involving the immune mechanism: Secondary | ICD-10-CM

## 2022-01-17 DIAGNOSIS — Z1329 Encounter for screening for other suspected endocrine disorder: Secondary | ICD-10-CM

## 2022-01-17 DIAGNOSIS — R1011 Right upper quadrant pain: Secondary | ICD-10-CM | POA: Diagnosis not present

## 2022-01-17 DIAGNOSIS — Z1231 Encounter for screening mammogram for malignant neoplasm of breast: Secondary | ICD-10-CM | POA: Diagnosis not present

## 2022-01-17 DIAGNOSIS — Z23 Encounter for immunization: Secondary | ICD-10-CM | POA: Diagnosis not present

## 2022-01-17 DIAGNOSIS — B3731 Acute candidiasis of vulva and vagina: Secondary | ICD-10-CM | POA: Diagnosis not present

## 2022-01-17 DIAGNOSIS — E1165 Type 2 diabetes mellitus with hyperglycemia: Secondary | ICD-10-CM

## 2022-01-17 LAB — COMPREHENSIVE METABOLIC PANEL
ALT: 21 U/L (ref 0–35)
AST: 16 U/L (ref 0–37)
Albumin: 4.2 g/dL (ref 3.5–5.2)
Alkaline Phosphatase: 68 U/L (ref 39–117)
BUN: 17 mg/dL (ref 6–23)
CO2: 29 mEq/L (ref 19–32)
Calcium: 9.9 mg/dL (ref 8.4–10.5)
Chloride: 99 mEq/L (ref 96–112)
Creatinine, Ser: 1.01 mg/dL (ref 0.40–1.20)
GFR: 56.77 mL/min — ABNORMAL LOW (ref >60.00)
Glucose, Bld: 167 mg/dL — ABNORMAL HIGH (ref 70–99)
Potassium: 4.5 mEq/L (ref 3.5–5.1)
Sodium: 136 mEq/L (ref 135–145)
Total Bilirubin: 0.3 mg/dL (ref 0.2–1.2)
Total Protein: 6.8 g/dL (ref 6.0–8.3)

## 2022-01-17 LAB — CBC
HCT: 38.4 % (ref 36.0–46.0)
Hemoglobin: 12.7 g/dL (ref 12.0–15.0)
MCHC: 33 g/dL (ref 30.0–36.0)
MCV: 84 fl (ref 78.0–100.0)
Platelets: 208 10*3/uL (ref 150.0–400.0)
RBC: 4.57 Mil/uL (ref 3.87–5.11)
RDW: 13.9 % (ref 11.5–15.5)
WBC: 7.7 10*3/uL (ref 4.0–10.5)

## 2022-01-17 LAB — LIPID PANEL
Cholesterol: 177 mg/dL (ref 0–200)
HDL: 43.8 mg/dL (ref >39.00)
NonHDL: 132.99
Total CHOL/HDL Ratio: 4
Triglycerides: 227 mg/dL — ABNORMAL HIGH (ref 0.0–149.0)
VLDL: 45.4 mg/dL — ABNORMAL HIGH (ref 0.0–40.0)

## 2022-01-17 LAB — TSH: TSH: 0.88 u[IU]/mL (ref 0.35–5.50)

## 2022-01-17 LAB — HM MAMMOGRAPHY

## 2022-01-17 LAB — LDL CHOLESTEROL, DIRECT: Direct LDL: 96 mg/dL

## 2022-01-17 LAB — HEMOGLOBIN A1C: Hgb A1c MFr Bld: 8.8 % — ABNORMAL HIGH (ref 4.6–6.5)

## 2022-01-25 ENCOUNTER — Ambulatory Visit (HOSPITAL_BASED_OUTPATIENT_CLINIC_OR_DEPARTMENT_OTHER)
Admission: RE | Admit: 2022-01-25 | Discharge: 2022-01-25 | Disposition: A | Discharge status: Home / Self Care | Payer: Medicare Other | Source: Ambulatory Visit | Attending: Family Medicine | Admitting: Family Medicine

## 2022-01-25 ENCOUNTER — Encounter: Payer: Self-pay | Admitting: Family Medicine

## 2022-01-25 DIAGNOSIS — R1011 Right upper quadrant pain: Secondary | ICD-10-CM | POA: Insufficient documentation

## 2022-01-28 LAB — HM PAP SMEAR
HPV 16/18/45 genotyping: NEGATIVE
HPV, high-risk: POSITIVE

## 2022-02-04 ENCOUNTER — Ambulatory Visit (HOSPITAL_BASED_OUTPATIENT_CLINIC_OR_DEPARTMENT_OTHER): Payer: Medicare Other

## 2022-02-11 ENCOUNTER — Other Ambulatory Visit (HOSPITAL_BASED_OUTPATIENT_CLINIC_OR_DEPARTMENT_OTHER): Payer: Medicare Other

## 2022-02-11 ENCOUNTER — Encounter (HOSPITAL_BASED_OUTPATIENT_CLINIC_OR_DEPARTMENT_OTHER): Payer: Self-pay

## 2022-02-11 ENCOUNTER — Ambulatory Visit (HOSPITAL_BASED_OUTPATIENT_CLINIC_OR_DEPARTMENT_OTHER)
Admission: RE | Admit: 2022-02-11 | Discharge: 2022-02-11 | Disposition: A | Discharge status: Home / Self Care | Payer: Medicare Other | Source: Ambulatory Visit | Attending: Family Medicine | Admitting: Family Medicine

## 2022-02-11 DIAGNOSIS — I700 Atherosclerosis of aorta: Secondary | ICD-10-CM | POA: Diagnosis not present

## 2022-02-11 DIAGNOSIS — R1011 Right upper quadrant pain: Secondary | ICD-10-CM | POA: Insufficient documentation

## 2022-02-11 DIAGNOSIS — R109 Unspecified abdominal pain: Secondary | ICD-10-CM | POA: Diagnosis not present

## 2022-02-11 MED ORDER — IOHEXOL 300 MG/ML  SOLN
100.0000 mL | Freq: Once | INTRAMUSCULAR | Status: AC | PRN
  Administered 2022-02-11: 100 mL via INTRAVENOUS

## 2022-02-13 ENCOUNTER — Encounter: Payer: Self-pay | Admitting: Family Medicine

## 2022-02-15 NOTE — Telephone Encounter (Signed)
Called pt back as I was concerned.  She notes she is doing much better.  She is taking trulicity 4.5 and metformin.  Wonders if ok to use lantus prn- advised ok to use 10 units if her glucose is 300 or higher She will try to do better with her diet and eat less sugar She will let me know if not continuing to feel better

## 2022-02-24 ENCOUNTER — Other Ambulatory Visit: Payer: Self-pay | Admitting: Family Medicine

## 2022-02-25 ENCOUNTER — Encounter: Payer: Self-pay | Admitting: Family Medicine

## 2022-03-12 ENCOUNTER — Telehealth: Payer: Self-pay | Admitting: *Deleted

## 2022-03-12 NOTE — Chronic Care Management (AMB) (Signed)
  Care Coordination   Note   03/12/2022 Name: Jodi Williams MRN: [id number] DOB: Feb 01, 1951  Jodi Williams is a 71 y.o. year old female who sees Copland, Gay Filler, MD for primary care. I reached out to [employer] by phone today to offer care coordination services.  Jodi Williams was given information about Care Coordination services today including:   The Care Coordination services include support from the care team which includes your Nurse Coordinator, Clinical Social Worker, or Pharmacist.  The Care Coordination team is here to help remove barriers to the health concerns and goals most important to you. Care Coordination services are voluntary, and the patient may decline or stop services at any time by request to their care team member.   Care Coordination Consent Status: Patient agreed to services and verbal consent obtained.   Follow up plan:  Telephone appointment with care coordination team member scheduled for:  03/18/2022  Encounter Outcome:  Pt. Scheduled  Julian Hy, [place] Direct Dial: [phone]

## 2022-03-18 ENCOUNTER — Ambulatory Visit: Payer: Self-pay

## 2022-03-18 NOTE — Patient Outreach (Signed)
  Care Coordination   Initial Visit Note   03/18/2022 Name: ZOOEY SCHREURS MRN: [id number] DOB: 01-Jan-1951  Joneen Boers Lupinacci is a 71 y.o. year old female who sees Copland, Gay Filler, MD for primary care. I spoke with  Deeann Saint by phone today.  What matters to the patients health and wellness today?  Ms. Wunder denies any care coordination, disease management or resource needs. She reports her A1C has come down and she is working on improving it more. She reports she knows what to do to manage her blood sugar, but admits that she will indulge during the holidays. She declines care coordination services at this time.   Goals Addressed             This Visit's Progress    COMPLETED: Care Coordination Activities-no follow up required       Care Coordination Interventions: Discussed Care Coordination Program Discussed Holiday meal planning and encouraged to avoid concentrated sweets and complex carbohydrates. Suggestion for Lilly's chocolate and triple zero yogurt to see if that could help with sugar cravings. Encouraged to contact RNCM(contact number provided) and or primary care provider if care coordination needs in the future.        SDOH assessments and interventions completed:  Yes  SDOH Interventions Today    Flowsheet Row Most Recent Value  SDOH Interventions   Food Insecurity Interventions Intervention Not Indicated  Housing Interventions Intervention Not Indicated  Transportation Interventions Intervention Not Indicated  Utilities Interventions Intervention Not Indicated     Care Coordination Interventions Activated:  Yes  Care Coordination Interventions:  Yes, provided   Follow up plan: No further intervention required.   Encounter Outcome:  Pt. Visit Completed   Thea Silversmith, RN, MSN, BSN, [place] Coordinator [phone]

## 2022-03-27 ENCOUNTER — Ambulatory Visit (INDEPENDENT_AMBULATORY_CARE_PROVIDER_SITE_OTHER): Payer: Medicare Other | Admitting: *Deleted

## 2022-03-27 DIAGNOSIS — Z0000 Encounter for general adult medical examination without abnormal findings: Secondary | ICD-10-CM

## 2022-03-27 NOTE — Progress Notes (Signed)
Subjective:   Jodi Williams is a 71 y.o. female who presents for Medicare Annual (Subsequent) preventive examination.  I connected with  Jodi Williams on 03/27/22 by a audio enabled telemedicine application and verified that I am speaking with the correct person using two identifiers.  Patient Location: Home  Provider Location: Office/Clinic  I discussed the limitations of evaluation and management by telemedicine. The patient expressed understanding and agreed to proceed.   Review of Systems    Defer to PCP Cardiac Risk Factors include: diabetes mellitus;hypertension;advanced age (>22mn, >>38women)     Objective:    There were no vitals filed for this visit. There is no height or weight on file to calculate BMI.     03/27/2022    8:23 AM 09/22/2020    1:00 PM 09/19/2020    8:15 AM 08/31/2020    1:25 PM 08/30/2019    1:48 PM 09/01/2014    3:27 PM  Advanced Directives  Does Patient Have a Medical Advance Directive? Yes No No Yes;No No No  Type of Advance Directive Living will       Does patient want to make changes to medical advance directive? No - Patient declined No - Patient declined No - Patient declined Yes (MAU/Ambulatory/Procedural Areas - Information given)    Would patient like information on creating a medical advance directive?  No - Patient declined No - Patient declined  No - Patient declined     Current Medications (verified) Outpatient Encounter Medications as of 03/27/2022  Medication Sig   acetaminophen (TYLENOL) 650 MG CR tablet Take 650 mg by mouth every 8 (eight) hours as needed for pain.   albuterol (PROVENTIL HFA;VENTOLIN HFA) 108 (90 BASE) MCG/ACT inhaler Inhale 2 puffs into the lungs every 4 (four) hours as needed for wheezing or shortness of breath (cough, shortness of breath or wheezing.). (Patient taking differently: Inhale 2 puffs into the lungs every 4 (four) hours as needed for wheezing or shortness of breath.)   ALPRAZolam (XANAX) 1 MG tablet TAKE  1 TABLET BY MOUTH AT  BEDTIME AS NEEDED FOR SLEEP   aspirin 81 MG chewable tablet Chew 1 tablet (81 mg total) by mouth 2 (two) times daily.   Calcium Carb-Cholecalciferol (CALCIUM 600 + D PO) Take 2 tablets by mouth at bedtime.   cetirizine (ZYRTEC) 10 MG tablet Take 10 mg by mouth daily.   cholecalciferol (VITAMIN D3) 25 MCG (1000 UNIT) tablet Take 1,000 Units by mouth at bedtime.   CINNAMON PO Take 2 capsules by mouth at bedtime.   citalopram (CELEXA) 20 MG tablet TAKE 1 TABLET BY MOUTH DAILY   Coenzyme Q10 (CO Q-10) 100 MG CAPS Take 100 mg by mouth at bedtime. (Patient not taking: Reported on 01/17/2022)   Dulaglutide (TRULICITY) 4.5 MYO/3.7CHSOPN Inject 4.5 mg as directed once a week.   gabapentin (NEURONTIN) 300 MG capsule TAKE 1 CAPSULE BY MOUTH AT  BEDTIME FOR RESTLESS LEGS.  MAY TITRATE UP TO 3 TIMES  DAILY GRADUALLY IF NEEDED   glucose blood (ACCU-CHEK GUIDE) test strip CHECK BLOOD SUGAR UP TO  TWICE DAILY AS DIRECTED   hydroxypropyl methylcellulose / hypromellose (ISOPTO TEARS / GONIOVISC) 2.5 % ophthalmic solution Place 1 drop into both eyes 3 (three) times daily as needed for dry eyes.   insulin glargine (LANTUS SOLOSTAR) 100 UNIT/ML Solostar Pen Start at 10 units [place] daily and increase as directed   Insulin Pen Needle (PEN NEEDLES) 32G X 4 MM MISC Use one daily  for insulin injection   lisinopril-hydrochlorothiazide (ZESTORETIC) 10-12.5 MG tablet TAKE 1 TABLET BY MOUTH  DAILY   meloxicam (MOBIC) 15 MG tablet TAKE 1 TABLET BY MOUTH DAILY   metFORMIN (GLUCOPHAGE) 1000 MG tablet TAKE 1 TABLET BY MOUTH  TWICE DAILY WITH MEALS   milk thistle 175 MG tablet Take 350 mg by mouth at bedtime.   Multiple Vitamins-Minerals (WOMENS MULTIVITAMIN PO) Take 1 tablet by mouth at bedtime.   nortriptyline (PAMELOR) 10 MG capsule TAKE 2 CAPSULES BY MOUTH AT BEDTIME   omeprazole (PRILOSEC) 40 MG capsule TAKE 1 CAPSULE BY MOUTH  DAILY   pramipexole (MIRAPEX) 0.25 MG tablet TAKE 1 TABLET BY MOUTH IN  THE  EVENING AS NEEDED FOR  LEG PAIN. MAY TAKE 2ND  TABLET IF NEEDED   rizatriptan (MAXALT-MLT) 5 MG disintegrating tablet Take 1 tablet (5 mg total) by mouth as needed. May repeat in 2 hours if needed   rosuvastatin (CRESTOR) 5 MG tablet TAKE 1 TABLET BY MOUTH  DAILY   tiZANidine (ZANAFLEX) 4 MG tablet TAKE 1 TABLET(4 MG) BY MOUTH EVERY 8 HOURS AS NEEDED FOR MUSCLE SPASMS   vitamin B-12 (CYANOCOBALAMIN) 1000 MCG tablet Take 1,000 mcg by mouth daily.   vitamin E 180 MG (400 UNITS) capsule Take 400 Units by mouth at bedtime.   No facility-administered encounter medications on file as of 03/27/2022.    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   Procedure: LEFT KNEE REVISION ARTHROPLASTY;  Surgeon: Mcarthur Rossetti, MD;  Location: [facility];  Service: Orthopedics;  Laterality: Left;   Family History  Problem Relation Age of Onset   Hyperlipidemia Mother    Hypertension Mother    Heart disease Mother    Dementia Mother    Arthritis Mother    Neuropathy Father    Diabetes Father    Kidney disease Father    Social History   Socioeconomic History   Marital status: Married    Spouse name: Not on file   Number of children: 1   Years of education: Not on file   Highest education level: Not on file  Occupational History   Occupation: Electrical engineer  Tobacco Use   Smoking status: Never   Smokeless tobacco: Never  Vaping Use   Vaping Use: Never used   Substance and Sexual Activity   Alcohol use: Not Currently    Alcohol/week: 0.0 standard drinks of alcohol   Drug use: No   Sexual activity: Yes    Birth control/protection: None  Other Topics Concern   Not on file  Social History Narrative   Not on file   Social Determinants of Health   Financial Resource Strain: Low Risk  (08/31/2020)   Overall Financial Resource Strain (CARDIA)    Difficulty of Paying Living Expenses: Not  hard at all  Food Insecurity: No Food Insecurity (03/18/2022)   Hunger Vital Sign    Worried About Running Out of Food in the Last Year: Never true    Ran Out of Food in the Last Year: Never true  Transportation Needs: No Transportation Needs (03/18/2022)   PRAPARE - Hydrologist (Medical): No    Lack of Transportation (Non-Medical): No  Physical Activity: Inactive (08/31/2020)   Exercise Vital Sign    Days of Exercise per Week: 0 days    Minutes of Exercise per Session: 0 min  Stress: No Stress Concern Present (08/31/2020)   [place]    Feeling of Stress : Not at all  Social Connections: Moderately Isolated (08/31/2020)   Social Connection and Isolation Panel [NHANES]    Frequency of Communication with Friends and Family: More than three times a week    Frequency of Social Gatherings with Friends and Family: More than three times a week    Attends Religious Services: Never    Marine scientist or Organizations: No    Attends Music therapist: Never    Marital Status: Married    Tobacco Counseling Counseling given: Not Answered   Clinical Intake:  Pre-visit preparation completed: No  Pain : No/denies pain  How often do you need to have someone help you when you read instructions, pamphlets, or other written materials from your doctor or pharmacy?: 1 - Never  Diabetic? Nutrition Risk Assessment:  Has the patient had any N/V/D within  the last 2 months?  No  Does the patient have any non-healing wounds?  No  Has the patient had any unintentional weight loss or weight gain?  No   Diabetes:  Is the patient diabetic?  Yes  If diabetic, was a CBG obtained today?  No  Did the patient bring in their glucometer from home?   Audio visit How often do you monitor your CBG's? daily.   Financial Strains and Diabetes Management:  Are you having any financial strains with the device, your supplies or your medication? No .  Does the patient want to be seen by Chronic Care Management for management of their diabetes?  No  Would the patient like to be referred to a Nutritionist or for Diabetic Management?  No   Diabetic Exams:  Diabetic Eye Exam: Completed 07/23/21 Diabetic Foot Exam: Overdue, Pt has been advised about the importance in completing this exam. Pt is scheduled for diabetic foot exam on N/a.   Interpreter Needed?: No  Information entered by :: Beatris Ship, [place]   Activities of Daily Living    03/27/2022    8:26 AM  In your present state of health, do you have any difficulty performing the following activities:  Hearing? 0  Vision? 0  Difficulty concentrating or making decisions? 0  Walking or climbing stairs? 0  Dressing or bathing? 0  Doing errands, shopping? 0  Preparing Food and eating ? N  Using the Toilet? N  In the past six months, have you accidently leaked urine? Y  Comment stress incontinence  Do you have problems with loss of bowel control? N  Managing your Medications? N  Managing your Finances? N  Housekeeping or managing your Housekeeping? N    Patient Care Team: Copland, Gay Filler, MD as PCP - General (Family Medicine) Lomax, Marny Lowenstein, MD (Inactive) as Attending Physician (Obstetrics and Gynecology)  Indicate any recent Medical  Services you may have received from other than [facility] providers in the past year (date may be approximate).     Assessment:   This is a routine wellness  examination for [place].  Hearing/Vision screen No results found.  Dietary issues and exercise activities discussed: Current Exercise Habits: Home exercise routine, Type of exercise: Other - see comments (rides bike), Time (Minutes): 50, Frequency (Times/Week): 6 (weather permitting), Weekly Exercise (Minutes/Week): 300, Intensity: Moderate, Exercise limited by: None identified   Goals Addressed   None    Depression Screen    03/27/2022    8:24 AM 03/26/2021    1:52 PM 08/31/2020    1:29 PM 08/30/2019    1:51 PM 04/07/2017    9:30 AM 04/01/2016    1:54 PM 10/18/2015    9:09 AM  PHQ 2/9 Scores  PHQ - 2 Score 0 2 0 0 0 0 0  PHQ- 9 Score  8         Fall Risk    03/27/2022    8:23 AM 08/31/2020    1:27 PM 08/30/2019    1:51 PM 04/07/2017    9:30 AM 04/01/2016    1:54 PM  [place] in the past year? 0 1 0 No No  Number falls in past yr: 0 1 0    Injury with Fall? 0 0 0    Risk for fall due to : No Fall Risks      Follow up Falls evaluation completed Falls prevention discussed Education provided;Falls prevention discussed      FALL RISK PREVENTION PERTAINING TO THE HOME:  Any stairs in or around the home? Yes  If so, are there any without handrails? No  Home free of loose throw rugs in walkways, pet beds, electrical cords, etc? Yes  Adequate lighting in your home to reduce risk of falls? Yes   ASSISTIVE DEVICES UTILIZED TO PREVENT FALLS:  Life alert? No  Use of a cane, walker or w/c? No  Grab bars in the bathroom? Yes  Shower chair or bench in shower? No  Elevated toilet seat or a handicapped toilet? No   TIMED UP AND GO:  Was the test performed?  No, audio visit .    Cognitive Function:        03/27/2022    8:32 AM  6CIT Screen  What Year? 0 points  What month? 0 points  What time? 0 points  Count back from 20 0 points  Months in reverse 0 points  Repeat phrase 2 points  Total Score 2 points    Immunizations Immunization History  Administered  Date(s) Administered   Fluad Quad(high Dose 65+) 06/12/2020, 03/26/2021, 01/17/2022   Influenza, Seasonal, Injecte, Preservative Fre 04/12/2012   Influenza,inj,Quad PF,6+ Mos 04/04/2013, 01/24/2014, 01/18/2015, 03/28/2016, 04/07/2017, 03/18/2018   Influenza,inj,quad, With Preservative 03/13/2017   Pneumococcal Conjugate-13 12/03/2017   Pneumococcal Polysaccharide-23 05/16/2014, 09/08/2019   Td 12/03/2017, 12/03/2017   Tdap 10/14/2007   Zoster, Live 10/18/2015    TDAP status: Up to date  Flu Vaccine status: Up to date  Pneumococcal vaccine status: Up to date  Covid-19 vaccine status: Information provided on how to obtain vaccines.   Qualifies for Shingles Vaccine? Yes   Zostavax completed Yes   Shingrix Completed?: No.    Education has been provided regarding the importance of this vaccine. Patient has been advised to call insurance company to determine out of pocket expense if they have not yet received this vaccine. Advised may also  receive vaccine at local pharmacy or Health Dept. Verbalized acceptance and understanding.  Screening Tests Health Maintenance  Topic Date Due   COVID-19 Vaccine (1) Never done   Zoster Vaccines- Shingrix (1 of 2) Never done   MAMMOGRAM  03/13/2020   Diabetic kidney evaluation - Urine ACR  09/07/2020   Medicare Annual Wellness (AWV)  08/31/2021   FOOT EXAM  03/26/2022   HEMOGLOBIN A1C  07/18/2022   OPHTHALMOLOGY EXAM  07/24/2022   Diabetic kidney evaluation - GFR measurement  01/18/2023   Fecal DNA (Cologuard)  06/29/2023   TETANUS/TDAP  12/04/2027   Pneumonia Vaccine 68+ Years old  Completed   INFLUENZA VACCINE  Completed   DEXA SCAN  Completed   Hepatitis C Screening  Completed   HPV VACCINES  Aged Out    Health Maintenance  Health Maintenance Due  Topic Date Due   COVID-19 Vaccine (1) Never done   Zoster Vaccines- Shingrix (1 of 2) Never done   MAMMOGRAM  03/13/2020   Diabetic kidney evaluation - Urine ACR  09/07/2020   Medicare  Annual Wellness (AWV)  08/31/2021   FOOT EXAM  03/26/2022    Colorectal cancer screening: Type of screening: Cologuard. Completed 06/28/20. Repeat every 3 years  Mammogram status: Completed 01/17/22 Pt reported. Repeat every year  Bone Density status: Pt will contact OBGYN to order  Lung Cancer Screening: (Low Dose CT Chest recommended if Age 53-80 years, 30 pack-year currently smoking OR have quit w/in 15years.) does not qualify.   Additional Screening:  Hepatitis C Screening: does qualify; Completed 01/18/15  Vision Screening: Recommended annual ophthalmology exams for early detection of glaucoma and other disorders of the eye. Is the patient up to date with their annual eye exam?  Yes  Who is the provider or what is the name of the office in which the patient attends annual eye exams? Dr. Bing Plume If pt is not established with a provider, would they like to be referred to a provider to establish care? No .   Dental Screening: Recommended annual dental exams for proper oral hygiene  Community Resource Referral / Chronic Care Management: CRR required this visit?  No   CCM required this visit?  No      Plan:     I have personally reviewed and noted the following in the patient's chart:   Medical and social history Use of alcohol, tobacco or illicit drugs  Current medications and supplements including opioid prescriptions. Patient is not currently taking opioid prescriptions. Functional ability and status Nutritional status Physical activity Advanced directives List of other physicians Hospitalizations, surgeries, and ER visits in previous 12 months Vitals Screenings to include cognitive, depression, and falls Referrals and appointments  In addition, I have reviewed and discussed with patient certain preventive protocols, quality metrics, and best practice recommendations. A written personalized care plan for preventive services as well as general preventive health  recommendations were provided to patient.   Due to this being a telephonic visit, the after visit summary with patients personalized plan was offered to patient via mail or my-chart. Patient would like to access on my-chart.  Beatris Ship, [place]   03/27/2022   Nurse Notes: None

## 2022-03-27 NOTE — Patient Instructions (Signed)
Jodi Williams , Thank you for taking time to come for your Medicare Wellness Visit. I appreciate your ongoing commitment to your health goals. Please review the following plan we discussed and let me know if I can assist you in the future.   These are the goals we discussed:  Goals      Increase physical activity        This is a list of the screening recommended for you and due dates:  Health Maintenance  Topic Date Due   COVID-19 Vaccine (1) Never done   Zoster (Shingles) Vaccine (1 of 2) Never done   Mammogram  03/13/2020   Yearly kidney health urinalysis for diabetes  09/07/2020   Complete foot exam   03/26/2022   Hemoglobin A1C  07/18/2022   Eye exam for diabetics  07/24/2022   Yearly kidney function blood test for diabetes  01/18/2023   Medicare Annual Wellness Visit  03/28/2023   Cologuard (Stool DNA test)  06/29/2023   Tetanus Vaccine  12/04/2027   Pneumonia Vaccine  Completed   Flu Shot  Completed   DEXA scan (bone density measurement)  Completed   Hepatitis C Screening: USPSTF Recommendation to screen - Ages 25-79 yo.  Completed   HPV Vaccine  Aged Out     Next appointment: Follow up in one year for your annual wellness visit.   Preventive Care 69 Years and Older, Female Preventive care refers to lifestyle choices and visits with your health care provider that can promote health and wellness. What does preventive care include? A yearly physical exam. This is also called an annual well check. Dental exams once or twice a year. Routine eye exams. Ask your health care provider how often you should have your eyes checked. Personal lifestyle choices, including: Daily care of your teeth and gums. Regular physical activity. Eating a healthy diet. Avoiding tobacco and drug use. Limiting alcohol use. Practicing safe sex. Taking low-dose aspirin every day. Taking vitamin and mineral supplements as recommended by your health care provider. What happens during an annual  well check? The services and screenings done by your health care provider during your annual well check will depend on your age, overall health, lifestyle risk factors, and family history of disease. Counseling  Your health care provider may ask you questions about your: Alcohol use. Tobacco use. Drug use. Emotional well-being. Home and relationship well-being. Sexual activity. Eating habits. History of falls. Memory and ability to understand (cognition). Work and work Statistician. Reproductive health. Screening  You may have the following tests or measurements: Height, weight, and BMI. Blood pressure. Lipid and cholesterol levels. These may be checked every 5 years, or more frequently if you are over 49 years old. Skin check. Lung cancer screening. You may have this screening every year starting at age 69 if you have a 30-pack-year history of smoking and currently smoke or have quit within the past 15 years. Fecal occult blood test (FOBT) of the stool. You may have this test every year starting at age 69. Flexible sigmoidoscopy or colonoscopy. You may have a sigmoidoscopy every 5 years or a colonoscopy every 10 years starting at age 69. Hepatitis C blood test. Hepatitis B blood test. Sexually transmitted disease (STD) testing. Diabetes screening. This is done by checking your blood sugar (glucose) after you have not eaten for a while (fasting). You may have this done every 1-3 years. Bone density scan. This is done to screen for osteoporosis. You may have this done starting at  age 69. Mammogram. This may be done every 1-2 years. Talk to your health care provider about how often you should have regular mammograms. Talk with your health care provider about your test results, treatment options, and if necessary, the need for more tests. Vaccines  Your health care provider may recommend certain vaccines, such as: Influenza vaccine. This is recommended every year. Tetanus, diphtheria,  and acellular pertussis (Tdap, Td) vaccine. You may need a Td booster every 10 years. Zoster vaccine. You may need this after age 69. Pneumococcal 13-valent conjugate (PCV13) vaccine. One dose is recommended after age 69. Pneumococcal polysaccharide (PPSV23) vaccine. One dose is recommended after age 69. Talk to your health care provider about which screenings and vaccines you need and how often you need them. This information is not intended to replace advice given to you by your health care provider. Make sure you discuss any questions you have with your health care provider. Document Released: 05/26/2015 Document Revised: 01/17/2016 Document Reviewed: 02/28/2015 Elsevier Interactive Patient Education  2017 [place] Prevention in the Home Falls can cause injuries. They can happen to people of all ages. There are many things you can do to make your home safe and to help prevent falls. What can I do on the outside of my home? Regularly fix the edges of walkways and driveways and fix any cracks. Remove anything that might make you trip as you walk through a door, such as a raised step or threshold. Trim any bushes or trees on the path to your home. Use bright outdoor lighting. Clear any walking paths of anything that might make someone trip, such as rocks or tools. Regularly check to see if handrails are loose or broken. Make sure that both sides of any steps have handrails. Any raised decks and porches should have guardrails on the edges. Have any leaves, snow, or ice cleared regularly. Use sand or salt on walking paths during winter. Clean up any spills in your garage right away. This includes oil or grease spills. What can I do in the bathroom? Use night lights. Install grab bars by the toilet and in the tub and shower. Do not use towel bars as grab bars. Use non-skid mats or decals in the tub or shower. If you need to sit down in the shower, use a plastic, non-slip  stool. Keep the floor dry. Clean up any water that spills on the floor as soon as it happens. Remove soap buildup in the tub or shower regularly. Attach bath mats securely with double-sided non-slip rug tape. Do not have throw rugs and other things on the floor that can make you trip. What can I do in the bedroom? Use night lights. Make sure that you have a light by your bed that is easy to reach. Do not use any sheets or blankets that are too big for your bed. They should not hang down onto the floor. Have a firm chair that has side arms. You can use this for support while you get dressed. Do not have throw rugs and other things on the floor that can make you trip. What can I do in the kitchen? Clean up any spills right away. Avoid walking on wet floors. Keep items that you use a lot in easy-to-reach places. If you need to reach something above you, use a strong step stool that has a grab bar. Keep electrical cords out of the way. Do not use floor polish or wax that makes floors  slippery. If you must use wax, use non-skid floor wax. Do not have throw rugs and other things on the floor that can make you trip. What can I do with my stairs? Do not leave any items on the stairs. Make sure that there are handrails on both sides of the stairs and use them. Fix handrails that are broken or loose. Make sure that handrails are as long as the stairways. Check any carpeting to make sure that it is firmly attached to the stairs. Fix any carpet that is loose or worn. Avoid having throw rugs at the top or bottom of the stairs. If you do have throw rugs, attach them to the floor with carpet tape. Make sure that you have a light switch at the top of the stairs and the bottom of the stairs. If you do not have them, ask someone to add them for you. What else can I do to help prevent falls? Wear shoes that: Do not have high heels. Have rubber bottoms. Are comfortable and fit you well. Are closed at the  toe. Do not wear sandals. If you use a stepladder: Make sure that it is fully opened. Do not climb a closed stepladder. Make sure that both sides of the stepladder are locked into place. Ask someone to hold it for you, if possible. Clearly mark and make sure that you can see: Any grab bars or handrails. First and last steps. Where the edge of each step is. Use tools that help you move around (mobility aids) if they are needed. These include: Canes. Walkers. Scooters. Crutches. Turn on the lights when you go into a dark area. Replace any light bulbs as soon as they burn out. Set up your furniture so you have a clear path. Avoid moving your furniture around. If any of your floors are uneven, fix them. If there are any pets around you, be aware of where they are. Review your medicines with your doctor. Some medicines can make you feel dizzy. This can increase your chance of falling. Ask your doctor what other things that you can do to help prevent falls. This information is not intended to replace advice given to you by your health care provider. Make sure you discuss any questions you have with your health care provider. Document Released: 02/23/2009 Document Revised: 10/05/2015 Document Reviewed: 06/03/2014 Elsevier Interactive Patient Education  2017 [employer].

## 2022-03-28 ENCOUNTER — Other Ambulatory Visit: Payer: Self-pay | Admitting: Family Medicine

## 2022-03-28 DIAGNOSIS — F418 Other specified anxiety disorders: Secondary | ICD-10-CM

## 2022-04-13 ENCOUNTER — Other Ambulatory Visit: Payer: Self-pay | Admitting: Family Medicine

## 2022-04-13 DIAGNOSIS — R1013 Epigastric pain: Secondary | ICD-10-CM

## 2022-04-24 ENCOUNTER — Other Ambulatory Visit: Payer: Self-pay | Admitting: Family Medicine

## 2022-04-24 DIAGNOSIS — G2581 Restless legs syndrome: Secondary | ICD-10-CM

## 2022-04-24 DIAGNOSIS — F418 Other specified anxiety disorders: Secondary | ICD-10-CM

## 2022-04-26 DIAGNOSIS — E785 Hyperlipidemia, unspecified: Secondary | ICD-10-CM | POA: Diagnosis not present

## 2022-04-26 DIAGNOSIS — S8264XA Nondisplaced fracture of lateral malleolus of right fibula, initial encounter for closed fracture: Secondary | ICD-10-CM | POA: Diagnosis not present

## 2022-04-26 DIAGNOSIS — Z7984 Long term (current) use of oral hypoglycemic drugs: Secondary | ICD-10-CM | POA: Diagnosis not present

## 2022-04-26 DIAGNOSIS — I10 Essential (primary) hypertension: Secondary | ICD-10-CM | POA: Diagnosis not present

## 2022-04-26 DIAGNOSIS — E119 Type 2 diabetes mellitus without complications: Secondary | ICD-10-CM | POA: Diagnosis not present

## 2022-04-29 ENCOUNTER — Other Ambulatory Visit: Payer: Self-pay | Admitting: Family Medicine

## 2022-04-29 DIAGNOSIS — F418 Other specified anxiety disorders: Secondary | ICD-10-CM

## 2022-04-29 DIAGNOSIS — S82891A Other fracture of right lower leg, initial encounter for closed fracture: Secondary | ICD-10-CM | POA: Diagnosis not present

## 2022-05-02 IMAGING — DX DG CHEST 1V PORT
1 series · 1 of 1 positions shown · non-contrast
Comparison: Most recent radiographs 06/15/2015

CLINICAL DATA: Febrile.  Recent knee surgery.

EXAM:
PORTABLE CHEST 1 VIEW

[chest]
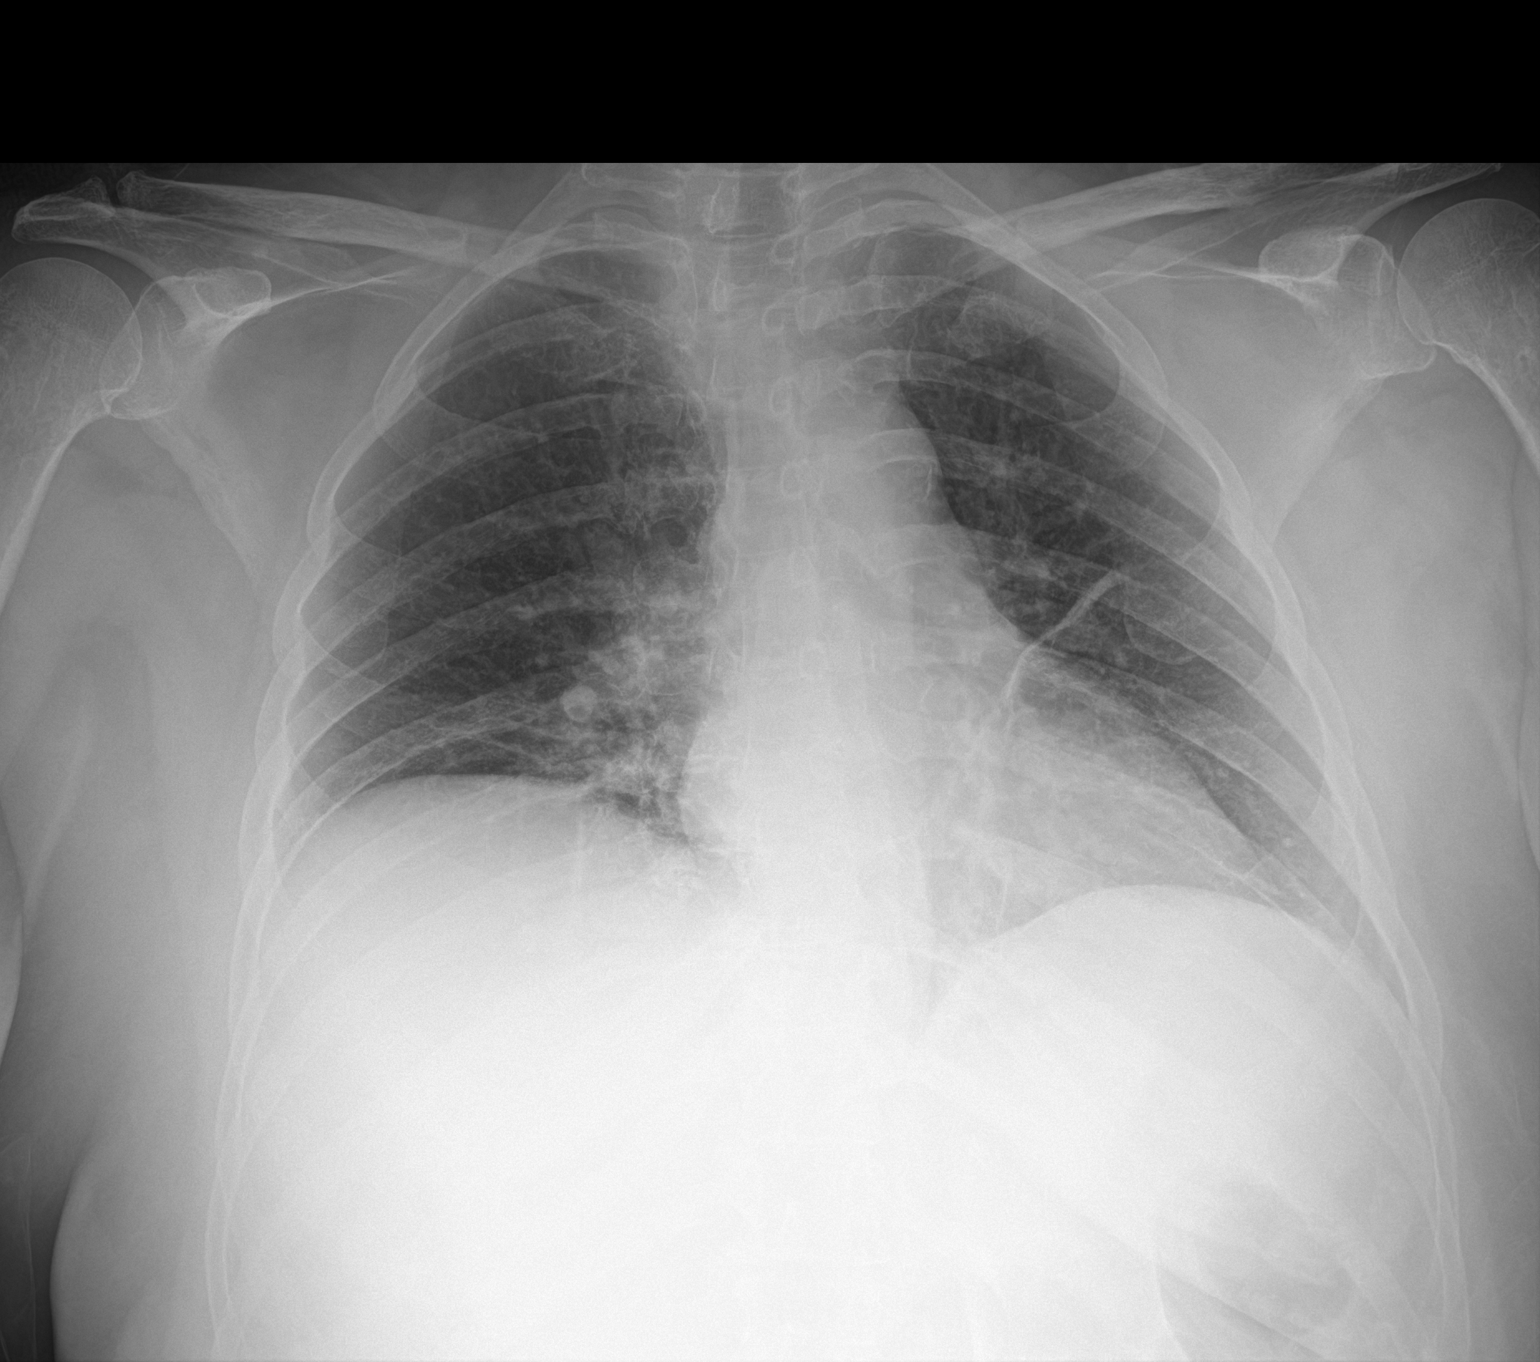

[1 of 1 positions shown; findings below may reference images not displayed]

FINDINGS: Mild chronic elevation of right hemidiaphragm. Streaky bilateral
lung opacities typical of atelectasis. There is no confluent
airspace disease. Normal heart size and mediastinal contours. No
pleural effusion or pneumothorax. No acute osseous abnormalities are
seen.
IMPRESSION: Streaky bilateral lung opacities typical of atelectasis. No
confluent airspace disease.

## 2022-05-30 ENCOUNTER — Other Ambulatory Visit: Payer: Self-pay | Admitting: Family Medicine

## 2022-05-30 MED ORDER — ROSUVASTATIN CALCIUM 5 MG PO TABS: 5.0000 mg | ORAL_TABLET | Freq: Every day | ORAL | 3 refills | Status: DC

## 2022-06-14 ENCOUNTER — Other Ambulatory Visit: Payer: Self-pay | Admitting: Family Medicine

## 2022-06-14 DIAGNOSIS — E119 Type 2 diabetes mellitus without complications: Secondary | ICD-10-CM

## 2022-06-17 ENCOUNTER — Encounter: Payer: Self-pay | Admitting: *Deleted

## 2022-07-03 ENCOUNTER — Other Ambulatory Visit: Payer: Self-pay | Admitting: Family Medicine

## 2022-07-03 DIAGNOSIS — E119 Type 2 diabetes mellitus without complications: Secondary | ICD-10-CM

## 2022-07-04 DIAGNOSIS — L820 Inflamed seborrheic keratosis: Secondary | ICD-10-CM | POA: Diagnosis not present

## 2022-07-04 DIAGNOSIS — C4352 Malignant melanoma of skin of breast: Secondary | ICD-10-CM | POA: Diagnosis not present

## 2022-07-04 DIAGNOSIS — D1801 Hemangioma of skin and subcutaneous tissue: Secondary | ICD-10-CM | POA: Diagnosis not present

## 2022-07-04 DIAGNOSIS — D225 Melanocytic nevi of trunk: Secondary | ICD-10-CM | POA: Diagnosis not present

## 2022-07-04 DIAGNOSIS — D485 Neoplasm of uncertain behavior of skin: Secondary | ICD-10-CM | POA: Diagnosis not present

## 2022-07-04 DIAGNOSIS — C4359 Malignant melanoma of other part of trunk: Secondary | ICD-10-CM | POA: Diagnosis not present

## 2022-07-28 ENCOUNTER — Other Ambulatory Visit: Payer: Self-pay | Admitting: Family Medicine

## 2022-07-28 DIAGNOSIS — F418 Other specified anxiety disorders: Secondary | ICD-10-CM

## 2022-07-29 ENCOUNTER — Encounter: Payer: Self-pay | Admitting: Family Medicine

## 2022-07-29 DIAGNOSIS — E1165 Type 2 diabetes mellitus with hyperglycemia: Secondary | ICD-10-CM

## 2022-08-05 ENCOUNTER — Other Ambulatory Visit: Payer: Self-pay | Admitting: Family Medicine

## 2022-08-05 DIAGNOSIS — E1165 Type 2 diabetes mellitus with hyperglycemia: Secondary | ICD-10-CM

## 2022-08-05 MED ORDER — TRULICITY 4.5 MG/0.5ML ~~LOC~~ SOAJ: 4.5000 mg | SUBCUTANEOUS | 3 refills | Status: DC

## 2022-08-19 ENCOUNTER — Other Ambulatory Visit: Payer: Self-pay | Admitting: Family Medicine

## 2022-08-19 DIAGNOSIS — E119 Type 2 diabetes mellitus without complications: Secondary | ICD-10-CM

## 2022-08-19 NOTE — Patient Instructions (Incomplete)
It was good to see again today, I will be in touch with your lab results Recommend the COVID-19 series, and shingles vaccine at your pharmacy  Please stop by imaging on the ground floor to set up your CT coronary scan Assuming all is well we can see you in about 6 months

## 2022-08-19 NOTE — Progress Notes (Unsigned)
[facility] [address] [place], [place] [address] [phone]  Date:  08/21/2022   Name:  Jodi Williams   DOB:  1950-10-14   MRN:  [id number]  PCP:  Pearline Cables, MD    Chief Complaint: No chief complaint on file.   History of Present Illness:  Jodi Williams is a 72 y.o. very pleasant female patient who presents with the following:  Patient seen today for physical exam- history of hypertension, diabetes, depression, asthma, hyperlipidemia, arthritis status post knee replacement  Most recent visit with myself was in September-at that time her diabetes was under better control with Trulicity 4.5.  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   History of partial knee replacement 11/03/2017   Chronic pain of left knee 11/03/2017   Chronic migraine 03/31/2017   Preop cardiovascular exam 07/12/2015   Dyspnea 07/12/2015   Chest pain 07/12/2015   Restless legs 05/24/2015   Diabetes 08/24/2013   Other and unspecified hyperlipidemia 09/07/2012   HTN (hypertension) 04/12/2012   OA (osteoarthritis) of knee 04/12/2012   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   Depression with anxiety 04/12/2012   H/O gastric ulcer 04/12/2012   ALLERGIC RHINITIS WITH CONJUNCTIVITIS 09/08/2007   ASTHMA 07/20/2007    Past Medical History:  Diagnosis Date   Allergy    Anxiety    Arthritis    Asthma     Cataract    COPD (chronic obstructive pulmonary disease) (HCC)    Depression    Diabetes mellitus (HCC)    Hyperlipidemia    Hypertension    PONV (postoperative nausea and vomiting)    Ulcer     Past Surgical History:  Procedure Laterality Date   BACK SURGERY     BREAST SURGERY     COLONOSCOPY WITH ESOPHAGOGASTRODUODENOSCOPY (EGD)     EYE SURGERY     cataract   HAND SURGERY     KNEE SURGERY     TOTAL KNEE REVISION Left 09/21/2020   Procedure: LEFT KNEE REVISION ARTHROPLASTY;  Surgeon: Kathryne Hitch, MD;  Location: [facility] OR;  Service: Orthopedics;  Laterality: Left;    Social History   Tobacco Use   Smoking status: Never   Smokeless tobacco: Never  Vaping Use   Vaping Use: Never used  Substance Use Topics   Alcohol use: Not Currently    Alcohol/week: 0.0 standard drinks of alcohol   Drug use: No    Family History  Problem Relation Age of Onset   Hyperlipidemia Mother    Hypertension Mother    Heart disease Mother    Dementia Mother    Arthritis Mother    Neuropathy Father    Diabetes Father    Kidney disease Father     Allergies  Allergen Reactions   Omnicef [Cefdinir] Rash    Medication list has been reviewed and updated.  Current Outpatient Medications on File Prior to  Visit  Medication Sig Dispense Refill   ACCU-CHEK GUIDE test strip CHECK BLOOD SUGAR UP TO TWICE  DAILY AS DIRECTED 200 strip 3   acetaminophen (TYLENOL) 650 MG CR tablet Take 650 mg by mouth every 8 (eight) hours as needed for pain.     albuterol (PROVENTIL HFA;VENTOLIN HFA) 108 (90 BASE) MCG/ACT inhaler Inhale 2 puffs into the lungs every 4 (four) hours as needed for wheezing or shortness of breath (cough, shortness of breath or wheezing.). (Patient taking differently: Inhale 2 puffs into the lungs every 4 (four) hours as needed for wheezing or shortness of breath.) 1 Inhaler 1   ALPRAZolam (XANAX) 1 MG tablet TAKE 1 TABLET BY MOUTH AT  BEDTIME AS NEEDED FOR SLEEP 30 tablet 0    aspirin 81 MG chewable tablet Chew 1 tablet (81 mg total) by mouth 2 (two) times daily. 60 tablet 0   Calcium Carb-Cholecalciferol (CALCIUM 600 + D PO) Take 2 tablets by mouth at bedtime.     cetirizine (ZYRTEC) 10 MG tablet Take 10 mg by mouth daily.     cholecalciferol (VITAMIN D3) 25 MCG (1000 UNIT) tablet Take 1,000 Units by mouth at bedtime.     CINNAMON PO Take 2 capsules by mouth at bedtime.     citalopram (CELEXA) 20 MG tablet TAKE 1 TABLET BY MOUTH DAILY 90 tablet 3   Coenzyme Q10 (CO Q-10) 100 MG CAPS Take 100 mg by mouth at bedtime. (Patient not taking: Reported on 01/17/2022)     Dulaglutide (TRULICITY) 4.5 MG/0.5ML SOPN Inject 4.5 mg as directed once a week. 2 mL 3   gabapentin (NEURONTIN) 300 MG capsule TAKE 1 CAPSULE BY MOUTH AT  BEDTIME FOR RESTLESS LEGS. MAY  TITRATE UP TO 3 TIMES DAILY  GRADUALLY IF NEEDED 270 capsule 3   hydroxypropyl methylcellulose / hypromellose (ISOPTO TEARS / GONIOVISC) 2.5 % ophthalmic solution Place 1 drop into both eyes 3 (three) times daily as needed for dry eyes.     insulin glargine (LANTUS SOLOSTAR) 100 UNIT/ML Solostar Pen Start at 10 units [place] daily and increase as directed 15 mL 3   Insulin Pen Needle (PEN NEEDLES) 32G X 4 MM MISC Use one daily for insulin injection 100 each 2   lisinopril-hydrochlorothiazide (ZESTORETIC) 10-12.5 MG tablet TAKE 1 TABLET BY MOUTH  DAILY 90 tablet 3   meloxicam (MOBIC) 15 MG tablet TAKE 1 TABLET BY MOUTH DAILY 90 tablet 3   metFORMIN (GLUCOPHAGE) 1000 MG tablet TAKE 1 TABLET BY MOUTH TWICE  DAILY WITH MEALS 180 tablet 0   milk thistle 175 MG tablet Take 350 mg by mouth at bedtime.     Multiple Vitamins-Minerals (WOMENS MULTIVITAMIN PO) Take 1 tablet by mouth at bedtime.     nortriptyline (PAMELOR) 10 MG capsule TAKE 2 CAPSULES BY MOUTH AT  BEDTIME 180 capsule 3   omeprazole (PRILOSEC) 40 MG capsule TAKE 1 CAPSULE BY MOUTH DAILY 90 capsule 3   pramipexole (MIRAPEX) 0.25 MG tablet TAKE 1 TABLET BY MOUTH IN THE  EVENING  AS NEEDED FOR LEG PAIN.  MAY TAKE 2ND TABLET IF NEEDED 180 tablet 3   rizatriptan (MAXALT-MLT) 5 MG disintegrating tablet Take 1 tablet (5 mg total) by mouth as needed. May repeat in 2 hours if needed 15 tablet 5   rosuvastatin (CRESTOR) 5 MG tablet Take 1 tablet (5 mg total) by mouth daily. 90 tablet 3   tiZANidine (ZANAFLEX) 4 MG tablet TAKE 1 TABLET(4 MG) BY MOUTH EVERY 8 HOURS AS NEEDED FOR  MUSCLE SPASMS 30 tablet 1   vitamin B-12 (CYANOCOBALAMIN) 1000 MCG tablet Take 1,000 mcg by mouth daily.     vitamin E 180 MG (400 UNITS) capsule Take 400 Units by mouth at bedtime.     No current facility-administered medications on file prior to visit.    Review of Systems:  As per HPI- otherwise negative.   Physical Examination: There were no vitals filed for this visit. There were no vitals filed for this visit. There is no height or weight on file to calculate BMI. Ideal Body Weight:    GEN: no acute distress. HEENT: Atraumatic, Normocephalic.  Ears and Nose: No external deformity. CV: RRR, No M/G/R. No JVD. No thrill. No extra heart sounds. PULM: CTA B, no wheezes, crackles, rhonchi. No retractions. No resp. distress. No accessory muscle use. ABD: S, NT, ND, +BS. No rebound. No HSM. EXTR: No c/c/e PSYCH: Normally interactive. Conversant.  Foot exam  Assessment and Plan: *** Physical exam today.  Encouraged healthy diet and exercise routine Will plan further follow- up pending labs.  Signed Abbe Amsterdam, MD

## 2022-08-21 ENCOUNTER — Ambulatory Visit (HOSPITAL_BASED_OUTPATIENT_CLINIC_OR_DEPARTMENT_OTHER)
Admission: RE | Admit: 2022-08-21 | Discharge: 2022-08-21 | Disposition: A | Discharge status: Home / Self Care | Payer: Medicare Other | Source: Ambulatory Visit | Attending: Family Medicine | Admitting: Family Medicine

## 2022-08-21 ENCOUNTER — Ambulatory Visit (INDEPENDENT_AMBULATORY_CARE_PROVIDER_SITE_OTHER): Payer: Medicare Other | Admitting: Family Medicine

## 2022-08-21 ENCOUNTER — Encounter: Payer: Self-pay | Admitting: Family Medicine

## 2022-08-21 VITALS — BP 124/72 | HR 94 | Temp 97.9°F | Resp 18 | Ht 62.0 in | Wt 165.8 lb

## 2022-08-21 DIAGNOSIS — Z0000 Encounter for general adult medical examination without abnormal findings: Secondary | ICD-10-CM | POA: Diagnosis not present

## 2022-08-21 DIAGNOSIS — I10 Essential (primary) hypertension: Secondary | ICD-10-CM

## 2022-08-21 DIAGNOSIS — E785 Hyperlipidemia, unspecified: Secondary | ICD-10-CM | POA: Diagnosis not present

## 2022-08-21 DIAGNOSIS — Z1329 Encounter for screening for other suspected endocrine disorder: Secondary | ICD-10-CM

## 2022-08-21 DIAGNOSIS — E1165 Type 2 diabetes mellitus with hyperglycemia: Secondary | ICD-10-CM

## 2022-08-21 DIAGNOSIS — F418 Other specified anxiety disorders: Secondary | ICD-10-CM

## 2022-08-21 DIAGNOSIS — Z130 Encounter for screening for diseases of the blood and blood-forming organs and certain disorders involving the immune mechanism: Secondary | ICD-10-CM

## 2022-08-21 LAB — COMPREHENSIVE METABOLIC PANEL
ALT: 30 U/L (ref 0–35)
AST: 20 U/L (ref 0–37)
Albumin: 4.3 g/dL (ref 3.5–5.2)
Alkaline Phosphatase: 71 U/L (ref 39–117)
BUN: 22 mg/dL (ref 6–23)
CO2: 26 mEq/L (ref 19–32)
Calcium: 9.9 mg/dL (ref 8.4–10.5)
Chloride: 99 mEq/L (ref 96–112)
Creatinine, Ser: 1.02 mg/dL (ref 0.40–1.20)
GFR: 55.87 mL/min — ABNORMAL LOW (ref >60.00)
Glucose, Bld: 284 mg/dL — ABNORMAL HIGH (ref 70–99)
Potassium: 4.7 mEq/L (ref 3.5–5.1)
Sodium: 135 mEq/L (ref 135–145)
Total Bilirubin: 0.3 mg/dL (ref 0.2–1.2)
Total Protein: 6.7 g/dL (ref 6.0–8.3)

## 2022-08-21 LAB — LIPID PANEL
Cholesterol: 212 mg/dL — ABNORMAL HIGH (ref 0–200)
HDL: 43.1 mg/dL (ref >39.00)
NonHDL: 168.54
Total CHOL/HDL Ratio: 5
Triglycerides: 339 mg/dL — ABNORMAL HIGH (ref 0.0–149.0)
VLDL: 67.8 mg/dL — ABNORMAL HIGH (ref 0.0–40.0)

## 2022-08-21 LAB — CBC
HCT: 37.8 % (ref 36.0–46.0)
Hemoglobin: 12.6 g/dL (ref 12.0–15.0)
MCHC: 33.2 g/dL (ref 30.0–36.0)
MCV: 82.8 fl (ref 78.0–100.0)
Platelets: 212 10*3/uL (ref 150.0–400.0)
RBC: 4.57 Mil/uL (ref 3.87–5.11)
RDW: 14.2 % (ref 11.5–15.5)
WBC: 8.6 10*3/uL (ref 4.0–10.5)

## 2022-08-21 LAB — HEMOGLOBIN A1C: Hgb A1c MFr Bld: 10.8 % — ABNORMAL HIGH (ref 4.6–6.5)

## 2022-08-21 LAB — TSH: TSH: 0.95 u[IU]/mL (ref 0.35–5.50)

## 2022-08-21 LAB — MICROALBUMIN / CREATININE URINE RATIO
Creatinine,U: 97.8 mg/dL
Microalb Creat Ratio: 2.2 mg/g (ref 0.0–30.0)
Microalb, Ur: 2.1 mg/dL — ABNORMAL HIGH (ref 0.0–1.9)

## 2022-08-21 LAB — LDL CHOLESTEROL, DIRECT: Direct LDL: 112 mg/dL

## 2022-08-21 MED ORDER — ALPRAZOLAM 1 MG PO TABS: 1.0000 mg | ORAL_TABLET | Freq: Every evening | ORAL | 1 refills | Status: DC | PRN

## 2022-08-22 ENCOUNTER — Encounter: Payer: Self-pay | Admitting: Family Medicine

## 2022-08-22 MED ORDER — GLIPIZIDE ER 5 MG PO TB24: 5.0000 mg | ORAL_TABLET | Freq: Every day | ORAL | 3 refills | Status: DC

## 2022-08-22 MED ORDER — ROSUVASTATIN CALCIUM 20 MG PO TABS: 20.0000 mg | ORAL_TABLET | Freq: Every day | ORAL | 3 refills | Status: DC

## 2022-08-29 ENCOUNTER — Other Ambulatory Visit: Payer: Self-pay | Admitting: Family Medicine

## 2022-08-29 DIAGNOSIS — C4359 Malignant melanoma of other part of trunk: Secondary | ICD-10-CM | POA: Diagnosis not present

## 2022-08-29 DIAGNOSIS — C4352 Malignant melanoma of skin of breast: Secondary | ICD-10-CM | POA: Diagnosis not present

## 2022-08-29 DIAGNOSIS — F418 Other specified anxiety disorders: Secondary | ICD-10-CM

## 2022-08-29 MED ORDER — ALPRAZOLAM 1 MG PO TABS: 1.0000 mg | ORAL_TABLET | Freq: Every evening | ORAL | 1 refills | Status: DC | PRN

## 2022-09-10 ENCOUNTER — Other Ambulatory Visit (HOSPITAL_BASED_OUTPATIENT_CLINIC_OR_DEPARTMENT_OTHER): Payer: Self-pay

## 2022-09-10 MED ORDER — TRULICITY 4.5 MG/0.5ML ~~LOC~~ SOAJ
4.5000 mg | SUBCUTANEOUS | 3 refills | Status: DC
  Filled 2022-09-10: qty 2, 28d supply, fill #0

## 2022-09-10 MED ORDER — TRULICITY 4.5 MG/0.5ML ~~LOC~~ SOAJ: 4.5000 mg | SUBCUTANEOUS | 3 refills | Status: DC

## 2022-09-10 NOTE — Addendum Note (Signed)
Addended by: Abbe Amsterdam C on: 09/10/2022 05:14 PM   Modules accepted: Orders

## 2022-09-18 ENCOUNTER — Other Ambulatory Visit (HOSPITAL_BASED_OUTPATIENT_CLINIC_OR_DEPARTMENT_OTHER): Payer: Self-pay

## 2022-10-21 ENCOUNTER — Other Ambulatory Visit: Payer: Self-pay | Admitting: Family Medicine

## 2022-10-21 DIAGNOSIS — E119 Type 2 diabetes mellitus without complications: Secondary | ICD-10-CM

## 2022-11-21 ENCOUNTER — Ambulatory Visit: Payer: Medicare Other | Admitting: Physician Assistant

## 2022-11-29 NOTE — Patient Instructions (Incomplete)
Good to see you again today- I will be in touch with your A1c asap I am glad the Greggory Keen seems to be working well for you!  We can go up on the dose if needed  For your back pain- let's get some x-rays today I will also give you an order to add treatment for your back to your PT order  Can use the muscle relaxer as needed but be cautious of sedation Please consider getting the shingles vaccine series at your pharmacy if not done already

## 2022-11-29 NOTE — Progress Notes (Signed)
[facility] [address] [place], [place] [address] [phone]  Date:  12/04/2022   Name:  Jodi Williams   DOB:  February 10, 1951   MRN:  [id number]  PCP:  Pearline Cables, MD    Chief Complaint: No chief complaint on file.   History of Present Illness:  Jodi Williams is a 72 y.o. 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   Status post revision of total replacement of left knee 09/21/2020   History of partial knee replacement 11/03/2017   Chronic pain of left knee 11/03/2017   Chronic migraine 03/31/2017   Preop cardiovascular exam 07/12/2015   Dyspnea 07/12/2015   Chest pain 07/12/2015   Restless legs 05/24/2015   Diabetes (HCC) 08/24/2013   Other and unspecified hyperlipidemia 09/07/2012   HTN (hypertension) 04/12/2012   OA (osteoarthritis) of knee 04/12/2012   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   Depression with anxiety 04/12/2012   H/O gastric ulcer 04/12/2012   ALLERGIC RHINITIS WITH CONJUNCTIVITIS 09/08/2007   ASTHMA 07/20/2007    Past Medical History:  Diagnosis Date   Allergy    Anxiety    Arthritis    Asthma    Cataract    COPD (chronic obstructive pulmonary disease) (HCC)    Depression    Diabetes mellitus (HCC)    Hyperlipidemia    Hypertension    PONV (postoperative nausea and vomiting)    Ulcer     Past  Surgical History:  Procedure Laterality Date   BACK SURGERY     BREAST SURGERY     COLONOSCOPY WITH ESOPHAGOGASTRODUODENOSCOPY (EGD)     EYE SURGERY     cataract   HAND SURGERY     KNEE SURGERY     TOTAL KNEE REVISION Left 09/21/2020   Procedure: LEFT KNEE REVISION ARTHROPLASTY;  Surgeon: Kathryne Hitch, MD;  Location: [facility] OR;  Service: Orthopedics;  Laterality: Left;    Social History   Tobacco Use   Smoking status: Never   Smokeless tobacco: Never  Vaping Use   Vaping status: Never Used  Substance Use Topics   Alcohol use: Not Currently    Alcohol/week: 0.0 standard drinks of alcohol   Drug use: No    Family History  Problem Relation Age of Onset   Hyperlipidemia Mother    Hypertension Mother    Heart disease Mother    Dementia Mother    Arthritis Mother    Neuropathy Father    Diabetes Father    Kidney disease Father     Allergies  Allergen Reactions   Omnicef [Cefdinir] Rash    Medication list has been reviewed and updated.  Current Outpatient Medications on File Prior to Visit  Medication Sig Dispense Refill   ACCU-CHEK GUIDE test strip CHECK BLOOD SUGAR UP TO TWICE  DAILY AS DIRECTED 200 strip 3  acetaminophen (TYLENOL) 650 MG CR tablet Take 650 mg by mouth every 8 (eight) hours as needed for pain.     albuterol (PROVENTIL HFA;VENTOLIN HFA) 108 (90 BASE) MCG/ACT inhaler Inhale 2 puffs into the lungs every 4 (four) hours as needed for wheezing or shortness of breath (cough, shortness of breath or wheezing.). (Patient taking differently: Inhale 2 puffs into the lungs every 4 (four) hours as needed for wheezing or shortness of breath.) 1 Inhaler 1   ALPRAZolam (XANAX) 1 MG tablet Take 1 tablet (1 mg total) by mouth at bedtime as needed. for sleep 90 tablet 1   aspirin 81 MG chewable tablet Chew 1 tablet (81 mg total) by mouth 2 (two) times daily. 60 tablet 0   Calcium Carb-Cholecalciferol (CALCIUM 600 + D PO) Take 2 tablets by mouth at bedtime.      cetirizine (ZYRTEC) 10 MG tablet Take 10 mg by mouth daily.     cholecalciferol (VITAMIN D3) 25 MCG (1000 UNIT) tablet Take 1,000 Units by mouth at bedtime.     CINNAMON PO Take 2 capsules by mouth at bedtime.     citalopram (CELEXA) 20 MG tablet TAKE 1 TABLET BY MOUTH DAILY 90 tablet 3   Coenzyme Q10 (CO Q-10) 100 MG CAPS Take 100 mg by mouth at bedtime.     Dulaglutide (TRULICITY) 4.5 MG/0.5ML SOPN Inject 4.5 mg as directed once a week. 6 mL 3   gabapentin (NEURONTIN) 300 MG capsule TAKE 1 CAPSULE BY MOUTH AT  BEDTIME FOR RESTLESS LEGS. MAY  TITRATE UP TO 3 TIMES DAILY  GRADUALLY IF NEEDED 270 capsule 3   glipiZIDE (GLUCOTROL XL) 5 MG 24 hr tablet Take 1 tablet (5 mg total) by mouth daily with breakfast. 90 tablet 3   hydroxypropyl methylcellulose / hypromellose (ISOPTO TEARS / GONIOVISC) 2.5 % ophthalmic solution Place 1 drop into both eyes 3 (three) times daily as needed for dry eyes.     insulin glargine (LANTUS SOLOSTAR) 100 UNIT/ML Solostar Pen Start at 10 units [place] daily and increase as directed 15 mL 3   Insulin Pen Needle (PEN NEEDLES) 32G X 4 MM MISC Use one daily for insulin injection 100 each 2   lisinopril-hydrochlorothiazide (ZESTORETIC) 10-12.5 MG tablet TAKE 1 TABLET BY MOUTH  DAILY 90 tablet 3   meloxicam (MOBIC) 15 MG tablet TAKE 1 TABLET BY MOUTH DAILY 90 tablet 3   metFORMIN (GLUCOPHAGE) 1000 MG tablet Take 1 tablet (1,000 mg total) by mouth 2 (two) times daily with a meal. 180 tablet 0   milk thistle 175 MG tablet Take 350 mg by mouth at bedtime.     Multiple Vitamins-Minerals (WOMENS MULTIVITAMIN PO) Take 1 tablet by mouth at bedtime.     nortriptyline (PAMELOR) 10 MG capsule TAKE 2 CAPSULES BY MOUTH AT  BEDTIME 180 capsule 3   omeprazole (PRILOSEC) 40 MG capsule TAKE 1 CAPSULE BY MOUTH DAILY 90 capsule 3   pramipexole (MIRAPEX) 0.25 MG tablet TAKE 1 TABLET BY MOUTH IN THE  EVENING AS NEEDED FOR LEG PAIN.  MAY TAKE 2ND TABLET IF NEEDED 180 tablet 3   rizatriptan (MAXALT-MLT)  5 MG disintegrating tablet Take 1 tablet (5 mg total) by mouth as needed. May repeat in 2 hours if needed 15 tablet 5   rosuvastatin (CRESTOR) 20 MG tablet Take 1 tablet (20 mg total) by mouth daily. 90 tablet 3   tiZANidine (ZANAFLEX) 4 MG tablet TAKE 1 TABLET(4 MG) BY MOUTH EVERY 8 HOURS AS NEEDED FOR MUSCLE SPASMS  30 tablet 1   vitamin B-12 (CYANOCOBALAMIN) 1000 MCG tablet Take 1,000 mcg by mouth daily.     vitamin E 180 MG (400 UNITS) capsule Take 400 Units by mouth at bedtime.     No current facility-administered medications on file prior to visit.    Review of Systems:  As per HPI- otherwise negative.   Physical Examination: There were no vitals filed for this visit. There were no vitals filed for this visit. There is no height or weight on file to calculate BMI. Ideal Body Weight:    GEN: no acute distress. HEENT: Atraumatic, Normocephalic.  Ears and Nose: No external deformity. CV: RRR, No M/G/R. No JVD. No thrill. No extra heart sounds. PULM: CTA B, no wheezes, crackles, rhonchi. No retractions. No resp. distress. No accessory muscle use. ABD: S, NT, ND, +BS. No rebound. No HSM. EXTR: No c/c/e PSYCH: Normally interactive. Conversant.    Assessment and Plan: ***  Signed Abbe Amsterdam, MD

## 2022-12-02 ENCOUNTER — Encounter: Payer: Self-pay | Admitting: Physician Assistant

## 2022-12-02 ENCOUNTER — Other Ambulatory Visit: Payer: Self-pay

## 2022-12-02 ENCOUNTER — Ambulatory Visit: Payer: Medicare Other | Admitting: Physician Assistant

## 2022-12-02 DIAGNOSIS — M25562 Pain in left knee: Secondary | ICD-10-CM | POA: Diagnosis not present

## 2022-12-02 DIAGNOSIS — M7050 Other bursitis of knee, unspecified knee: Secondary | ICD-10-CM | POA: Diagnosis not present

## 2022-12-02 NOTE — Progress Notes (Signed)
Office Visit Note   Patient: Jodi Williams           Date of Birth: 1950/11/11           MRN: [id number] Visit Date: 12/02/2022              Requested by: Pearline Cables, MD [address] [place],  [place] [address] PCP: Pearline Cables, MD   Assessment & Plan: Visit Diagnoses:  1. Left knee pain, unspecified chronicity     Plan: She is given a prescription for formal therapy to work on range of motion and strengthening particularly her quads.  Include modalities and home exercise program.  She also will apply Voltaren gel over the pes anserinus area of the left knee.  Pain persist or becomes worse she may need further imaging studies.  She will follow-up with [place] in 6 weeks.  Follow-Up Instructions: No follow-ups on file.   Orders:  Orders Placed This Encounter  Procedures   XR Knee 1-2 Views Left   No orders of the defined types were placed in this encounter.     Procedures: No procedures performed   Clinical Data: No additional findings.   Subjective: Chief Complaint  Patient presents with   Left Knee - Pain    HPI Jodi Williams 72 year old female comes in today with left knee pain.  She reports that she fell in a hole in a parking lot injuring the left knee.  This was an November 2023.  States knee pain is not improving.  History of left total knee arthroplasty by Dr. Magnus Ivan 09/21/2020 was doing well with the knee until this injury.  She denies any swelling weakness giving way.  Knee pain does awaken her at night she does have some throbbing pain.  She is diabetic and has been poorly controlled.  She has been taking Tylenol without any real relief.  Review of Systems   Objective: Vital Signs: There were no vitals taken for this visit.  Physical Exam General: Well-developed well-nourished female no acute distress.  Mood and affect appropriate. Ortho Exam Bilateral knees good range of motion of both knees.  No abnormal warmth erythema or  effusion of either knee.  Left knee tenderness over the pes anserinus area.  Slight opening left knee with varus stressing. Specialty Comments:  No specialty comments available.  Imaging: Left knee views: No acute fracture left total knee arthroplasty components well-seated.  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/21/2020   History of partial knee replacement 11/03/2017   Chronic pain of left knee 11/03/2017   Chronic migraine 03/31/2017   Preop cardiovascular exam 07/12/2015   Dyspnea 07/12/2015   Chest pain 07/12/2015   Restless legs 05/24/2015   Diabetes (HCC) 08/24/2013   Other and unspecified hyperlipidemia 09/07/2012   HTN (hypertension) 04/12/2012   OA (osteoarthritis) of knee 04/12/2012   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   Depression with anxiety 04/12/2012   H/O gastric ulcer 04/12/2012   ALLERGIC RHINITIS WITH CONJUNCTIVITIS 09/08/2007   ASTHMA 07/20/2007   Past Medical History:  Diagnosis Date   Allergy    Anxiety    Arthritis    Asthma    Cataract    COPD (chronic obstructive pulmonary disease) (HCC)    Depression    Diabetes mellitus (HCC)  Hyperlipidemia    Hypertension    PONV (postoperative nausea and vomiting)    Ulcer     Family History  Problem Relation Age of Onset   Hyperlipidemia Mother    Hypertension Mother    Heart disease Mother    Dementia Mother    Arthritis Mother    Neuropathy Father    Diabetes Father    Kidney disease Father     Past Surgical History:  Procedure Laterality Date   BACK SURGERY     BREAST SURGERY     COLONOSCOPY WITH ESOPHAGOGASTRODUODENOSCOPY (EGD)     EYE SURGERY     cataract   HAND SURGERY     KNEE SURGERY     TOTAL KNEE REVISION Left 09/21/2020   Procedure: LEFT KNEE REVISION ARTHROPLASTY;  Surgeon: Kathryne Hitch, MD;   Location: [facility] OR;  Service: Orthopedics;  Laterality: Left;   Social History   Occupational History   Occupation: Financial trader  Tobacco Use   Smoking status: Never   Smokeless tobacco: Never  Vaping Use   Vaping status: Never Used  Substance and Sexual Activity   Alcohol use: Not Currently    Alcohol/week: 0.0 standard drinks of alcohol   Drug use: No   Sexual activity: Yes    Birth control/protection: None

## 2022-12-04 ENCOUNTER — Ambulatory Visit (HOSPITAL_BASED_OUTPATIENT_CLINIC_OR_DEPARTMENT_OTHER)
Admission: RE | Admit: 2022-12-04 | Discharge: 2022-12-04 | Disposition: A | Discharge status: Home / Self Care | Payer: Medicare Other | Source: Ambulatory Visit | Attending: Family Medicine | Admitting: Family Medicine

## 2022-12-04 ENCOUNTER — Encounter: Payer: Self-pay | Admitting: Family Medicine

## 2022-12-04 ENCOUNTER — Ambulatory Visit (INDEPENDENT_AMBULATORY_CARE_PROVIDER_SITE_OTHER): Payer: Medicare Other | Admitting: Family Medicine

## 2022-12-04 VITALS — BP 110/80 | HR 91 | Temp 97.8°F | Resp 18 | Ht 62.0 in | Wt 153.4 lb

## 2022-12-04 DIAGNOSIS — M546 Pain in thoracic spine: Secondary | ICD-10-CM | POA: Insufficient documentation

## 2022-12-04 DIAGNOSIS — M549 Dorsalgia, unspecified: Secondary | ICD-10-CM | POA: Diagnosis not present

## 2022-12-04 DIAGNOSIS — Z7985 Long-term (current) use of injectable non-insulin antidiabetic drugs: Secondary | ICD-10-CM

## 2022-12-04 DIAGNOSIS — E871 Hypo-osmolality and hyponatremia: Secondary | ICD-10-CM

## 2022-12-04 DIAGNOSIS — G8929 Other chronic pain: Secondary | ICD-10-CM | POA: Insufficient documentation

## 2022-12-04 DIAGNOSIS — E1165 Type 2 diabetes mellitus with hyperglycemia: Secondary | ICD-10-CM | POA: Diagnosis not present

## 2022-12-04 DIAGNOSIS — E785 Hyperlipidemia, unspecified: Secondary | ICD-10-CM | POA: Diagnosis not present

## 2022-12-04 DIAGNOSIS — M4317 Spondylolisthesis, lumbosacral region: Secondary | ICD-10-CM | POA: Diagnosis not present

## 2022-12-04 DIAGNOSIS — M5136 Other intervertebral disc degeneration, lumbar region: Secondary | ICD-10-CM | POA: Diagnosis not present

## 2022-12-04 DIAGNOSIS — M5135 Other intervertebral disc degeneration, thoracolumbar region: Secondary | ICD-10-CM | POA: Diagnosis not present

## 2022-12-04 LAB — BASIC METABOLIC PANEL
BUN: 31 mg/dL — ABNORMAL HIGH (ref 6–23)
CO2: 25 mEq/L (ref 19–32)
Calcium: 10.4 mg/dL (ref 8.4–10.5)
Chloride: 95 mEq/L — ABNORMAL LOW (ref 96–112)
Creatinine, Ser: 1.36 mg/dL — ABNORMAL HIGH (ref 0.40–1.20)
GFR: 39.48 mL/min — ABNORMAL LOW (ref >60.00)
Glucose, Bld: 131 mg/dL — ABNORMAL HIGH (ref 70–99)
Potassium: 4.8 mEq/L (ref 3.5–5.1)
Sodium: 131 mEq/L — ABNORMAL LOW (ref 135–145)

## 2022-12-04 LAB — HEMOGLOBIN A1C: Hgb A1c MFr Bld: 8 % — ABNORMAL HIGH (ref 4.6–6.5)

## 2022-12-04 MED ORDER — METHOCARBAMOL 500 MG PO TABS: 500.0000 mg | ORAL_TABLET | Freq: Three times a day (TID) | ORAL | 1 refills | Status: DC | PRN

## 2022-12-05 ENCOUNTER — Encounter: Payer: Self-pay | Admitting: Family Medicine

## 2022-12-05 NOTE — Telephone Encounter (Signed)
Might you know about such form?

## 2022-12-24 ENCOUNTER — Other Ambulatory Visit: Payer: Self-pay | Admitting: Family Medicine

## 2022-12-24 DIAGNOSIS — E119 Type 2 diabetes mellitus without complications: Secondary | ICD-10-CM

## 2023-01-01 ENCOUNTER — Other Ambulatory Visit: Payer: Self-pay | Admitting: Family Medicine

## 2023-01-02 ENCOUNTER — Encounter: Payer: Self-pay | Admitting: Family Medicine

## 2023-01-02 DIAGNOSIS — E785 Hyperlipidemia, unspecified: Secondary | ICD-10-CM

## 2023-01-02 DIAGNOSIS — E1165 Type 2 diabetes mellitus with hyperglycemia: Secondary | ICD-10-CM

## 2023-01-03 MED ORDER — TIRZEPATIDE 10 MG/0.5ML ~~LOC~~ SOAJ
10.0000 mg | SUBCUTANEOUS | 0 refills | Status: DC
Start: 2023-01-03 — End: 2023-04-02

## 2023-01-16 ENCOUNTER — Other Ambulatory Visit (INDEPENDENT_AMBULATORY_CARE_PROVIDER_SITE_OTHER): Payer: Medicare Other

## 2023-01-16 ENCOUNTER — Encounter: Payer: Self-pay | Admitting: Ophthalmology

## 2023-01-16 DIAGNOSIS — E119 Type 2 diabetes mellitus without complications: Secondary | ICD-10-CM | POA: Diagnosis not present

## 2023-01-16 DIAGNOSIS — E871 Hypo-osmolality and hyponatremia: Secondary | ICD-10-CM

## 2023-01-16 DIAGNOSIS — H16223 Keratoconjunctivitis sicca, not specified as Sjogren's, bilateral: Secondary | ICD-10-CM | POA: Diagnosis not present

## 2023-01-16 DIAGNOSIS — H35033 Hypertensive retinopathy, bilateral: Secondary | ICD-10-CM | POA: Diagnosis not present

## 2023-01-16 DIAGNOSIS — Z961 Presence of intraocular lens: Secondary | ICD-10-CM | POA: Diagnosis not present

## 2023-01-16 LAB — HM DIABETES EYE EXAM

## 2023-01-17 ENCOUNTER — Encounter: Payer: Self-pay | Admitting: Family Medicine

## 2023-01-17 LAB — BASIC METABOLIC PANEL WITH GFR
BUN: 22 mg/dL (ref 6–23)
CO2: 30 meq/L (ref 19–32)
Calcium: 10.1 mg/dL (ref 8.4–10.5)
Chloride: 100 meq/L (ref 96–112)
Creatinine, Ser: 1.1 mg/dL (ref 0.40–1.20)
GFR: 50.89 mL/min — ABNORMAL LOW
Glucose, Bld: 132 mg/dL — ABNORMAL HIGH (ref 70–99)
Potassium: 4.3 meq/L (ref 3.5–5.1)
Sodium: 138 meq/L (ref 135–145)

## 2023-01-20 ENCOUNTER — Ambulatory Visit: Payer: Medicare Other | Admitting: Physician Assistant

## 2023-01-20 ENCOUNTER — Other Ambulatory Visit: Payer: Self-pay | Admitting: Family Medicine

## 2023-02-05 ENCOUNTER — Other Ambulatory Visit: Payer: Self-pay | Admitting: Family Medicine

## 2023-02-05 DIAGNOSIS — F418 Other specified anxiety disorders: Secondary | ICD-10-CM

## 2023-02-20 ENCOUNTER — Other Ambulatory Visit: Payer: Self-pay | Admitting: Family Medicine

## 2023-02-20 DIAGNOSIS — R1013 Epigastric pain: Secondary | ICD-10-CM

## 2023-02-24 ENCOUNTER — Other Ambulatory Visit: Payer: Self-pay | Admitting: Family Medicine

## 2023-02-24 DIAGNOSIS — E119 Type 2 diabetes mellitus without complications: Secondary | ICD-10-CM

## 2023-02-25 ENCOUNTER — Encounter: Payer: Self-pay | Admitting: Family Medicine

## 2023-02-25 ENCOUNTER — Other Ambulatory Visit: Payer: Self-pay | Admitting: Family Medicine

## 2023-02-25 ENCOUNTER — Telehealth: Payer: Self-pay | Admitting: Family Medicine

## 2023-02-25 DIAGNOSIS — F418 Other specified anxiety disorders: Secondary | ICD-10-CM

## 2023-02-25 MED ORDER — ALPRAZOLAM 1 MG PO TABS
1.0000 mg | ORAL_TABLET | Freq: Every evening | ORAL | 0 refills | Status: DC | PRN
Start: 2023-02-25 — End: 2023-05-29

## 2023-02-25 NOTE — Addendum Note (Signed)
Addended by: Abbe Amsterdam C on: 02/25/2023 01:49 PM   Modules accepted: Orders

## 2023-02-25 NOTE — Telephone Encounter (Signed)
Pt called to request a refill for Xanax to be sent to  [id number] - [place], [place] - [address] AT [facility] [address], [place] [address].   Additionally, she wanted to ask if pcp can change the dose as she \\explained  that "it's not helping her sleep".   Please advise pt.

## 2023-02-25 NOTE — Telephone Encounter (Signed)
Please see below.

## 2023-03-02 NOTE — Progress Notes (Unsigned)
[facility] [address], [address] [place], [place] [address] [phone]  Date:  03/03/2023   Name:  Jodi Williams   DOB:  [birth year]   MRN:  [id number]  PCP:  Pearline Cables, MD    Chief Complaint: No chief complaint on file.   History of Present Illness:  Jodi Williams is a 72 y.o. very pleasant female patient who presents with the following:  Patient seen today for concern of insomnia-virtual visit today Patient location is home, location is office.  Patient identity confirmed with 2 factors, she gives consent for virtual visit today.  Patient myself are present on the call today.  However, the patient then let me know if she is actually at her home in [place] which means we cannot charge for this visit today.  I had previously let her know she needed to be in [place] for [place] to do a virtual visit Most recent visit with myself was in July- history of hypertension, diabetes, depression, asthma, hyperlipidemia, arthritis status post knee replacement, melanoma   She is taking alprazolam 1 mg at bedtime for sleep, has done so for quite some time She is also taking gabapentin twice a day- am and pm  Have prescribed Mirapex for restless legs in the past- she is taking this  She is also taking nortriptyline at bedtime   Pt notes she gets to sleep ok- however after a couple of hours she will wake up and have dififculty sleeping the rest of the night She will sleep well some nights if she is more tired/ gets more exercise  She notes more difficulty with sleep for maybe the last 6 months.   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   Status post revision of total replacement of left knee 09/21/2020   History of partial knee  replacement 11/03/2017   Chronic pain of left knee 11/03/2017   Chronic migraine 03/31/2017   Preop cardiovascular exam 07/12/2015   Dyspnea 07/12/2015   Chest pain 07/12/2015   Restless legs 05/24/2015   Diabetes (HCC) 08/24/2013   Other and unspecified hyperlipidemia 09/07/2012   HTN (hypertension) 04/12/2012   OA (osteoarthritis) of knee 04/12/2012   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   Depression with anxiety 04/12/2012   H/O gastric ulcer 04/12/2012   ALLERGIC RHINITIS WITH CONJUNCTIVITIS 09/08/2007   ASTHMA 07/20/2007    Past Medical History:  Diagnosis Date   Allergy    Anxiety    Arthritis    Asthma    Cataract    COPD (chronic obstructive pulmonary disease) (HCC)    Depression    Diabetes mellitus (HCC)    Hyperlipidemia    Hypertension    PONV (postoperative nausea and vomiting)    Ulcer     Past Surgical History:  Procedure Laterality Date   BACK SURGERY     BREAST SURGERY     COLONOSCOPY WITH ESOPHAGOGASTRODUODENOSCOPY (EGD)     EYE SURGERY     cataract   HAND SURGERY     KNEE SURGERY     TOTAL KNEE REVISION Left 09/21/2020   Procedure: LEFT KNEE REVISION ARTHROPLASTY;  Surgeon: Kathryne Hitch, MD;  Location: [facility] OR;  Service:  Orthopedics;  Laterality: Left;    Social History   Tobacco Use   Smoking status: Never   Smokeless tobacco: Never  Vaping Use   Vaping status: Never Used  Substance Use Topics   Alcohol use: Not Currently    Alcohol/week: 0.0 standard drinks of alcohol   Drug use: No    Family History  Problem Relation Age of Onset   Hyperlipidemia Mother    Hypertension Mother    Heart disease Mother    Dementia Mother    Arthritis Mother    Neuropathy Father    Diabetes Father    Kidney disease Father     Allergies  Allergen Reactions   Omnicef [Cefdinir] Rash    Medication list has been reviewed and updated.  Current Outpatient Medications on File Prior to Visit  Medication Sig Dispense Refill   ACCU-CHEK  GUIDE test strip CHECK BLOOD SUGAR UP TO TWICE  DAILY AS DIRECTED 200 strip 3   acetaminophen (TYLENOL) 650 MG CR tablet Take 650 mg by mouth every 8 (eight) hours as needed for pain.     albuterol (PROVENTIL HFA;VENTOLIN HFA) 108 (90 BASE) MCG/ACT inhaler Inhale 2 puffs into the lungs every 4 (four) hours as needed for wheezing or shortness of breath (cough, shortness of breath or wheezing.). (Patient taking differently: Inhale 2 puffs into the lungs every 4 (four) hours as needed for wheezing or shortness of breath.) 1 Inhaler 1   ALPRAZolam (XANAX) 1 MG tablet Take 1 tablet (1 mg total) by mouth at bedtime as needed. for sleep 90 tablet 0   aspirin 81 MG chewable tablet Chew 1 tablet (81 mg total) by mouth 2 (two) times daily. 60 tablet 0   Calcium Carb-Cholecalciferol (CALCIUM 600 + D PO) Take 2 tablets by mouth at bedtime.     cetirizine (ZYRTEC) 10 MG tablet Take 10 mg by mouth daily.     cholecalciferol (VITAMIN D3) 25 MCG (1000 UNIT) tablet Take 1,000 Units by mouth at bedtime.     CINNAMON PO Take 2 capsules by mouth at bedtime.     citalopram (CELEXA) 20 MG tablet Take 1 tablet (20 mg total) by mouth daily. 90 tablet 1   Coenzyme Q10 (CO Q-10) 100 MG CAPS Take 100 mg by mouth at bedtime.     gabapentin (NEURONTIN) 300 MG capsule TAKE 1 CAPSULE BY MOUTH AT  BEDTIME FOR RESTLESS LEGS. MAY  TITRATE UP TO 3 TIMES DAILY  GRADUALLY IF NEEDED 270 capsule 3   glipiZIDE (GLUCOTROL XL) 5 MG 24 hr tablet Take 1 tablet (5 mg total) by mouth daily with breakfast. 90 tablet 3   hydroxypropyl methylcellulose / hypromellose (ISOPTO TEARS / GONIOVISC) 2.5 % ophthalmic solution Place 1 drop into both eyes 3 (three) times daily as needed for dry eyes.     Insulin Pen Needle (PEN NEEDLES) 32G X 4 MM MISC Use one daily for insulin injection 100 each 2   lisinopril-hydrochlorothiazide (ZESTORETIC) 10-12.5 MG tablet TAKE 1 TABLET BY MOUTH  DAILY 90 tablet 3   meloxicam (MOBIC) 15 MG tablet TAKE 1 TABLET BY MOUTH  DAILY 90 tablet 3   metFORMIN (GLUCOPHAGE) 1000 MG tablet Take 1 tablet (1,000 mg total) by mouth 2 (two) times daily with a meal. 180 tablet 1   methocarbamol (ROBAXIN) 500 MG tablet Take 1 tablet (500 mg total) by mouth every 8 (eight) hours as needed for muscle spasms. 40 tablet 1   milk thistle 175 MG tablet Take 350 mg  by mouth at bedtime.     Multiple Vitamins-Minerals (WOMENS MULTIVITAMIN PO) Take 1 tablet by mouth at bedtime.     nortriptyline (PAMELOR) 10 MG capsule TAKE 2 CAPSULES BY MOUTH AT  BEDTIME 180 capsule 3   omeprazole (PRILOSEC) 40 MG capsule Take 1 capsule (40 mg total) by mouth daily. 90 capsule 1   pramipexole (MIRAPEX) 0.25 MG tablet TAKE 1 TABLET BY MOUTH IN THE  EVENING AS NEEDED FOR LEG PAIN.  MAY TAKE 2ND TABLET IF NEEDED 180 tablet 3   rizatriptan (MAXALT-MLT) 5 MG disintegrating tablet Take 1 tablet (5 mg total) by mouth as needed. May repeat in 2 hours if needed 15 tablet 5   rosuvastatin (CRESTOR) 20 MG tablet Take 1 tablet (20 mg total) by mouth daily. 90 tablet 3   tirzepatide (MOUNJARO) 10 MG/0.5ML Pen Inject 10 mg into the skin once a week. 6 mL 0   vitamin B-12 (CYANOCOBALAMIN) 1000 MCG tablet Take 1,000 mcg by mouth daily.     vitamin E 180 MG (400 UNITS) capsule Take 400 Units by mouth at bedtime.     No current facility-administered medications on file prior to visit.    Review of Systems:  As per HPI- otherwise negative.   Physical Examination: There were no vitals filed for this visit. There were no vitals filed for this visit. There is no height or weight on file to calculate BMI. Ideal Body Weight:    Patient observed via video monitor, she looks well Assessment and Plan: Chronic insomnia  Patient seen today with concern of chronic insomnia She is taking gabapentin as well as nortriptyline, and has been on alprazolam for a long time.  She notes she is getting to sleep okay, but may wake up an hour or 2 later to have difficulty getting back  to sleep.  We decided to try her on extended release alprazolam, she will let me know how this works for her  Signed Abbe Amsterdam, MD

## 2023-03-03 ENCOUNTER — Telehealth: Payer: Medicare Other | Admitting: Family Medicine

## 2023-03-03 DIAGNOSIS — F5104 Psychophysiologic insomnia: Secondary | ICD-10-CM

## 2023-03-03 MED ORDER — ALPRAZOLAM ER 1 MG PO TB24
1.0000 mg | ORAL_TABLET | Freq: Every day | ORAL | 0 refills | Status: DC
Start: 2023-03-03 — End: 2023-03-11

## 2023-03-04 ENCOUNTER — Telehealth: Payer: Self-pay

## 2023-03-04 NOTE — Telephone Encounter (Signed)
Received a PA request from the pharmacy for this Rx.

## 2023-03-07 ENCOUNTER — Telehealth: Payer: Self-pay

## 2023-03-07 ENCOUNTER — Other Ambulatory Visit (HOSPITAL_COMMUNITY): Payer: Self-pay

## 2023-03-07 NOTE — Telephone Encounter (Signed)
PA request has been Submitted. New Encounter created for follow up. For additional info see Pharmacy Prior Auth telephone encounter from 03/07/23.

## 2023-03-07 NOTE — Telephone Encounter (Signed)
Pharmacy Patient Advocate Encounter  Received notification from [facility] that Prior Authorization for ALPRAZolam ER 1MG  er tablets has been DENIED.  Full denial letter will be uploaded to the media tab. See denial reason below.   PA #/Case ID/Reference #:  [id number]   DENIAL REASON: ALPRAZOLAM TAB 1MG  ER is not FDA approved for your medical condition(s): Chronic insomnia. These condition(s) are not supported by one of the accepted references. Therefore your drug is denied because it is not being used for a "medically accepted indication."

## 2023-03-07 NOTE — Telephone Encounter (Signed)
Pharmacy Patient Advocate Encounter   Received notification from Pt Calls Messages that prior authorization for ALPRAZolam ER 1MG  er tablets is required/requested.   Insurance verification completed.   The patient is insured through [facility] .   Per test claim: PA required; PA submitted to [facility] via CoverMyMeds Key/confirmation #/EOC [id number] Status is pending

## 2023-03-10 ENCOUNTER — Encounter: Payer: Self-pay | Admitting: Family Medicine

## 2023-03-10 DIAGNOSIS — F5104 Psychophysiologic insomnia: Secondary | ICD-10-CM

## 2023-03-11 MED ORDER — CLONAZEPAM 0.5 MG PO TABS
0.5000 mg | ORAL_TABLET | Freq: Every day | ORAL | 0 refills | Status: DC
Start: 2023-03-11 — End: 2023-11-03

## 2023-03-11 NOTE — Addendum Note (Signed)
Addended by: Abbe Amsterdam C on: 03/11/2023 06:45 PM   Modules accepted: Orders

## 2023-03-20 ENCOUNTER — Telehealth: Payer: Self-pay | Admitting: Family Medicine

## 2023-03-20 NOTE — Telephone Encounter (Signed)
Copied from CRM [id number]. Topic: Medicare AWV >> Mar 20, 2023  2:15 PM Payton Doughty wrote: Reason for CRM: Called LVM 03/20/2023 to schedule Annual Wellness Visit TELEHEALTH ONLY  Verlee Rossetti; Care Guide Ambulatory Clinical Support [place] [facility] Direct Dial: [phone]

## 2023-04-02 ENCOUNTER — Other Ambulatory Visit: Payer: Self-pay | Admitting: Family Medicine

## 2023-04-02 DIAGNOSIS — E785 Hyperlipidemia, unspecified: Secondary | ICD-10-CM

## 2023-04-02 DIAGNOSIS — E1165 Type 2 diabetes mellitus with hyperglycemia: Secondary | ICD-10-CM

## 2023-04-15 ENCOUNTER — Encounter: Payer: Self-pay | Admitting: Family Medicine

## 2023-04-15 ENCOUNTER — Other Ambulatory Visit: Payer: Self-pay | Admitting: Family Medicine

## 2023-04-15 DIAGNOSIS — F5104 Psychophysiologic insomnia: Secondary | ICD-10-CM

## 2023-04-15 DIAGNOSIS — G43909 Migraine, unspecified, not intractable, without status migrainosus: Secondary | ICD-10-CM

## 2023-04-16 ENCOUNTER — Encounter: Payer: Self-pay | Admitting: Family Medicine

## 2023-04-16 MED ORDER — RIZATRIPTAN BENZOATE 5 MG PO TBDP: 5.0000 mg | ORAL_TABLET | ORAL | 5 refills | Status: DC | PRN

## 2023-04-18 ENCOUNTER — Encounter: Payer: Self-pay | Admitting: Family Medicine

## 2023-04-29 ENCOUNTER — Other Ambulatory Visit: Payer: Self-pay | Admitting: Family Medicine

## 2023-04-29 DIAGNOSIS — G2581 Restless legs syndrome: Secondary | ICD-10-CM

## 2023-05-20 DIAGNOSIS — A419 Sepsis, unspecified organism: Secondary | ICD-10-CM | POA: Diagnosis not present

## 2023-05-20 DIAGNOSIS — D62 Acute posthemorrhagic anemia: Secondary | ICD-10-CM | POA: Diagnosis not present

## 2023-05-20 DIAGNOSIS — I10 Essential (primary) hypertension: Secondary | ICD-10-CM | POA: Diagnosis not present

## 2023-05-20 DIAGNOSIS — R1110 Vomiting, unspecified: Secondary | ICD-10-CM | POA: Diagnosis not present

## 2023-05-20 DIAGNOSIS — Z7984 Long term (current) use of oral hypoglycemic drugs: Secondary | ICD-10-CM | POA: Diagnosis not present

## 2023-05-20 DIAGNOSIS — E119 Type 2 diabetes mellitus without complications: Secondary | ICD-10-CM | POA: Diagnosis not present

## 2023-05-20 DIAGNOSIS — N179 Acute kidney failure, unspecified: Secondary | ICD-10-CM | POA: Diagnosis not present

## 2023-05-20 DIAGNOSIS — E785 Hyperlipidemia, unspecified: Secondary | ICD-10-CM | POA: Diagnosis not present

## 2023-05-20 DIAGNOSIS — K529 Noninfective gastroenteritis and colitis, unspecified: Secondary | ICD-10-CM | POA: Diagnosis not present

## 2023-05-20 DIAGNOSIS — D649 Anemia, unspecified: Secondary | ICD-10-CM | POA: Diagnosis not present

## 2023-05-20 DIAGNOSIS — R109 Unspecified abdominal pain: Secondary | ICD-10-CM | POA: Diagnosis not present

## 2023-05-20 LAB — BASIC METABOLIC PANEL
BUN: 35 — AB (ref 4–21)
CO2: 21 (ref 13–22)
Chloride: 99 (ref 99–108)
Creatinine: 3.4 — AB (ref 0.5–1.1)
Glucose: 138
Potassium: 4.7 meq/L (ref 3.5–5.1)
Sodium: 131 — AB (ref 137–147)

## 2023-05-21 LAB — HEMOGLOBIN A1C: Hemoglobin A1C: 7

## 2023-05-23 ENCOUNTER — Other Ambulatory Visit: Payer: Self-pay | Admitting: Family Medicine

## 2023-05-24 LAB — CBC AND DIFFERENTIAL
HCT: 31 — AB (ref 36–46)
Hemoglobin: 10.3 — AB (ref 12.0–16.0)
Platelets: 197 10*3/uL (ref 150–400)
WBC: 6.2

## 2023-05-29 ENCOUNTER — Other Ambulatory Visit: Payer: Self-pay | Admitting: Family Medicine

## 2023-05-29 DIAGNOSIS — F418 Other specified anxiety disorders: Secondary | ICD-10-CM

## 2023-05-29 MED ORDER — ALPRAZOLAM 1 MG PO TABS: 1.0000 mg | ORAL_TABLET | Freq: Every evening | ORAL | 0 refills | Status: DC | PRN

## 2023-06-01 NOTE — Progress Notes (Unsigned)
[facility] at [facility] [address] [place], [place] [address] [phone]  Date:  06/02/2023   Name:  Jodi Williams   DOB:  08-Oct-1950   MRN:  [id number]  PCP:  Pearline Cables, MD    Chief Complaint: No chief complaint on file.   History of Present Illness:  Jodi Williams is a 73 y.o. very pleasant female patient who presents with the following:  Patient seen today for follow-up-she was recently treated at a hospital in [facility] for colitis I saw her most recently for virtual visit in October due to insomnia- history of hypertension, diabetes, depression, asthma, hyperlipidemia, arthritis status post knee replacement, melanoma   Flu vaccine Recommend Shingrix Recommend COVID booster Cologuard is due in February Can follow-up A1c today Lab Results  Component Value Date   HGBA1C 8.0 (H) 12/04/2022   Glipizide XL 5 Metformin thousand twice daily Patient Active Problem List   Diagnosis Date Noted   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   Status post revision of total replacement of left knee 09/21/2020   History of partial knee replacement 11/03/2017   Chronic pain of left knee 11/03/2017   Chronic migraine 03/31/2017   Preop cardiovascular exam 07/12/2015   Dyspnea 07/12/2015   Chest pain 07/12/2015   Restless legs 05/24/2015   Diabetes (HCC) 08/24/2013   Other and unspecified hyperlipidemia 09/07/2012   HTN (hypertension) 04/12/2012   OA (osteoarthritis) of knee 04/12/2012   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   Depression with anxiety 04/12/2012   H/O gastric ulcer 04/12/2012   ALLERGIC RHINITIS WITH CONJUNCTIVITIS 09/08/2007   ASTHMA 07/20/2007    Past Medical History:  Diagnosis Date   Allergy    Anxiety    Arthritis    Asthma    Cataract    COPD (chronic obstructive pulmonary disease) (HCC)    Depression    Diabetes mellitus (HCC)    Hyperlipidemia    Hypertension     PONV (postoperative nausea and vomiting)    Ulcer     Past Surgical History:  Procedure Laterality Date   BACK SURGERY     BREAST SURGERY     COLONOSCOPY WITH ESOPHAGOGASTRODUODENOSCOPY (EGD)     EYE SURGERY     cataract   HAND SURGERY     KNEE SURGERY     TOTAL KNEE REVISION Left 09/21/2020   Procedure: LEFT KNEE REVISION ARTHROPLASTY;  Surgeon: Kathryne Hitch, MD;  Location: [facility] OR;  Service: Orthopedics;  Laterality: Left;    Social History   Tobacco Use   Smoking status: Never   Smokeless tobacco: Never  Vaping Use   Vaping status: Never Used  Substance Use Topics   Alcohol use: Not Currently    Alcohol/week: 0.0 standard drinks of alcohol   Drug use: No    Family History  Problem Relation Age of Onset   Hyperlipidemia Mother    Hypertension Mother    Heart disease Mother    Dementia Mother    Arthritis Mother    Neuropathy Father    Diabetes Father    Kidney disease Father     Allergies  Allergen Reactions   Omnicef [Cefdinir] Rash    Medication list has been reviewed and updated.  Current Outpatient Medications on File Prior to Visit  Medication Sig Dispense Refill   ACCU-CHEK GUIDE test strip CHECK BLOOD SUGAR UP TO TWICE  DAILY AS DIRECTED 200 strip  3   acetaminophen (TYLENOL) 650 MG CR tablet Take 650 mg by mouth every 8 (eight) hours as needed for pain.     albuterol (PROVENTIL HFA;VENTOLIN HFA) 108 (90 BASE) MCG/ACT inhaler Inhale 2 puffs into the lungs every 4 (four) hours as needed for wheezing or shortness of breath (cough, shortness of breath or wheezing.). (Patient taking differently: Inhale 2 puffs into the lungs every 4 (four) hours as needed for wheezing or shortness of breath.) 1 Inhaler 1   ALPRAZolam (XANAX) 1 MG tablet Take 1 tablet (1 mg total) by mouth at bedtime as needed. for sleep 90 tablet 0   aspirin 81 MG chewable tablet Chew 1 tablet (81 mg total) by mouth 2 (two) times daily. 60 tablet 0   Calcium Carb-Cholecalciferol  (CALCIUM 600 + D PO) Take 2 tablets by mouth at bedtime.     cetirizine (ZYRTEC) 10 MG tablet Take 10 mg by mouth daily.     cholecalciferol (VITAMIN D3) 25 MCG (1000 UNIT) tablet Take 1,000 Units by mouth at bedtime.     CINNAMON PO Take 2 capsules by mouth at bedtime.     citalopram (CELEXA) 20 MG tablet Take 1 tablet (20 mg total) by mouth daily. 90 tablet 1   clonazePAM (KLONOPIN) 0.5 MG tablet Take 1 tablet (0.5 mg total) by mouth at bedtime. 15 tablet 0   Coenzyme Q10 (CO Q-10) 100 MG CAPS Take 100 mg by mouth at bedtime.     gabapentin (NEURONTIN) 300 MG capsule TAKE 1 CAPSULE BY MOUTH AT  BEDTIME FOR RESTLESS LEGS. MAY  TITRATE UP TO 3 TIMES DAILY  GRADUALLY IF NEEDED 270 capsule 0   glipiZIDE (GLUCOTROL XL) 5 MG 24 hr tablet Take 1 tablet (5 mg total) by mouth daily with breakfast. 90 tablet 3   hydroxypropyl methylcellulose / hypromellose (ISOPTO TEARS / GONIOVISC) 2.5 % ophthalmic solution Place 1 drop into both eyes 3 (three) times daily as needed for dry eyes.     Insulin Pen Needle (PEN NEEDLES) 32G X 4 MM MISC Use one daily for insulin injection 100 each 2   lisinopril-hydrochlorothiazide (ZESTORETIC) 10-12.5 MG tablet TAKE 1 TABLET BY MOUTH  DAILY 90 tablet 3   meloxicam (MOBIC) 15 MG tablet TAKE 1 TABLET BY MOUTH DAILY 90 tablet 3   metFORMIN (GLUCOPHAGE) 1000 MG tablet Take 1 tablet (1,000 mg total) by mouth 2 (two) times daily with a meal. 180 tablet 1   methocarbamol (ROBAXIN) 500 MG tablet Take 1 tablet (500 mg total) by mouth every 8 (eight) hours as needed for muscle spasms. 40 tablet 1   milk thistle 175 MG tablet Take 350 mg by mouth at bedtime.     MOUNJARO 10 MG/0.5ML Pen ADMINISTER 10 MG UNDER THE SKIN 1 TIME A WEEK 6 mL 0   Multiple Vitamins-Minerals (WOMENS MULTIVITAMIN PO) Take 1 tablet by mouth at bedtime.     nortriptyline (PAMELOR) 10 MG capsule TAKE 2 CAPSULES BY MOUTH AT  BEDTIME 180 capsule 3   omeprazole (PRILOSEC) 40 MG capsule Take 1 capsule (40 mg total) by  mouth daily. 90 capsule 1   pramipexole (MIRAPEX) 0.25 MG tablet TAKE 1 TABLET BY MOUTH IN THE  EVENING AS NEEDED FOR LEG PAIN.  MAY TAKE 2ND TABLET IF NEEDED 180 tablet 3   rizatriptan (MAXALT-MLT) 5 MG disintegrating tablet Take 1 tablet (5 mg total) by mouth as needed. May repeat in 2 hours if needed 15 tablet 5   rosuvastatin (CRESTOR) 20 MG  tablet Take 1 tablet (20 mg total) by mouth daily. 90 tablet 3   vitamin B-12 (CYANOCOBALAMIN) 1000 MCG tablet Take 1,000 mcg by mouth daily.     vitamin E 180 MG (400 UNITS) capsule Take 400 Units by mouth at bedtime.     No current facility-administered medications on file prior to visit.    Review of Systems:  As per HPI- otherwise negative.   Physical Examination: There were no vitals filed for this visit. There were no vitals filed for this visit. There is no height or weight on file to calculate BMI. Ideal Body Weight:    GEN: no acute distress. HEENT: Atraumatic, Normocephalic.  Ears and Nose: No external deformity. CV: RRR, No M/G/R. No JVD. No thrill. No extra heart sounds. PULM: CTA B, no wheezes, crackles, rhonchi. No retractions. No resp. distress. No accessory muscle use. ABD: S, NT, ND, +BS. No rebound. No HSM. EXTR: No c/c/e PSYCH: Normally interactive. Conversant.    Assessment and Plan: ***  Signed Abbe Amsterdam, MD

## 2023-06-02 ENCOUNTER — Encounter: Payer: Self-pay | Admitting: Family Medicine

## 2023-06-02 ENCOUNTER — Ambulatory Visit (INDEPENDENT_AMBULATORY_CARE_PROVIDER_SITE_OTHER): Payer: Medicare Other | Admitting: Family Medicine

## 2023-06-02 VITALS — BP 134/82 | HR 87 | Ht 62.0 in | Wt 157.2 lb

## 2023-06-02 DIAGNOSIS — Z7985 Long-term (current) use of injectable non-insulin antidiabetic drugs: Secondary | ICD-10-CM

## 2023-06-02 DIAGNOSIS — Z7984 Long term (current) use of oral hypoglycemic drugs: Secondary | ICD-10-CM | POA: Diagnosis not present

## 2023-06-02 DIAGNOSIS — E1165 Type 2 diabetes mellitus with hyperglycemia: Secondary | ICD-10-CM

## 2023-06-02 DIAGNOSIS — R197 Diarrhea, unspecified: Secondary | ICD-10-CM

## 2023-06-02 DIAGNOSIS — K529 Noninfective gastroenteritis and colitis, unspecified: Secondary | ICD-10-CM | POA: Diagnosis not present

## 2023-06-02 DIAGNOSIS — E785 Hyperlipidemia, unspecified: Secondary | ICD-10-CM | POA: Diagnosis not present

## 2023-06-02 DIAGNOSIS — F5104 Psychophysiologic insomnia: Secondary | ICD-10-CM

## 2023-06-02 NOTE — Patient Instructions (Addendum)
It was good to see you today- I will be in touch with your labs asap  I am glad you are feeling better - let me know if symptoms return

## 2023-06-03 ENCOUNTER — Encounter: Payer: Self-pay | Admitting: Family Medicine

## 2023-06-03 LAB — CBC
HCT: 36.8 % (ref 36.0–46.0)
Hemoglobin: 12.3 g/dL (ref 12.0–15.0)
MCHC: 33.6 g/dL (ref 30.0–36.0)
MCV: 84.8 fL (ref 78.0–100.0)
Platelets: 241 10*3/uL (ref 150.0–400.0)
RBC: 4.34 Mil/uL (ref 3.87–5.11)
RDW: 14.3 % (ref 11.5–15.5)
WBC: 10.8 10*3/uL — ABNORMAL HIGH (ref 4.0–10.5)

## 2023-06-03 LAB — COMPREHENSIVE METABOLIC PANEL
ALT: 16 U/L (ref 0–35)
AST: 16 U/L (ref 0–37)
Albumin: 4.4 g/dL (ref 3.5–5.2)
Alkaline Phosphatase: 65 U/L (ref 39–117)
BUN: 13 mg/dL (ref 6–23)
CO2: 28 meq/L (ref 19–32)
Calcium: 9.9 mg/dL (ref 8.4–10.5)
Chloride: 98 meq/L (ref 96–112)
Creatinine, Ser: 0.99 mg/dL (ref 0.40–1.20)
GFR: 57.59 mL/min — ABNORMAL LOW (ref >60.00)
Glucose, Bld: 110 mg/dL — ABNORMAL HIGH (ref 70–99)
Potassium: 4.4 meq/L (ref 3.5–5.1)
Sodium: 134 meq/L — ABNORMAL LOW (ref 135–145)
Total Bilirubin: 0.4 mg/dL (ref 0.2–1.2)
Total Protein: 7 g/dL (ref 6.0–8.3)

## 2023-06-07 ENCOUNTER — Encounter: Payer: Self-pay | Admitting: Family Medicine

## 2023-06-07 DIAGNOSIS — E785 Hyperlipidemia, unspecified: Secondary | ICD-10-CM

## 2023-06-07 DIAGNOSIS — E1165 Type 2 diabetes mellitus with hyperglycemia: Secondary | ICD-10-CM

## 2023-06-22 ENCOUNTER — Other Ambulatory Visit: Payer: Self-pay | Admitting: Family Medicine

## 2023-06-22 DIAGNOSIS — F418 Other specified anxiety disorders: Secondary | ICD-10-CM

## 2023-06-22 DIAGNOSIS — E119 Type 2 diabetes mellitus without complications: Secondary | ICD-10-CM

## 2023-06-25 MED ORDER — MOUNJARO 10 MG/0.5ML ~~LOC~~ SOAJ: 10.0000 mg | SUBCUTANEOUS | 0 refills | Status: DC

## 2023-07-01 ENCOUNTER — Other Ambulatory Visit: Payer: Self-pay | Admitting: Family Medicine

## 2023-07-01 DIAGNOSIS — G2581 Restless legs syndrome: Secondary | ICD-10-CM

## 2023-07-10 DIAGNOSIS — D2271 Melanocytic nevi of right lower limb, including hip: Secondary | ICD-10-CM | POA: Diagnosis not present

## 2023-07-10 DIAGNOSIS — L814 Other melanin hyperpigmentation: Secondary | ICD-10-CM | POA: Diagnosis not present

## 2023-07-10 DIAGNOSIS — D485 Neoplasm of uncertain behavior of skin: Secondary | ICD-10-CM | POA: Diagnosis not present

## 2023-07-10 DIAGNOSIS — L821 Other seborrheic keratosis: Secondary | ICD-10-CM | POA: Diagnosis not present

## 2023-07-10 DIAGNOSIS — L02223 Furuncle of chest wall: Secondary | ICD-10-CM | POA: Diagnosis not present

## 2023-07-25 ENCOUNTER — Other Ambulatory Visit: Payer: Self-pay | Admitting: Family Medicine

## 2023-07-25 DIAGNOSIS — R1013 Epigastric pain: Secondary | ICD-10-CM

## 2023-07-25 DIAGNOSIS — E119 Type 2 diabetes mellitus without complications: Secondary | ICD-10-CM

## 2023-08-11 DIAGNOSIS — D2271 Melanocytic nevi of right lower limb, including hip: Secondary | ICD-10-CM | POA: Diagnosis not present

## 2023-08-11 DIAGNOSIS — D485 Neoplasm of uncertain behavior of skin: Secondary | ICD-10-CM | POA: Diagnosis not present

## 2023-08-28 ENCOUNTER — Other Ambulatory Visit: Payer: Self-pay | Admitting: Family Medicine

## 2023-08-28 DIAGNOSIS — F418 Other specified anxiety disorders: Secondary | ICD-10-CM

## 2023-08-30 ENCOUNTER — Other Ambulatory Visit: Payer: Self-pay | Admitting: Family Medicine

## 2023-08-30 DIAGNOSIS — F418 Other specified anxiety disorders: Secondary | ICD-10-CM

## 2023-09-01 MED ORDER — ALPRAZOLAM 1 MG PO TABS: 1.0000 mg | ORAL_TABLET | Freq: Every evening | ORAL | 0 refills | Status: DC | PRN

## 2023-09-04 DIAGNOSIS — Z1231 Encounter for screening mammogram for malignant neoplasm of breast: Secondary | ICD-10-CM | POA: Diagnosis not present

## 2023-09-04 DIAGNOSIS — L900 Lichen sclerosus et atrophicus: Secondary | ICD-10-CM | POA: Diagnosis not present

## 2023-09-04 LAB — HM MAMMOGRAPHY

## 2023-09-18 ENCOUNTER — Encounter: Payer: Self-pay | Admitting: Family Medicine

## 2023-09-18 ENCOUNTER — Other Ambulatory Visit: Payer: Self-pay | Admitting: Family Medicine

## 2023-09-18 DIAGNOSIS — E1165 Type 2 diabetes mellitus with hyperglycemia: Secondary | ICD-10-CM

## 2023-09-18 DIAGNOSIS — E785 Hyperlipidemia, unspecified: Secondary | ICD-10-CM

## 2023-09-18 MED ORDER — MOUNJARO 10 MG/0.5ML ~~LOC~~ SOAJ: 10.0000 mg | SUBCUTANEOUS | 0 refills | Status: DC

## 2023-10-15 ENCOUNTER — Other Ambulatory Visit: Payer: Self-pay | Admitting: Family Medicine

## 2023-10-15 DIAGNOSIS — E785 Hyperlipidemia, unspecified: Secondary | ICD-10-CM

## 2023-10-15 DIAGNOSIS — E1165 Type 2 diabetes mellitus with hyperglycemia: Secondary | ICD-10-CM

## 2023-10-16 ENCOUNTER — Telehealth: Payer: Self-pay

## 2023-10-16 NOTE — Telephone Encounter (Signed)
Rx already sent earlier today

## 2023-10-16 NOTE — Telephone Encounter (Signed)
 Copied from CRM [id number]. Topic: Clinical - Prescription Issue >> Oct 16, 2023  8:16 AM Kita Perish H wrote: Reason for CRM: Patient is calling in a refill for her tirzepatide  (MOUNJARO ) 10 MG/0.5ML Pen, per notes patient needs an appointment for refill. Patient is out of town until 6/15 but agent scheduled an appointment for 6/23, patient is asking is she can have a refill called in to hold her over until she comes in for appointment,  Idamae Maize [phone]

## 2023-10-27 NOTE — Patient Instructions (Addendum)
 Good to see you again- I will be in touch with your labs asap and we can decide about your Mounajro dose Please take care of that Cologaurd kit!    Assuming all is well please see me in about 6 months

## 2023-10-27 NOTE — Progress Notes (Signed)
 [facility] at [facility] [address] [place], [place] [address] [phone]  Date:  11/03/2023   Name:  Jodi Williams   DOB:  08-18-1950   MRN:  [id number]  PCP:  Watt Harlene BROCKS, MD    Chief Complaint: Medical Management of Chronic Issues   History of Present Illness:  Jodi Williams is a 73 y.o. very pleasant female patient who presents with the following:  Pt seen today for medication refill- history of hypertension, diabetes, depression, asthma, hyperlipidemia, arthritis status post knee replacement, melanoma    Last seen by myself in January   Lab Results  Component Value Date   HGBA1C 7.0 05/21/2023   Foot exam is due Urine micro due Cologuard due to update- she has this at home and will take care of it  Shingrix  Her home glucose was looking ok until the last 3 months - she notes the effect of Mounjaro  seems to be lessening as she gets used to it and she is eating more sweets again   Wt Readings from Last 3 Encounters:  11/03/23 152 lb (68.9 kg)  06/02/23 157 lb 3.2 oz (71.3 kg)  12/04/22 153 lb 6.4 oz (69.6 kg)   She is on mounjaro  10 fight now- we may want to go up to 10 mg She has noted a hoarse voice for several months- 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 and had some treatment  No pain with swallowing On occasion she will get meat stuck in her throat but not often  She was never a smoker and does not drink alcohol    She hurt her right great toe on a recent vacation- she is not quite sure what happened but she got a large blister She did a lot of walking wearing a new pair of shoes which which may be the culprit  Patient Active Problem List   Diagnosis Date Noted   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   Status post revision of total replacement of left knee 09/21/2020   History of partial knee replacement 11/03/2017   Chronic pain  of left knee 11/03/2017   Chronic migraine 03/31/2017   Preop cardiovascular exam 07/12/2015   Dyspnea 07/12/2015   Chest pain 07/12/2015   Restless legs 05/24/2015   Diabetes (HCC) 08/24/2013   Other and unspecified hyperlipidemia 09/07/2012   HTN (hypertension) 04/12/2012   OA (osteoarthritis) of knee 04/12/2012   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   Depression with anxiety 04/12/2012   H/O gastric ulcer 04/12/2012   ALLERGIC RHINITIS WITH CONJUNCTIVITIS 09/08/2007   ASTHMA 07/20/2007    Past Medical History:  Diagnosis Date   Allergy    Anxiety    Arthritis    Asthma    Cataract    COPD (chronic obstructive pulmonary disease) (HCC)    Depression    Diabetes mellitus (HCC)    Hyperlipidemia    Hypertension    PONV (postoperative nausea and vomiting)    Ulcer     Past Surgical History:  Procedure Laterality Date   BACK SURGERY     BREAST SURGERY     COLONOSCOPY WITH ESOPHAGOGASTRODUODENOSCOPY (EGD)     EYE SURGERY     cataract   HAND SURGERY     KNEE SURGERY     TOTAL KNEE REVISION Left 09/21/2020   Procedure: LEFT KNEE REVISION ARTHROPLASTY;  Surgeon: Vernetta Bruckner  Y, MD;  Location: [facility] OR;  Service: Orthopedics;  Laterality: Left;    Social History   Tobacco Use   Smoking status: Never   Smokeless tobacco: Never  Vaping Use   Vaping status: Never Used  Substance Use Topics   Alcohol use: Not Currently    Alcohol/week: 0.0 standard drinks of alcohol   Drug use: No    Family History  Problem Relation Age of Onset   Hyperlipidemia Mother    Hypertension Mother    Heart disease Mother    Dementia Mother    Arthritis Mother    Neuropathy Father    Diabetes Father    Kidney disease Father     Allergies  Allergen Reactions   Omnicef [Cefdinir] Rash    Medication list has been reviewed and updated.  Current Outpatient Medications on File Prior to Visit  Medication Sig Dispense Refill   ACCU-CHEK GUIDE TEST test strip USE TO CHECK BLOOD  SUGAR UP TO  TWICE DAILY AS DIRECTED 200 strip 3   acetaminophen  (TYLENOL ) 650 MG CR tablet Take 650 mg by mouth every 8 (eight) hours as needed for pain.     ALPRAZolam  (XANAX ) 1 MG tablet Take 1 tablet (1 mg total) by mouth at bedtime as needed. for sleep 90 tablet 0   aspirin  81 MG chewable tablet Chew 1 tablet (81 mg total) by mouth 2 (two) times daily. 60 tablet 0   Calcium  Carb-Cholecalciferol  (CALCIUM  600 + D PO) Take 2 tablets by mouth at bedtime.     cetirizine (ZYRTEC) 10 MG tablet Take 10 mg by mouth daily.     cholecalciferol  (VITAMIN D3) 25 MCG (1000 UNIT) tablet Take 1,000 Units by mouth at bedtime.     CINNAMON PO Take 2 capsules by mouth at bedtime.     citalopram  (CELEXA ) 20 MG tablet TAKE 1 TABLET BY MOUTH DAILY 90 tablet 3   Coenzyme Q10 (CO Q-10) 100 MG CAPS Take 100 mg by mouth at bedtime.     gabapentin  (NEURONTIN ) 300 MG capsule TAKE 1 CAPSULE BY MOUTH AT  BEDTIME FOR RESTLESS LEGS. MAY  TITRATE UP TO 3 TIMES DAILY  GRADUALLY IF NEEDED 270 capsule 3   glipiZIDE  (GLUCOTROL  XL) 5 MG 24 hr tablet Take 1 tablet (5 mg total) by mouth daily with breakfast. 90 tablet 3   lisinopril -hydrochlorothiazide  (ZESTORETIC ) 10-12.5 MG tablet TAKE 1 TABLET BY MOUTH  DAILY 90 tablet 3   meloxicam  (MOBIC ) 15 MG tablet TAKE 1 TABLET BY MOUTH DAILY 90 tablet 3   metFORMIN  (GLUCOPHAGE ) 1000 MG tablet TAKE 1 TABLET BY MOUTH TWICE  DAILY WITH MEALS 180 tablet 3   methocarbamol  (ROBAXIN ) 500 MG tablet Take 1 tablet (500 mg total) by mouth every 8 (eight) hours as needed for muscle spasms. 40 tablet 1   milk thistle 175 MG tablet Take 350 mg by mouth at bedtime.     Multiple Vitamins-Minerals (WOMENS MULTIVITAMIN PO) Take 1 tablet by mouth at bedtime.     nortriptyline  (PAMELOR ) 10 MG capsule TAKE 2 CAPSULES BY MOUTH AT  BEDTIME 180 capsule 3   omeprazole  (PRILOSEC) 40 MG capsule TAKE 1 CAPSULE BY MOUTH DAILY 90 capsule 3   pramipexole  (MIRAPEX ) 0.25 MG tablet TAKE 1 TABLET BY MOUTH IN THE  EVENING  AS NEEDED FOR LEG PAIN.  MAY TAKE 2ND TABLET IF NEEDED 180 tablet 3   rizatriptan  (MAXALT -MLT) 5 MG disintegrating tablet Take 1 tablet (5 mg total) by mouth as needed. May repeat in  2 hours if needed 15 tablet 5   rosuvastatin  (CRESTOR ) 20 MG tablet Take 1 tablet (20 mg total) by mouth daily. 90 tablet 3   tirzepatide  (MOUNJARO ) 10 MG/0.5ML Pen Inject 10 mg into the skin once a week. 2 mL 0   vitamin B-12 (CYANOCOBALAMIN ) 1000 MCG tablet Take 1,000 mcg by mouth daily.     vitamin E  180 MG (400 UNITS) capsule Take 400 Units by mouth at bedtime.     No current facility-administered medications on file prior to visit.    Review of Systems:  As per HPI- otherwise negative.   Physical Examination: Vitals:   11/03/23 1304  BP: 134/78  Pulse: 84  SpO2: 97%   Vitals:   11/03/23 1304  Weight: 152 lb (68.9 kg)  Height: 5' 2 (1.575 m)   Body mass index is 27.8 kg/m. Ideal Body Weight: Weight in (lb) to have BMI = 25: 136.4  GEN: no acute distress.  Overweight, looks well HEENT: Atraumatic, Normocephalic.  Ears and Nose: No external deformity. CV: RRR, No M/G/R. No JVD. No thrill. No extra heart sounds. PULM: CTA B, no wheezes, crackles, rhonchi. No retractions. No resp. distress. No accessory muscle use. ABD: S, NT, ND EXTR: No c/c/e PSYCH: Normally interactive. Conversant.  Resolving blister on right great toe   Assessment and Plan: Uncontrolled type 2 diabetes mellitus with hyperglycemia (HCC) - Plan: Basic metabolic panel with GFR, Hemoglobin A1c, Microalbumin / creatinine urine ratio  Dyslipidemia - Plan: Lipid panel  Screening for thyroid  disorder - Plan: TSH  Screening for deficiency anemia - Plan: CBC  Chronic left-sided thoracic back pain - Plan: methocarbamol  (ROBAXIN ) 500 MG tablet  RLS (restless legs syndrome) - Plan: pramipexole  (MIRAPEX ) 0.25 MG tablet  Hoarse voice quality - Plan: Ambulatory referral to ENT  Type 2 diabetes mellitus with diabetic  neuropathy, without long-term current use of insulin (HCC) - Plan: Vitamin B12  Patient seen today for follow-up.  History of diabetes, currently taking Mounjaro .  Will check on her A1c and we might go up on her dosage Follow-up on lipids, thyroid , CBC today  She does have history of neuropathy, will check a B12 level Refilled her Mirapex  which she uses at night for restless legs and neuropathy symptoms.  She also occasionally uses methocarbamol   Slight hoarse voice- referral to ENT  Signed Harlene Schroeder, MD  Received labs, message to patient  Results for orders placed or performed in visit on 11/03/23  Basic metabolic panel with GFR   Collection Time: 11/03/23  1:23 PM  Result Value Ref Range   Sodium 134 (L) 135 - 145 mEq/L   Potassium 4.9 3.5 - 5.1 mEq/L   Chloride 97 96 - 112 mEq/L   CO2 27 19 - 32 mEq/L   Glucose, Bld 122 (H) 70 - 99 mg/dL   BUN 25 (H) 6 - 23 mg/dL   Creatinine, Ser 8.78 (H) 0.40 - 1.20 mg/dL   GFR 54.85 (L) >39.99 mL/min   Calcium  10.6 (H) 8.4 - 10.5 mg/dL  CBC   Collection Time: 11/03/23  1:23 PM  Result Value Ref Range   WBC 8.3 4.0 - 10.5 K/uL   RBC 4.62 3.87 - 5.11 Mil/uL   Platelets 218.0 150.0 - 400.0 K/uL   Hemoglobin 12.6 12.0 - 15.0 g/dL   HCT 61.8 63.9 - 53.9 %   MCV 82.3 78.0 - 100.0 fl   MCHC 33.2 30.0 - 36.0 g/dL   RDW 85.2 88.4 - 84.4 %  Hemoglobin  A1c   Collection Time: 11/03/23  1:23 PM  Result Value Ref Range   Hgb A1c MFr Bld 7.7 (H) 4.6 - 6.5 %  Lipid panel   Collection Time: 11/03/23  1:23 PM  Result Value Ref Range   Cholesterol 152 0 - 200 mg/dL   Triglycerides 773.9 (H) 0.0 - 149.0 mg/dL   HDL 57.49 >60.99 mg/dL   VLDL 54.7 (H) 0.0 - 59.9 mg/dL   LDL Cholesterol 64 0 - 99 mg/dL   Total CHOL/HDL Ratio 4    NonHDL 109.31   Microalbumin / creatinine urine ratio   Collection Time: 11/03/23  1:23 PM  Result Value Ref Range   Microalb, Ur 1.7 0.0 - 1.9 mg/dL   Creatinine,U 849.5 mg/dL   Microalb Creat Ratio 11.3 0.0 -  30.0 mg/g  TSH   Collection Time: 11/03/23  1:23 PM  Result Value Ref Range   TSH 0.72 0.35 - 5.50 uIU/mL  Vitamin B12   Collection Time: 11/03/23  1:23 PM  Result Value Ref Range   Vitamin B-12 706 211 - 911 pg/mL

## 2023-11-03 ENCOUNTER — Encounter: Payer: Self-pay | Admitting: Family Medicine

## 2023-11-03 ENCOUNTER — Ambulatory Visit (INDEPENDENT_AMBULATORY_CARE_PROVIDER_SITE_OTHER): Admitting: Family Medicine

## 2023-11-03 VITALS — BP 134/78 | HR 84 | Ht 62.0 in | Wt 152.0 lb

## 2023-11-03 DIAGNOSIS — M546 Pain in thoracic spine: Secondary | ICD-10-CM | POA: Diagnosis not present

## 2023-11-03 DIAGNOSIS — E1140 Type 2 diabetes mellitus with diabetic neuropathy, unspecified: Secondary | ICD-10-CM | POA: Diagnosis not present

## 2023-11-03 DIAGNOSIS — G2581 Restless legs syndrome: Secondary | ICD-10-CM

## 2023-11-03 DIAGNOSIS — E1165 Type 2 diabetes mellitus with hyperglycemia: Secondary | ICD-10-CM

## 2023-11-03 DIAGNOSIS — G8929 Other chronic pain: Secondary | ICD-10-CM | POA: Diagnosis not present

## 2023-11-03 DIAGNOSIS — Z130 Encounter for screening for diseases of the blood and blood-forming organs and certain disorders involving the immune mechanism: Secondary | ICD-10-CM | POA: Diagnosis not present

## 2023-11-03 DIAGNOSIS — F418 Other specified anxiety disorders: Secondary | ICD-10-CM

## 2023-11-03 DIAGNOSIS — Z7985 Long-term (current) use of injectable non-insulin antidiabetic drugs: Secondary | ICD-10-CM | POA: Diagnosis not present

## 2023-11-03 DIAGNOSIS — E785 Hyperlipidemia, unspecified: Secondary | ICD-10-CM

## 2023-11-03 DIAGNOSIS — R490 Dysphonia: Secondary | ICD-10-CM

## 2023-11-03 DIAGNOSIS — Z1329 Encounter for screening for other suspected endocrine disorder: Secondary | ICD-10-CM

## 2023-11-03 DIAGNOSIS — B977 Papillomavirus as the cause of diseases classified elsewhere: Secondary | ICD-10-CM | POA: Insufficient documentation

## 2023-11-03 LAB — BASIC METABOLIC PANEL WITH GFR
BUN: 25 mg/dL — ABNORMAL HIGH (ref 6–23)
CO2: 27 meq/L (ref 19–32)
Calcium: 10.6 mg/dL — ABNORMAL HIGH (ref 8.4–10.5)
Chloride: 97 meq/L (ref 96–112)
Creatinine, Ser: 1.21 mg/dL — ABNORMAL HIGH (ref 0.40–1.20)
GFR: 45.14 mL/min — ABNORMAL LOW (ref >60.00)
Glucose, Bld: 122 mg/dL — ABNORMAL HIGH (ref 70–99)
Potassium: 4.9 meq/L (ref 3.5–5.1)
Sodium: 134 meq/L — ABNORMAL LOW (ref 135–145)

## 2023-11-03 LAB — CBC
HCT: 38.1 % (ref 36.0–46.0)
Hemoglobin: 12.6 g/dL (ref 12.0–15.0)
MCHC: 33.2 g/dL (ref 30.0–36.0)
MCV: 82.3 fl (ref 78.0–100.0)
Platelets: 218 10*3/uL (ref 150.0–400.0)
RBC: 4.62 Mil/uL (ref 3.87–5.11)
RDW: 14.7 % (ref 11.5–15.5)
WBC: 8.3 10*3/uL (ref 4.0–10.5)

## 2023-11-03 LAB — MICROALBUMIN / CREATININE URINE RATIO
Creatinine,U: 150.4 mg/dL
Microalb Creat Ratio: 11.3 mg/g (ref 0.0–30.0)
Microalb, Ur: 1.7 mg/dL (ref 0.0–1.9)

## 2023-11-03 LAB — LIPID PANEL
Cholesterol: 152 mg/dL (ref 0–200)
HDL: 42.5 mg/dL (ref >39.00)
LDL Cholesterol: 64 mg/dL (ref 0–99)
NonHDL: 109.31
Total CHOL/HDL Ratio: 4
Triglycerides: 226 mg/dL — ABNORMAL HIGH (ref 0.0–149.0)
VLDL: 45.2 mg/dL — ABNORMAL HIGH (ref 0.0–40.0)

## 2023-11-03 LAB — VITAMIN B12: Vitamin B-12: 706 pg/mL (ref 211–911)

## 2023-11-03 LAB — HEMOGLOBIN A1C: Hgb A1c MFr Bld: 7.7 % — ABNORMAL HIGH (ref 4.6–6.5)

## 2023-11-03 LAB — TSH: TSH: 0.72 u[IU]/mL (ref 0.35–5.50)

## 2023-11-03 MED ORDER — METHOCARBAMOL 500 MG PO TABS: 500.0000 mg | ORAL_TABLET | Freq: Three times a day (TID) | ORAL | 1 refills | Status: DC | PRN

## 2023-11-03 MED ORDER — PRAMIPEXOLE DIHYDROCHLORIDE 0.25 MG PO TABS: ORAL_TABLET | ORAL | 3 refills | Status: DC

## 2023-11-04 MED ORDER — TIRZEPATIDE 12.5 MG/0.5ML ~~LOC~~ SOAJ: 12.5000 mg | SUBCUTANEOUS | 1 refills | Status: DC

## 2023-11-25 MED ORDER — ALPRAZOLAM 1 MG PO TABS: 1.0000 mg | ORAL_TABLET | Freq: Every evening | ORAL | 1 refills | Status: DC | PRN

## 2023-11-25 NOTE — Addendum Note (Signed)
 Addended by: WATT RAISIN C on: 11/25/2023 02:14 PM   Modules accepted: Orders

## 2023-11-26 ENCOUNTER — Other Ambulatory Visit: Payer: Self-pay | Admitting: Family Medicine

## 2023-12-16 NOTE — Progress Notes (Signed)
 [facility] Quality Team Note  Name: Jodi Williams Date of Birth: [birth year] MRN: [id number] Date: 12/16/2023  [facility] Quality Team has reviewed this patient's chart, please see recommendations below:  [facility] Quality Other; (CHART REVIEWED FOR COL. NO RECORD FOUND)

## 2024-01-08 DIAGNOSIS — L720 Epidermal cyst: Secondary | ICD-10-CM | POA: Diagnosis not present

## 2024-01-08 DIAGNOSIS — L821 Other seborrheic keratosis: Secondary | ICD-10-CM | POA: Diagnosis not present

## 2024-01-08 DIAGNOSIS — L905 Scar conditions and fibrosis of skin: Secondary | ICD-10-CM | POA: Diagnosis not present

## 2024-01-08 DIAGNOSIS — L814 Other melanin hyperpigmentation: Secondary | ICD-10-CM | POA: Diagnosis not present

## 2024-01-22 ENCOUNTER — Ambulatory Visit (INDEPENDENT_AMBULATORY_CARE_PROVIDER_SITE_OTHER): Admitting: Otolaryngology

## 2024-01-22 ENCOUNTER — Encounter (INDEPENDENT_AMBULATORY_CARE_PROVIDER_SITE_OTHER): Payer: Self-pay | Admitting: Otolaryngology

## 2024-01-22 VITALS — BP 103/63 | HR 80

## 2024-01-22 DIAGNOSIS — R0981 Nasal congestion: Secondary | ICD-10-CM | POA: Diagnosis not present

## 2024-01-22 DIAGNOSIS — R490 Dysphonia: Secondary | ICD-10-CM

## 2024-01-22 DIAGNOSIS — E1165 Type 2 diabetes mellitus with hyperglycemia: Secondary | ICD-10-CM

## 2024-01-22 DIAGNOSIS — R0982 Postnasal drip: Secondary | ICD-10-CM

## 2024-01-22 DIAGNOSIS — J383 Other diseases of vocal cords: Secondary | ICD-10-CM

## 2024-01-22 DIAGNOSIS — K219 Gastro-esophageal reflux disease without esophagitis: Secondary | ICD-10-CM | POA: Diagnosis not present

## 2024-01-22 MED ORDER — FLUTICASONE PROPIONATE 50 MCG/ACT NA SUSP: 2.0000 | Freq: Two times a day (BID) | NASAL | 6 refills | Status: DC

## 2024-01-22 MED ORDER — LEVOCETIRIZINE DIHYDROCHLORIDE 5 MG PO TABS: 5.0000 mg | ORAL_TABLET | Freq: Every evening | ORAL | 3 refills | Status: DC

## 2024-01-22 NOTE — Progress Notes (Signed)
 ENT CONSULT:  Reason for Consult: dysphonia   HPI: Discussed the use of AI scribe software for clinical note transcription with the patient, who gave verbal consent to proceed.  History of Present Illness Jodi Williams is a 73 year old female who presents with hoarseness.  She has been experiencing hoarseness for several months without associated pain or dysphagia. She reports dryness, which she attributes to her diabetes. No history of smoking or heartburn. She has a history of nodules that were removed in the past and is concerned about their potential return, although she has not noticed any nodules recently.  She is currently taking omeprazole  and Benadryl . The Benadryl  is used for allergies, which are triggered by dust and pollen.  Records Reviewed:  PCP office visit Dr Watt 11/03/23 She is on mounjaro  10 fight now- we may want to go up to 10 mg She has noted a hoarse voice for several months- 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 and had some treatment  No pain with swallowing On occasion she will get meat stuck in her throat but not often   She was never a smoker and does not drink alcohol    Past Medical History:  Diagnosis Date   Allergy    Anxiety    Arthritis    Asthma    Cataract    COPD (chronic obstructive pulmonary disease) (HCC)    Depression    Diabetes mellitus (HCC)    Hyperlipidemia    Hypertension    PONV (postoperative nausea and vomiting)    Ulcer     Past Surgical History:  Procedure Laterality Date   BACK SURGERY     BREAST SURGERY     COLONOSCOPY WITH ESOPHAGOGASTRODUODENOSCOPY (EGD)     EYE SURGERY     cataract   HAND SURGERY     KNEE SURGERY     TOTAL KNEE REVISION Left 09/21/2020   Procedure: LEFT KNEE REVISION ARTHROPLASTY;  Surgeon: Vernetta Lonni GRADE, MD;  Location: [facility] OR;  Service: Orthopedics;  Laterality: Left;    Family History  Problem Relation Age of Onset   Hyperlipidemia Mother    Hypertension Mother     Heart disease Mother    Dementia Mother    Arthritis Mother    Neuropathy Father    Diabetes Father    Kidney disease Father     Social History:  reports that she has never smoked. She has never used smokeless tobacco. She reports that she does not currently use alcohol. She reports that she does not use drugs.  Allergies:  Allergies  Allergen Reactions   Omnicef [Cefdinir] Rash    Medications: I have reviewed the patient's current medications.  The PMH, PSH, Medications, Allergies, and SH were reviewed and updated.  ROS: Constitutional: Negative for fever, weight loss and weight gain. Cardiovascular: Negative for chest pain and dyspnea on exertion. Respiratory: Is not experiencing shortness of breath at rest. Gastrointestinal: Negative for nausea and vomiting. Neurological: Negative for headaches. Psychiatric: The patient is not nervous/anxious  Blood pressure 103/63, pulse 80, SpO2 94%. There is no height or weight on file to calculate BMI.  PHYSICAL EXAM:  Exam: General: Well-developed, well-nourished Communication and Voice: raspy Respiratory Respiratory effort: Equal inspiration and expiration without stridor Cardiovascular Peripheral Vascular: Warm extremities with equal color/perfusion Eyes: No nystagmus with equal extraocular motion bilaterally Neuro/Psych/Balance: Patient oriented to person, place, and time; Appropriate mood and affect; Gait is intact with no imbalance; Cranial nerves I-XII are intact  Head and Face Inspection: Normocephalic and atraumatic without mass or lesion Palpation: Facial skeleton intact without bony stepoffs Salivary Glands: No mass or tenderness Facial Strength: Facial motility symmetric and full bilaterally ENT Pinna: External ear intact and fully developed External canal: Canal is patent with intact skin Tympanic Membrane: Clear and mobile External Nose: No scar or anatomic deformity Internal Nose: Septum is straight. No polyp,  or purulence. Mucosal edema and erythema present.  Bilateral inferior turbinate hypertrophy.  Lips, Teeth, and gums: Mucosa and teeth intact and viable TMJ: No pain to palpation with full mobility Oral cavity/oropharynx: No erythema or exudate, no lesions present Nasopharynx: No mass or lesion with intact mucosa Hypopharynx: Intact mucosa without pooling of secretions Larynx Glottic: Full true vocal cord mobility without lesion or mass Supraglottic: Normal appearing epiglottis and AE folds VF atrophy Interarytenoid Space: Moderate pachydermia&edema Subglottic Space: Patent without lesion or edema Neck Neck and Trachea: Midline trachea without mass or lesion Thyroid : No mass or nodularity Lymphatics: No lymphadenopathy  Procedure: Preoperative diagnosis: dysphonia   Postoperative diagnosis:   Same GERD LPR and PND, VF atrophy  Procedure: Flexible fiberoptic laryngoscopy  Surgeon: Elena Larry, MD  Anesthesia: Topical lidocaine  and Afrin Complications: None Condition is stable throughout exam  Indications and consent:  The patient presents to the clinic with above symptoms. Indirect laryngoscopy view was incomplete. Thus it was recommended that they undergo a flexible fiberoptic laryngoscopy. All of the risks, benefits, and potential complications were reviewed with the patient preoperatively and verbal informed consent was obtained.  Procedure: The patient was seated upright in the clinic. Topical lidocaine  and Afrin were applied to the nasal cavity. After adequate anesthesia had occurred, I then proceeded to pass the flexible telescope into the nasal cavity. The nasal cavity was patent without rhinorrhea or polyp. The nasopharynx was also patent without mass or lesion. The base of tongue was visualized and was normal. There were no signs of pooling of secretions in the piriform sinuses. The true vocal folds were mobile bilaterally. There were no signs of glottic or supraglottic  mucosal lesion or mass. There was moderate interarytenoid pachydermia and post cricoid edema. The telescope was then slowly withdrawn and the patient tolerated the procedure throughout.   Studies Reviewed: CXR 09/23/20 IMPRESSION: Streaky bilateral lung opacities typical of atelectasis. No confluent airspace disease.  Assessment/Plan: Encounter Diagnoses  Name Primary?   Glottic insufficiency Yes   Dysphonia    Hoarseness    Age-related vocal fold atrophy    Uncontrolled type 2 diabetes mellitus with hyperglycemia (HCC)     Assessment and Plan Assessment & Plan Dysphonia chronic Chronic hoarseness, hx of VF nodules and surgery several years ago. Scope exam with VF atrophy but no lesions. She had findings c/w GERD LPR. Possible contribution from GLP-1 medication-induced weight loss. - Refer to speech therapy for breath support and lung optimization. - medical management of GERD LPR and PND  Laryngopharyngeal reflux Reflux changes noted, no heartburn. GLP-1 medication may contribute by slowing GI transit. - Continue omeprazole . -  Reflux Gourmet after meals - diet and lifestyle changes to minimize GERD - Refer to [employer] blog for dietary and lifestyle modifications/reflux cook book  Chronic nasal congestion and PND Allergies to dust and pollen, postnasal drainage may worsen hoarseness. - Prescribed Xyzal  nightly instead of Benadryl . - Prescribed Flonase  nasal spray   Thank you for allowing me to participate in the care of this patient. Please do not hesitate to contact me with any questions or concerns.  Elena Larry, MD Otolaryngology [facility] Phone: [phone] Fax: [phone]    01/22/2024, 11:47 AM

## 2024-01-22 NOTE — Patient Instructions (Signed)

## 2024-01-23 DIAGNOSIS — H35033 Hypertensive retinopathy, bilateral: Secondary | ICD-10-CM | POA: Diagnosis not present

## 2024-01-23 DIAGNOSIS — Z961 Presence of intraocular lens: Secondary | ICD-10-CM | POA: Diagnosis not present

## 2024-01-23 DIAGNOSIS — H16223 Keratoconjunctivitis sicca, not specified as Sjogren's, bilateral: Secondary | ICD-10-CM | POA: Diagnosis not present

## 2024-01-23 DIAGNOSIS — H02413 Mechanical ptosis of bilateral eyelids: Secondary | ICD-10-CM | POA: Diagnosis not present

## 2024-01-23 DIAGNOSIS — E119 Type 2 diabetes mellitus without complications: Secondary | ICD-10-CM | POA: Diagnosis not present

## 2024-01-23 LAB — HM DIABETES EYE EXAM

## 2024-01-26 ENCOUNTER — Ambulatory Visit

## 2024-01-26 ENCOUNTER — Telehealth: Payer: Self-pay | Admitting: *Deleted

## 2024-01-26 NOTE — Telephone Encounter (Signed)
 Pt had AWV scheduled this morning. No answer x 2 attempts.  Left message for pt to call and reschedule. She may be placed on the Wellness Visit 1 schedule at [facility].

## 2024-02-15 ENCOUNTER — Encounter: Payer: Self-pay | Admitting: Family Medicine

## 2024-02-15 DIAGNOSIS — E1165 Type 2 diabetes mellitus with hyperglycemia: Secondary | ICD-10-CM

## 2024-02-16 NOTE — Addendum Note (Signed)
 Addended by: WATT RAISIN C on: 02/16/2024 09:12 AM   Modules accepted: Orders

## 2024-02-20 ENCOUNTER — Other Ambulatory Visit (INDEPENDENT_AMBULATORY_CARE_PROVIDER_SITE_OTHER): Payer: Self-pay | Admitting: Otolaryngology

## 2024-03-08 ENCOUNTER — Ambulatory Visit (INDEPENDENT_AMBULATORY_CARE_PROVIDER_SITE_OTHER): Admitting: *Deleted

## 2024-03-08 ENCOUNTER — Telehealth: Payer: Self-pay | Admitting: *Deleted

## 2024-03-08 VITALS — Ht 62.0 in | Wt 135.0 lb

## 2024-03-08 DIAGNOSIS — G43909 Migraine, unspecified, not intractable, without status migrainosus: Secondary | ICD-10-CM | POA: Diagnosis not present

## 2024-03-08 DIAGNOSIS — Z780 Asymptomatic menopausal state: Secondary | ICD-10-CM | POA: Diagnosis not present

## 2024-03-08 DIAGNOSIS — Z0000 Encounter for general adult medical examination without abnormal findings: Secondary | ICD-10-CM | POA: Diagnosis not present

## 2024-03-08 MED ORDER — RIZATRIPTAN BENZOATE 5 MG PO TBDP: 5.0000 mg | ORAL_TABLET | ORAL | 5 refills | Status: DC | PRN

## 2024-03-08 NOTE — Progress Notes (Addendum)
 Subjective:   Jodi Williams is a 73 y.o. who presents for a Medicare Wellness preventive visit.  As a reminder, Annual Wellness Visits don't include a physical exam, and some assessments may be limited, especially if this visit is performed virtually. We may recommend an in-person follow-up visit with your provider if needed.  Visit Complete: Virtual I connected with  Jodi Williams on 03/08/24 by a audio enabled telemedicine application and verified that I am speaking with the correct person using two identifiers.  Patient Location: Home  Provider Location: Office/Clinic  I discussed the limitations of evaluation and management by telemedicine. The patient expressed understanding and agreed to proceed.  Vital Signs: Because this visit was a virtual/telehealth visit, some criteria may be missing or patient reported. Any vitals not documented were not able to be obtained and vitals that have been documented are patient reported.  VideoDeclined- This patient declined Librarian, academic. Therefore the visit was completed with audio only.  Persons Participating in Visit: Patient.  AWV Questionnaire: No: Patient Medicare AWV questionnaire was not completed prior to this visit.  Cardiac Risk Factors include: advanced age (>31men, >48 women);diabetes mellitus;dyslipidemia;hypertension;Other (see comment), Risk factor comments: asthma     Objective:    Today's Vitals   03/08/24 0901  Weight: 135 lb (61.2 kg)  Height: 5' 2 (1.575 m)   Body mass index is 24.69 kg/m.     03/08/2024    9:23 AM 03/27/2022    8:23 AM 09/22/2020    1:00 PM 09/19/2020    8:15 AM 08/31/2020    1:25 PM 08/30/2019    1:48 PM 09/01/2014    3:27 PM  Advanced Directives  Does Patient Have a Medical Advance Directive? Yes Yes No No Yes;No No No   Type of Estate Agent of [place];Living will Living will       Does patient want to make changes to medical advance  directive? No - Patient declined No - Patient declined No - Patient declined No - Patient declined Yes (MAU/Ambulatory/Procedural Areas - Information given)    Copy of Healthcare Power of Attorney in Chart? No - copy requested        Would patient like information on creating a medical advance directive?   No - Patient declined No - Patient declined  No - Patient declined      Data saved with a previous flowsheet row definition    Current Medications (verified) Outpatient Encounter Medications as of 03/08/2024  Medication Sig   acetaminophen  (TYLENOL ) 650 MG CR tablet Take 650 mg by mouth every 8 (eight) hours as needed for pain.   ALPRAZolam  (XANAX ) 1 MG tablet Take 1 tablet (1 mg total) by mouth at bedtime as needed. for sleep   aspirin  81 MG chewable tablet Chew 1 tablet (81 mg total) by mouth 2 (two) times daily. (Patient taking differently: Chew 81 mg by mouth 2 (two) times daily. Only taking once a day)   Calcium  Carb-Cholecalciferol  (CALCIUM  600 + D PO) Take 2 tablets by mouth at bedtime.   cetirizine (ZYRTEC) 10 MG tablet Take 10 mg by mouth daily.   cholecalciferol  (VITAMIN D3) 25 MCG (1000 UNIT) tablet Take 1,000 Units by mouth at bedtime.   citalopram  (CELEXA ) 20 MG tablet TAKE 1 TABLET BY MOUTH DAILY   fluticasone  (FLONASE ) 50 MCG/ACT nasal spray SHAKE LIQUID AND USE 2 SPRAYS IN EACH NOSTRIL TWICE DAILY   gabapentin  (NEURONTIN ) 300 MG capsule TAKE 1 CAPSULE BY  MOUTH AT  BEDTIME FOR RESTLESS LEGS. MAY  TITRATE UP TO 3 TIMES DAILY  GRADUALLY IF NEEDED   lisinopril -hydrochlorothiazide  (ZESTORETIC ) 10-12.5 MG tablet TAKE 1 TABLET BY MOUTH  DAILY (Patient taking differently: Take 1 tablet by mouth daily. Takes 1/2 tablet daily)   meloxicam  (MOBIC ) 15 MG tablet TAKE 1 TABLET BY MOUTH DAILY   metFORMIN  (GLUCOPHAGE ) 1000 MG tablet TAKE 1 TABLET BY MOUTH TWICE  DAILY WITH MEALS (Patient taking differently: Take 1,000 mg by mouth 2 (two) times daily with a meal. Takes 1 at bedtime)    methocarbamol  (ROBAXIN ) 500 MG tablet Take 1 tablet (500 mg total) by mouth every 8 (eight) hours as needed for muscle spasms.   milk thistle 175 MG tablet Take 350 mg by mouth at bedtime.   Multiple Vitamins-Minerals (WOMENS MULTIVITAMIN PO) Take 1 tablet by mouth at bedtime.   nortriptyline  (PAMELOR ) 10 MG capsule TAKE 2 CAPSULES BY MOUTH AT  BEDTIME   omeprazole  (PRILOSEC) 40 MG capsule TAKE 1 CAPSULE BY MOUTH DAILY   pramipexole  (MIRAPEX ) 0.25 MG tablet TAKE 1 TABLET BY MOUTH IN  THE EVENING AS NEEDED FOR  LEG PAIN. MAY TAKE 2ND  TABLET IF NEEDED   rosuvastatin  (CRESTOR ) 20 MG tablet Take 1 tablet (20 mg total) by mouth daily. (Patient taking differently: Take 20 mg by mouth daily. Takes M,W,Fr)   tirzepatide  (MOUNJARO ) 12.5 MG/0.5ML Pen Inject 12.5 mg into the skin once a week.   vitamin B-12 (CYANOCOBALAMIN ) 1000 MCG tablet Take 1,000 mcg by mouth daily.   vitamin E  180 MG (400 UNITS) capsule Take 400 Units by mouth at bedtime.   [DISCONTINUED] rizatriptan  (MAXALT -MLT) 5 MG disintegrating tablet Take 1 tablet (5 mg total) by mouth as needed. May repeat in 2 hours if needed   ACCU-CHEK GUIDE TEST test strip USE TO CHECK BLOOD SUGAR UP TO  TWICE DAILY AS DIRECTED   glipiZIDE  (GLUCOTROL  XL) 5 MG 24 hr tablet Take 1 tablet (5 mg total) by mouth daily with breakfast. (Patient not taking: Reported on 03/08/2024)   rizatriptan  (MAXALT -MLT) 5 MG disintegrating tablet Take 1 tablet (5 mg total) by mouth as needed. May repeat in 2 hours if needed   [DISCONTINUED] CINNAMON PO Take 2 capsules by mouth at bedtime. (Patient not taking: Reported on 03/08/2024)   [DISCONTINUED] Coenzyme Q10 (CO Q-10) 100 MG CAPS Take 100 mg by mouth at bedtime. (Patient not taking: Reported on 03/08/2024)   [DISCONTINUED] levocetirizine (XYZAL  ALLERGY 24HR) 5 MG tablet Take 1 tablet (5 mg total) by mouth every evening. (Patient not taking: Reported on 03/08/2024)   No facility-administered encounter medications on file as of  03/08/2024.    Allergies (verified) Omnicef [cefdinir]   History: Past Medical History:  Diagnosis Date   Allergy    Anxiety    Arthritis    Asthma    Cataract    COPD (chronic obstructive pulmonary disease) (HCC)    Depression    Diabetes mellitus (HCC)    Hyperlipidemia    Hypertension    PONV (postoperative nausea and vomiting)    Ulcer    Past Surgical History:  Procedure Laterality Date   BACK SURGERY     BREAST SURGERY     COLONOSCOPY WITH ESOPHAGOGASTRODUODENOSCOPY (EGD)     EYE SURGERY     cataract   HAND SURGERY     KNEE SURGERY     TOTAL KNEE REVISION Left 09/21/2020   Procedure: LEFT KNEE REVISION ARTHROPLASTY;  Surgeon: Vernetta Lonni GRADE, MD;  Location: [facility] OR;  Service: Orthopedics;  Laterality: Left;   Family History  Problem Relation Age of Onset   Hyperlipidemia Mother    Hypertension Mother    Heart disease Mother    Dementia Mother    Arthritis Mother    Neuropathy Father    Diabetes Father    Kidney disease Father    Social History   Socioeconomic History   Marital status: Married    Spouse name: Not on file   Number of children: 1   Years of education: Not on file   Highest education level: Not on file  Occupational History   Occupation: Financial trader  Tobacco Use   Smoking status: Never   Smokeless tobacco: Never  Vaping Use   Vaping status: Never Used  Substance and Sexual Activity   Alcohol use: Not Currently    Alcohol/week: 0.0 standard drinks of alcohol   Drug use: No   Sexual activity: Yes    Birth control/protection: None  Other Topics Concern   Not on file  Social History Narrative   Not on file   Social Drivers of Health   Financial Resource Strain: Low Risk  (03/08/2024)   Overall Financial Resource Strain (CARDIA)    Difficulty of Paying Living Expenses: Not hard at all  Food Insecurity: No Food Insecurity (03/08/2024)   Hunger Vital Sign    Worried About Running Out of Food in the Last Year: Never true     Ran Out of Food in the Last Year: Never true  Transportation Needs: No Transportation Needs (03/08/2024)   PRAPARE - Administrator, Civil Service (Medical): No    Lack of Transportation (Non-Medical): No  Physical Activity: Sufficiently Active (03/08/2024)   Exercise Vital Sign    Days of Exercise per Week: 7 days    Minutes of Exercise per Session: 30 min  Stress: No Stress Concern Present (03/08/2024)   [employer] of Occupational Health - Occupational Stress Questionnaire    Feeling of Stress: Not at all  Social Connections: Moderately Integrated (03/08/2024)   Social Connection and Isolation Panel    Frequency of Communication with Friends and Family: More than three times a week    Frequency of Social Gatherings with Friends and Family: Once a week    Attends Religious Services: 1 to 4 times per year    Active Member of [employer] or Organizations: No    Attends Engineer, Structural: Never    Marital Status: Married    Tobacco Counseling Counseling given: Not Answered    Clinical Intake:  Pre-visit preparation completed: Yes  Pain : No/denies pain     BMI - recorded: 24.69 Nutritional Status: BMI of 19-24  Normal Nutritional Risks: None Diabetes: Yes CBG done?: No Did pt. bring in CBG monitor from home?: No  Lab Results  Component Value Date   HGBA1C 7.7 (H) 11/03/2023   HGBA1C 7.0 05/21/2023   HGBA1C 8.0 (H) 12/04/2022     How often do you need to have someone help you when you read instructions, pamphlets, or other written materials from your doctor or pharmacy?: 1 - Never  Interpreter Needed?: No  Information entered by :: Rachid Parham, CMA(AAMA)   Activities of Daily Living     03/08/2024    9:14 AM  In your present state of health, do you have any difficulty performing the following activities:  Hearing? 0  Vision? 0  Difficulty concentrating or making decisions? 0  Walking or  climbing stairs? 1  Comment has had  bilateral knee replacements  Dressing or bathing? 0  Doing errands, shopping? 0  Preparing Food and eating ? N  Using the Toilet? N  In the past six months, have you accidently leaked urine? N  Do you have problems with loss of bowel control? Y  Comment Gets intermittent diarrhea for years  Managing your Medications? N  Managing your Finances? N  Housekeeping or managing your Housekeeping? N    Patient Care Team: Copland, Harlene BROCKS, MD as PCP - General (Family Medicine) Lomax, Carlin ORN, MD (Inactive) as Attending Physician (Obstetrics and Gynecology) Danielle Rom, MD as Consulting Physician (Obstetrics and Gynecology) Marcey Elspeth PARAS, MD as Consulting Physician (Ophthalmology)  I have updated your Care Teams any recent Medical Services you may have received from other providers in the past year.     Assessment:   This is a routine wellness examination for Jodi Williams.  Hearing/Vision screen Hearing Screening - Comments:: Denies hearing difficulties.  Vision Screening - Comments:: Up to date with routine eye exams with Dr [facility]    Goals Addressed   None    Depression Screen     03/08/2024    9:20 AM 11/03/2023    1:20 PM 12/04/2022   11:08 AM 08/21/2022    9:32 AM 03/27/2022    8:24 AM 03/26/2021    1:52 PM 08/31/2020    1:29 PM  PHQ 2/9 Scores  PHQ - 2 Score 1 4 2 2  0 2 0  PHQ- 9 Score 4 14 3 8  8      Fall Risk     03/08/2024    9:24 AM 11/03/2023    1:20 PM 12/04/2022   10:48 AM 08/21/2022    9:32 AM 03/27/2022    8:23 AM  Fall Risk   Falls in the past year? 1 1 0 0 0  Comment fell off of step of the RV while cleaning it. Tripped in sand, walking on beach      Number falls in past yr: 1 1 0 0 0  Injury with Fall? 0 0 0 0 0  Risk for fall due to : Orthopedic patient History of fall(s) No Fall Risks No Fall Risks No Fall Risks  Follow up Education provided Falls evaluation completed Falls evaluation completed Falls evaluation completed Falls  evaluation completed      Data saved with a previous flowsheet row definition    MEDICARE RISK AT HOME:  Medicare Risk at Home Any stairs in or around the home?: Yes If so, are there any without handrails?: No Home free of loose throw rugs in walkways, pet beds, electrical cords, etc?: Yes Adequate lighting in your home to reduce risk of falls?: Yes Life alert?: No Use of a cane, walker or w/c?: No Grab bars in the bathroom?: Yes Shower chair or bench in shower?: Yes (doesn't need it) Elevated toilet seat or a handicapped toilet?: No  TIMED UP AND GO:  Was the test performed?  No, audio  Cognitive Function: 6CIT completed        03/08/2024    9:25 AM 03/27/2022    8:32 AM  6CIT Screen  What Year? 0 points 0 points  What month? 0 points 0 points  What time? 0 points 0 points  Count back from 20 0 points 0 points  Months in reverse 0 points 0 points  Repeat phrase 0 points 2 points  Total Score 0 points 2 points  Immunizations Immunization History  Administered Date(s) Administered   Fluad Quad(high Dose 65+) 06/12/2020, 03/26/2021, 01/17/2022   Influenza, Seasonal, Injecte, Preservative Fre 04/12/2012   Influenza,inj,Quad PF,6+ Mos 04/04/2013, 01/24/2014, 01/18/2015, 03/28/2016, 04/07/2017, 03/18/2018   Influenza,inj,quad, With Preservative 03/13/2017   Influenza-Unspecified 03/26/2021   Pneumococcal Conjugate-13 12/03/2017   Pneumococcal Polysaccharide-23 05/16/2014, 09/08/2019   Td 12/03/2017, 12/03/2017   Tdap 10/14/2007   Zoster, Live 10/18/2015    Screening Tests Health Maintenance  Topic Date Due   Zoster Vaccines- Shingrix (1 of 2) 06/21/2002   Fecal DNA (Cologuard)  06/29/2023   FOOT EXAM  08/21/2023   Influenza Vaccine  12/12/2023   COVID-19 Vaccine (1 - 2025-26 season) 03/08/2025 (Originally 01/12/2024)   HEMOGLOBIN A1C  05/04/2024   Mammogram  09/03/2024   Diabetic kidney evaluation - eGFR measurement  11/02/2024   Diabetic kidney evaluation -  Urine ACR  11/02/2024   OPHTHALMOLOGY EXAM  01/22/2025   Medicare Annual Wellness (AWV)  03/08/2025   DTaP/Tdap/Td (4 - Td or Tdap) 12/04/2027   Pneumococcal Vaccine: 50+ Years  Completed   DEXA SCAN  Completed   Hepatitis C Screening  Completed   Meningococcal B Vaccine  Aged Out    Health Maintenance Items Addressed: Has cologuard kit at home, just needs to complete it. Will get flu vaccine at pharmacy. Declines Covid and Shingles vaccines at this time. DEXA ordered. Will need to do DM foot exam at next OV.  Additional Screening:  Vision Screening: Recommended annual ophthalmology exams for early detection of glaucoma and other disorders of the eye. Is the patient up to date with their annual eye exam?  Yes  Who is the provider or what is the name of the office in which the patient attends annual eye exams? [facility] /Dr Marcey  Dental Screening: Recommended annual dental exams for proper oral hygiene  Community Resource Referral / Chronic Care Management: CRR required this visit?  No   CCM required this visit?  No   Plan:    I have personally reviewed and noted the following in the patient's chart:   Medical and social history Use of alcohol, tobacco or illicit drugs  Current medications and supplements including opioid prescriptions. Patient is not currently taking opioid prescriptions. Functional ability and status Nutritional status Physical activity Advanced directives List of other physicians Hospitalizations, surgeries, and ER visits in previous 12 months Vitals Screenings to include cognitive, depression, and falls Referrals and appointments  In addition, I have reviewed and discussed with patient certain preventive protocols, quality metrics, and best practice recommendations. A written personalized care plan for preventive services as well as general preventive health recommendations were provided to patient.   Lolita Libra, CMA   03/08/2024   After  Visit Summary: (MyChart) Due to this being a telephonic visit, the after visit summary with patients personalized plan was offered to patient via MyChart   Notes: see phone note

## 2024-03-08 NOTE — Telephone Encounter (Signed)
 Pt had AWV today.  Reports 2 falls in the last year and questions her balance.   2. There are several medications on her list that she is taking differently than prescribed and she says you are aware. 3.  She is also due for a DM foot exam at her next OV.  I have scheduled her a follow up with you on 03/25/24.

## 2024-03-08 NOTE — Patient Instructions (Addendum)
 Jodi Williams , Thank you for taking time out of your busy schedule to complete your Annual Wellness Visit with me. I enjoyed our conversation and look forward to speaking with you again next year. I, as well as your care team,  appreciate your ongoing commitment to your health goals. Please review the following plan we discussed and let me know if I can assist you in the future. Your Game plan/ To Do List    Referrals: If you haven't heard from the office you've been referred to, please reach out to them at the phone provided.   Bone Density ([facility]):  [phone]  Follow up Visits: Next Medicare AWV with our clinical staff: 03/09/25 9am, telephone    Next Office Visit with your provider: 03/25/24 3:20pm, Dr Watt  Clinician Recommendations:  Aim for 30 minutes of exercise or brisk walking, 6-8 glasses of water, and 5 servings of fruits and vegetables each day.   You will need to get the following vaccines at your local pharmacy: Flu      This is a list of the screening recommended for you and due dates:  Health Maintenance  Topic Date Due   Zoster (Shingles) Vaccine (1 of 2) 06/21/2002   Medicare Annual Wellness Visit  03/28/2023   Cologuard (Stool DNA test)  06/29/2023   Complete foot exam   08/21/2023   Flu Shot  12/12/2023   COVID-19 Vaccine (1 - 2025-26 season) 03/08/2025*   Hemoglobin A1C  05/04/2024   Breast Cancer Screening  09/03/2024   Yearly kidney function blood test for diabetes  11/02/2024   Yearly kidney health urinalysis for diabetes  11/02/2024   Eye exam for diabetics  01/22/2025   DTaP/Tdap/Td vaccine (4 - Td or Tdap) 12/04/2027   Pneumococcal Vaccine for age over 55  Completed   DEXA scan (bone density measurement)  Completed   Hepatitis C Screening  Completed   Meningitis B Vaccine  Aged Out  *Topic was postponed. The date shown is not the original due date.    Advanced directives: (Copy Requested) Please bring a copy of your health care  power of attorney and living will to the office to be added to your chart at your convenience. You can mail to [facility] [address] [place], [place] [address] or email to [email or web address] Advance Care Planning is important because it:  [x]  Makes sure you receive the medical care that is consistent with your values, goals, and preferences  [x]  It provides guidance to your family and loved ones and reduces their decisional burden about whether or not they are making the right decisions based on your wishes.  Follow the link provided in your after visit summary or read over the paperwork we have mailed to you to help you started getting your Advance Directives in place. If you need assistance in completing these, please reach out to us  so that we can help you!  See attachments for Preventive Care and Fall Prevention Tips.

## 2024-03-17 LAB — COLOGUARD: COLOGUARD: NEGATIVE

## 2024-03-23 NOTE — Progress Notes (Unsigned)
 [facility] [address] [place], [place] [address] [phone]  Date:  03/25/2024   Name:  Jodi Williams   DOB:  Sep 09, 1950   MRN:  [id number]  PCP:  Watt Harlene BROCKS, MD    Chief Complaint: No chief complaint on file.   History of Present Illness:  Jodi Williams is a 73 y.o. very pleasant female patient who presents with the following:  Patient seen today for follow-up-I saw her most recently in June -history of hypertension, diabetes, depression, asthma, hyperlipidemia, arthritis status post knee replacement, melanoma  At our last visit she was concerned about hoarseness for several months, she was seen by ENT in September-examination was benign They thought a GLP-1 medication and slow transit could be contributing to GERD symptoms Patient does live a lot of the time and will be [place]  and have requested some lab orders to have done at [facility] there  She is due for foot exam She has concern about a couple of falls recently Recommend Shingrix Recommend flu shot She has declined COVID vaccination Mammogram is up-to-date Cologuard just completed, negative  Lab Results  Component Value Date   HGBA1C 7.7 (H) 11/03/2023     Discussed the use of AI scribe software for clinical note transcription with the patient, who gave verbal consent to proceed.  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/21/2020   History of partial knee replacement 11/03/2017   Chronic pain of left knee 11/03/2017   Chronic migraine 03/31/2017   Preop cardiovascular exam 07/12/2015   Dyspnea 07/12/2015   Chest pain 07/12/2015   Restless legs 05/24/2015   Diabetes (HCC) 08/24/2013   Other and unspecified hyperlipidemia  09/07/2012   HTN (hypertension) 04/12/2012   OA (osteoarthritis) of knee 04/12/2012   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   Depression with anxiety 04/12/2012   H/O gastric ulcer 04/12/2012   ALLERGIC RHINITIS WITH CONJUNCTIVITIS 09/08/2007   ASTHMA 07/20/2007    Past Medical History:  Diagnosis Date   Allergy    Anxiety    Arthritis    Asthma    Cataract    COPD (chronic obstructive pulmonary disease) (HCC)    Depression    Diabetes mellitus (HCC)    Hyperlipidemia    Hypertension    PONV (postoperative nausea and vomiting)    Ulcer     Past Surgical History:  Procedure Laterality Date   BACK SURGERY     BREAST SURGERY     COLONOSCOPY WITH ESOPHAGOGASTRODUODENOSCOPY (EGD)     EYE SURGERY     cataract   HAND SURGERY     KNEE SURGERY     TOTAL KNEE REVISION Left 09/21/2020   Procedure: LEFT KNEE REVISION ARTHROPLASTY;  Surgeon: Vernetta Lonni GRADE, MD;  Location: [facility] OR;  Service: Orthopedics;  Laterality: Left;    Social History   Tobacco Use   Smoking status: Never   Smokeless tobacco: Never  Vaping Use   Vaping status: Never Used  Substance Use Topics   Alcohol use: Not Currently    Alcohol/week: 0.0 standard drinks of alcohol   Drug use: No    Family History  Problem Relation Age of Onset   Hyperlipidemia Mother    Hypertension Mother  Heart disease Mother    Dementia Mother    Arthritis Mother    Neuropathy Father    Diabetes Father    Kidney disease Father     Allergies  Allergen Reactions   Omnicef [Cefdinir] Rash    Medication list has been reviewed and updated.  Current Outpatient Medications on File Prior to Visit  Medication Sig Dispense Refill   ACCU-CHEK GUIDE TEST test strip USE TO CHECK BLOOD SUGAR UP TO  TWICE DAILY AS DIRECTED 200 strip 3   acetaminophen  (TYLENOL ) 650 MG CR tablet Take 650 mg by mouth every 8 (eight) hours as needed for pain.     ALPRAZolam  (XANAX ) 1 MG tablet Take 1 tablet (1 mg total) by mouth at bedtime  as needed. for sleep 90 tablet 1   aspirin  81 MG chewable tablet Chew 1 tablet (81 mg total) by mouth 2 (two) times daily. (Patient taking differently: Chew 81 mg by mouth 2 (two) times daily. Only taking once a day) 60 tablet 0   Calcium  Carb-Cholecalciferol  (CALCIUM  600 + D PO) Take 2 tablets by mouth at bedtime.     cetirizine (ZYRTEC) 10 MG tablet Take 10 mg by mouth daily.     cholecalciferol  (VITAMIN D3) 25 MCG (1000 UNIT) tablet Take 1,000 Units by mouth at bedtime.     citalopram  (CELEXA ) 20 MG tablet TAKE 1 TABLET BY MOUTH DAILY 90 tablet 3   fluticasone  (FLONASE ) 50 MCG/ACT nasal spray SHAKE LIQUID AND USE 2 SPRAYS IN EACH NOSTRIL TWICE DAILY 96 g 0   gabapentin  (NEURONTIN ) 300 MG capsule TAKE 1 CAPSULE BY MOUTH AT  BEDTIME FOR RESTLESS LEGS. MAY  TITRATE UP TO 3 TIMES DAILY  GRADUALLY IF NEEDED 270 capsule 3   glipiZIDE  (GLUCOTROL  XL) 5 MG 24 hr tablet Take 1 tablet (5 mg total) by mouth daily with breakfast. (Patient not taking: Reported on 03/08/2024) 90 tablet 3   lisinopril -hydrochlorothiazide  (ZESTORETIC ) 10-12.5 MG tablet TAKE 1 TABLET BY MOUTH  DAILY (Patient taking differently: Take 1 tablet by mouth daily. Takes 1/2 tablet daily) 90 tablet 3   meloxicam  (MOBIC ) 15 MG tablet TAKE 1 TABLET BY MOUTH DAILY 90 tablet 3   metFORMIN  (GLUCOPHAGE ) 1000 MG tablet TAKE 1 TABLET BY MOUTH TWICE  DAILY WITH MEALS (Patient taking differently: Take 1,000 mg by mouth 2 (two) times daily with a meal. Takes 1 at bedtime) 180 tablet 3   methocarbamol  (ROBAXIN ) 500 MG tablet Take 1 tablet (500 mg total) by mouth every 8 (eight) hours as needed for muscle spasms. 40 tablet 1   milk thistle 175 MG tablet Take 350 mg by mouth at bedtime.     Multiple Vitamins-Minerals (WOMENS MULTIVITAMIN PO) Take 1 tablet by mouth at bedtime.     nortriptyline  (PAMELOR ) 10 MG capsule TAKE 2 CAPSULES BY MOUTH AT  BEDTIME 180 capsule 3   omeprazole  (PRILOSEC) 40 MG capsule TAKE 1 CAPSULE BY MOUTH DAILY 90 capsule 3    pramipexole  (MIRAPEX ) 0.25 MG tablet TAKE 1 TABLET BY MOUTH IN  THE EVENING AS NEEDED FOR  LEG PAIN. MAY TAKE 2ND  TABLET IF NEEDED 180 tablet 3   rizatriptan  (MAXALT -MLT) 5 MG disintegrating tablet Take 1 tablet (5 mg total) by mouth as needed. May repeat in 2 hours if needed 15 tablet 5   rosuvastatin  (CRESTOR ) 20 MG tablet Take 1 tablet (20 mg total) by mouth daily. (Patient taking differently: Take 20 mg by mouth daily. Takes M,W,Fr) 90 tablet 3   tirzepatide  (  MOUNJARO ) 12.5 MG/0.5ML Pen Inject 12.5 mg into the skin once a week. 6 mL 1   vitamin B-12 (CYANOCOBALAMIN ) 1000 MCG tablet Take 1,000 mcg by mouth daily.     vitamin E  180 MG (400 UNITS) capsule Take 400 Units by mouth at bedtime.     No current facility-administered medications on file prior to visit.    Review of Systems:  As per HPI- otherwise negative.   Physical Examination: There were no vitals filed for this visit. There were no vitals filed for this visit. There is no height or weight on file to calculate BMI. Ideal Body Weight:    GEN: no acute distress. HEENT: Atraumatic, Normocephalic.  Ears and Nose: No external deformity. CV: RRR, No M/G/R. No JVD. No thrill. No extra heart sounds. PULM: CTA B, no wheezes, crackles, rhonchi. No retractions. No resp. distress. No accessory muscle use. ABD: S, NT, ND, +BS. No rebound. No HSM. EXTR: No c/c/e PSYCH: Normally interactive. Conversant.    Assessment and Plan: No diagnosis found.  Assessment & Plan   Signed Harlene Schroeder, MD

## 2024-03-23 NOTE — Patient Instructions (Incomplete)
 It was good to see you today Recommend immunizations: 1 dose of RSV, COVID series, Shingrix

## 2024-03-25 ENCOUNTER — Ambulatory Visit: Admitting: Family Medicine

## 2024-03-25 VITALS — BP 122/62 | HR 75 | Ht 62.0 in | Wt 134.4 lb

## 2024-03-25 DIAGNOSIS — E119 Type 2 diabetes mellitus without complications: Secondary | ICD-10-CM | POA: Diagnosis not present

## 2024-03-25 DIAGNOSIS — G43909 Migraine, unspecified, not intractable, without status migrainosus: Secondary | ICD-10-CM | POA: Diagnosis not present

## 2024-03-25 DIAGNOSIS — E1165 Type 2 diabetes mellitus with hyperglycemia: Secondary | ICD-10-CM | POA: Diagnosis not present

## 2024-03-25 DIAGNOSIS — Z23 Encounter for immunization: Secondary | ICD-10-CM

## 2024-03-25 DIAGNOSIS — E785 Hyperlipidemia, unspecified: Secondary | ICD-10-CM

## 2024-03-25 MED ORDER — RIZATRIPTAN BENZOATE 5 MG PO TBDP: 5.0000 mg | ORAL_TABLET | ORAL | 5 refills | Status: AC | PRN

## 2024-03-26 ENCOUNTER — Encounter: Payer: Self-pay | Admitting: Family Medicine

## 2024-03-26 LAB — BASIC METABOLIC PANEL WITH GFR
BUN: 21 mg/dL (ref 6–23)
CO2: 28 meq/L (ref 19–32)
Calcium: 10.2 mg/dL (ref 8.4–10.5)
Chloride: 97 meq/L (ref 96–112)
Creatinine, Ser: 1.17 mg/dL (ref 0.40–1.20)
GFR: 46.86 mL/min — ABNORMAL LOW (ref >60.00)
Glucose, Bld: 106 mg/dL — ABNORMAL HIGH (ref 70–99)
Potassium: 4.1 meq/L (ref 3.5–5.1)
Sodium: 132 meq/L — ABNORMAL LOW (ref 135–145)

## 2024-03-26 LAB — HEMOGLOBIN A1C: Hgb A1c MFr Bld: 6.2 % (ref 4.6–6.5)

## 2024-03-31 ENCOUNTER — Other Ambulatory Visit: Payer: Self-pay | Admitting: Family Medicine

## 2024-04-07 ENCOUNTER — Ambulatory Visit: Admitting: Orthopaedic Surgery

## 2024-04-12 ENCOUNTER — Other Ambulatory Visit (HOSPITAL_BASED_OUTPATIENT_CLINIC_OR_DEPARTMENT_OTHER)

## 2024-04-21 ENCOUNTER — Other Ambulatory Visit (INDEPENDENT_AMBULATORY_CARE_PROVIDER_SITE_OTHER): Payer: Self-pay | Admitting: Otolaryngology

## 2024-04-27 ENCOUNTER — Other Ambulatory Visit: Payer: Self-pay | Admitting: Family Medicine

## 2024-04-27 DIAGNOSIS — E1165 Type 2 diabetes mellitus with hyperglycemia: Secondary | ICD-10-CM

## 2024-04-27 MED ORDER — TIRZEPATIDE 12.5 MG/0.5ML ~~LOC~~ SOAJ: 12.5000 mg | SUBCUTANEOUS | 1 refills | Status: AC

## 2024-04-27 NOTE — Telephone Encounter (Signed)
 Copied from CRM #[id number]. Topic: Clinical - Medication Refill >> Apr 27, 2024  8:35 AM Aleatha C wrote: Medication:  tirzepatide  (MOUNJARO ) 12.5 MG/0.5ML Pen    Has the patient contacted their pharmacy? Yes (Agent: If no, request that the patient contact the pharmacy for the refill. If patient does not wish to contact the pharmacy document the reason why and proceed with request.) (Agent: If yes, when and what did the pharmacy advise?)  This is the patient's preferred pharmacy:  [id number] [place], [place] - [address] AT [facility] [address] [place] [address] Phone: [phone] Fax: [phone]   Is this the correct pharmacy for this prescription? Yes If no, delete pharmacy and type the correct one.   Has the prescription been filled recently? No  Is the patient out of the medication? Yes  Has the patient been seen for an appointment in the last year OR does the patient have an upcoming appointment? Yes  Can we respond through MyChart? No  Agent: Please be advised that Rx refills may take up to 3 business days. We ask that you follow-up with your pharmacy.

## 2024-05-02 ENCOUNTER — Other Ambulatory Visit (INDEPENDENT_AMBULATORY_CARE_PROVIDER_SITE_OTHER): Payer: Self-pay | Admitting: Otolaryngology

## 2024-05-04 ENCOUNTER — Ambulatory Visit (HOSPITAL_BASED_OUTPATIENT_CLINIC_OR_DEPARTMENT_OTHER)
Admission: RE | Admit: 2024-05-04 | Discharge: 2024-05-04 | Disposition: A | Discharge status: Home / Self Care | Source: Ambulatory Visit | Attending: Family Medicine | Admitting: Family Medicine

## 2024-05-04 ENCOUNTER — Encounter: Payer: Self-pay | Admitting: Family Medicine

## 2024-05-04 DIAGNOSIS — Z780 Asymptomatic menopausal state: Secondary | ICD-10-CM | POA: Insufficient documentation

## 2024-05-05 ENCOUNTER — Other Ambulatory Visit: Payer: Self-pay | Admitting: Family Medicine

## 2024-05-05 DIAGNOSIS — G2581 Restless legs syndrome: Secondary | ICD-10-CM

## 2024-05-08 ENCOUNTER — Other Ambulatory Visit: Payer: Self-pay | Admitting: Family Medicine

## 2024-05-08 DIAGNOSIS — F418 Other specified anxiety disorders: Secondary | ICD-10-CM

## 2024-05-14 ENCOUNTER — Other Ambulatory Visit: Payer: Self-pay | Admitting: Family Medicine

## 2024-05-14 DIAGNOSIS — G8929 Other chronic pain: Secondary | ICD-10-CM

## 2024-05-14 NOTE — Telephone Encounter (Signed)
 Copied from CRM [id number]. Topic: Clinical - Medication Refill >> May 14, 2024 12:08 PM Emylou G wrote: Medication: methocarbamol  (ROBAXIN ) 500 MG tablet  Has the patient contacted their pharmacy? No (Agent: If no, request that the patient contact the pharmacy for the refill. If patient does not wish to contact the pharmacy document the reason why and proceed with request.) (Agent: If yes, when and what did the pharmacy advise?)  This is the patient's preferred pharmacy:  [facility] #[id number] [place], [place] - [address] AT [facility] [address] [place] [address] Phone: [phone] Fax: [phone]  Is this the correct pharmacy for this prescription? yes If no, delete pharmacy and type the correct one.   Has the prescription been filled recently? No  Is the patient out of the medication? Yes  Has the patient been seen for an appointment in the last year OR does the patient have an upcoming appointment? Yes  Can we respond through MyChart? Yes  Agent: Please be advised that Rx refills may take up to 3 business days. We ask that you follow-up with your pharmacy.

## 2024-05-17 MED ORDER — METHOCARBAMOL 500 MG PO TABS: 500.0000 mg | ORAL_TABLET | Freq: Three times a day (TID) | ORAL | 1 refills | Status: AC | PRN

## 2024-05-21 ENCOUNTER — Other Ambulatory Visit: Payer: Self-pay | Admitting: Family Medicine

## 2024-05-21 DIAGNOSIS — F418 Other specified anxiety disorders: Secondary | ICD-10-CM

## 2024-05-22 ENCOUNTER — Other Ambulatory Visit (INDEPENDENT_AMBULATORY_CARE_PROVIDER_SITE_OTHER): Payer: Self-pay | Admitting: Otolaryngology

## 2024-06-01 ENCOUNTER — Other Ambulatory Visit: Payer: Self-pay | Admitting: Family Medicine

## 2024-06-01 DIAGNOSIS — R1013 Epigastric pain: Secondary | ICD-10-CM

## 2024-06-10 ENCOUNTER — Telehealth: Payer: Self-pay | Admitting: Family Medicine

## 2024-06-10 NOTE — Telephone Encounter (Signed)
 Copied from CRM [id number]. Topic: General - Other >> Jun 10, 2024  4:17 PM Viola F wrote: Reason for CRM: Pateint needs letter submitted to [facility] regarding the reason why she is taking the Mounjaro  medication. She doesn't have the fax number but [employer]'s phone number is [phone].

## 2024-06-11 ENCOUNTER — Telehealth: Payer: Self-pay

## 2024-06-11 ENCOUNTER — Other Ambulatory Visit (HOSPITAL_COMMUNITY): Payer: Self-pay

## 2024-06-11 NOTE — Telephone Encounter (Signed)
 Pharmacy Patient Advocate Encounter   Received notification from Physician's Office that prior authorization for Mounjaro  is required/requested.   Insurance verification completed.   The patient is insured through [employer].   Per test claim: The current 28 day co-pay is, $259.39.  No PA needed at this time. This test claim was processed through [facility]- copay amounts may vary at other pharmacies due to pharmacy/plan contracts, or as the patient moves through the different stages of their insurance plan.     Pt. Has a deductible to meet, should meet deductible after first fill

## 2024-06-11 NOTE — Telephone Encounter (Signed)
 Spoke with patient and she needs a prior authorization for Mounjaro .  Please run prior auth.

## 2024-06-15 NOTE — Telephone Encounter (Signed)
 Spoke with patient about this and she advised me that it was for her husband.   I advised her that I will get them to run prior auth for him.  I did talk with her last week and it was about hers.

## 2024-06-16 ENCOUNTER — Other Ambulatory Visit: Payer: Self-pay | Admitting: Family Medicine

## 2024-06-30 ENCOUNTER — Ambulatory Visit: Admitting: Orthopaedic Surgery

## 2025-03-09 ENCOUNTER — Ambulatory Visit
